# Patient Record
Sex: Male | Born: 1970
Health system: Southern US, Community
[De-identification: ages and names within clinical notes are randomized; demographics above are authoritative.]

## PROBLEM LIST (undated history)

## (undated) DIAGNOSIS — I509 Heart failure, unspecified: Secondary | ICD-10-CM

---

## 2014-07-13 ENCOUNTER — Encounter (HOSPITAL_COMMUNITY): Payer: Self-pay

## 2014-07-13 ENCOUNTER — Emergency Department (HOSPITAL_COMMUNITY): Payer: Self-pay

## 2014-07-13 ENCOUNTER — Encounter (HOSPITAL_COMMUNITY): Payer: Self-pay | Admitting: Emergency Medicine

## 2014-07-13 ENCOUNTER — Emergency Department (INDEPENDENT_AMBULATORY_CARE_PROVIDER_SITE_OTHER)
Admission: EM | Admit: 2014-07-13 | Discharge: 2014-07-13 | Disposition: A | Discharge status: Nursing Home (Includes ICF) | Payer: Self-pay | Source: Home / Self Care | Attending: Emergency Medicine | Admitting: Emergency Medicine

## 2014-07-13 ENCOUNTER — Emergency Department (HOSPITAL_COMMUNITY)
Admission: EM | Admit: 2014-07-13 | Discharge: 2014-07-14 | Disposition: A | Discharge status: Home / Self Care | Payer: Self-pay | Attending: Emergency Medicine | Admitting: Emergency Medicine

## 2014-07-13 DIAGNOSIS — R079 Chest pain, unspecified: Secondary | ICD-10-CM | POA: Insufficient documentation

## 2014-07-13 DIAGNOSIS — R42 Dizziness and giddiness: Secondary | ICD-10-CM | POA: Insufficient documentation

## 2014-07-13 DIAGNOSIS — R0602 Shortness of breath: Secondary | ICD-10-CM

## 2014-07-13 DIAGNOSIS — R0789 Other chest pain: Secondary | ICD-10-CM

## 2014-07-13 DIAGNOSIS — R002 Palpitations: Secondary | ICD-10-CM | POA: Insufficient documentation

## 2014-07-13 LAB — I-STAT TROPONIN, ED: Troponin i, poc: 0.02 ng/mL (ref 0.00–0.08)

## 2014-07-13 LAB — I-STAT CHEM 8, ED
BUN: 21 mg/dL (ref 6–23)
CALCIUM ION: 1.13 mmol/L (ref 1.12–1.23)
Chloride: 103 mmol/L (ref 96–112)
Creatinine, Ser: 1.1 mg/dL (ref 0.50–1.35)
Glucose, Bld: 106 mg/dL — ABNORMAL HIGH (ref 70–99)
HCT: 40 % (ref 39.0–52.0)
HEMOGLOBIN: 13.6 g/dL (ref 13.0–17.0)
Potassium: 3.8 mmol/L (ref 3.5–5.1)
Sodium: 141 mmol/L (ref 135–145)
TCO2: 23 mmol/L (ref 0–100)

## 2014-07-13 LAB — CBC WITH DIFFERENTIAL/PLATELET
BASOS PCT: 0 % (ref 0–1)
Basophils Absolute: 0 10*3/uL (ref 0.0–0.1)
EOS ABS: 0.2 10*3/uL (ref 0.0–0.7)
Eosinophils Relative: 3 % (ref 0–5)
HEMATOCRIT: 40.5 % (ref 39.0–52.0)
Hemoglobin: 13.6 g/dL (ref 13.0–17.0)
Lymphocytes Relative: 26 % (ref 12–46)
Lymphs Abs: 1.8 10*3/uL (ref 0.7–4.0)
MCH: 28.5 pg (ref 26.0–34.0)
MCHC: 33.6 g/dL (ref 30.0–36.0)
MCV: 84.9 fL (ref 78.0–100.0)
MONO ABS: 0.5 10*3/uL (ref 0.1–1.0)
Monocytes Relative: 8 % (ref 3–12)
Neutro Abs: 4.4 10*3/uL (ref 1.7–7.7)
Neutrophils Relative %: 63 % (ref 43–77)
Platelets: 189 10*3/uL (ref 150–400)
RBC: 4.77 MIL/uL (ref 4.22–5.81)
RDW: 12.8 % (ref 11.5–15.5)
WBC: 6.8 10*3/uL (ref 4.0–10.5)

## 2014-07-13 LAB — HEPATIC FUNCTION PANEL
ALBUMIN: 3.9 g/dL (ref 3.5–5.2)
ALT: 18 U/L (ref 0–53)
AST: 15 U/L (ref 0–37)
Alkaline Phosphatase: 37 U/L — ABNORMAL LOW (ref 39–117)
Bilirubin, Direct: <0.1 mg/dL (ref 0.0–0.5)
Total Bilirubin: 0.7 mg/dL (ref 0.3–1.2)
Total Protein: 7.1 g/dL (ref 6.0–8.3)

## 2014-07-13 NOTE — ED Notes (Signed)
Per EMS: Pt from urgent care, began experiencing chest pain this morning ~10am. States that it starts in his LLQ abdominally and extends through chest and into neck. Complaining of 3/10 chest tightness at this time. No history. BP 148/96, 81bpm, sats 100% on RA.

## 2014-07-13 NOTE — ED Provider Notes (Signed)
CSN: [id number]     Arrival date & time 07/13/14  1946 History   First MD Initiated Contact with Patient 07/13/14 2002     Chief Complaint  Patient presents with  . Chest Pain     (Consider location/radiation/quality/duration/timing/severity/associated sxs/prior Treatment) HPI Comments: This is a 44 year old male with no past medical history who presents with 2 episodes of left chest pain that starts in the left upper quadrant, radiates into his left chest and into his neck.  He states it is a tightness during the episode which lasted approximately one hour.  He felt dizzy and short of breath.  He was not nauseated or diaphoretic.  The first episode occurred at 10 AM it lasted approximately one hour.  It resolved after he ate a meal.  Second episode occurred between 3 and 4 lasted approximately one hour and resolved spontaneously after one hour.  At this time is not having any pain He did go to urgent care who did an EKG and sent patient to the ED for further evaluation. Patient's family medical Street consists of father with hypertension.  Mother with low blood pressure. Patient states he has not traveled recently.  He is a former smoker, quit over a year ago.  Has not traveled, has not had swelling of his lower legs.  Patient is a 44 y.o. male presenting with chest pain. The history is provided by the patient.  Chest Pain Pain location:  L chest Pain quality: tightness   Radiates to: neck. Pain radiates to the back: no   Pain severity:  Moderate Onset quality:  Gradual Duration:  1 hour Timing:  Intermittent Progression:  Resolved Chronicity:  Recurrent Relieved by:  Rest Worsened by:  Nothing tried Ineffective treatments:  None tried Associated symptoms: dizziness, palpitations and shortness of breath   Associated symptoms: no back pain, no cough, no diaphoresis, no headache, no heartburn, no nausea, no near-syncope, no numbness and no weakness   Shortness of breath:    Severity:   Mild   Onset quality:  Unable to specify   Duration:  1 hour   Timing:  Intermittent   Progression:  Resolved Risk factors: male sex   Risk factors: no aortic disease, no coronary artery disease, no diabetes mellitus, no high cholesterol, no hypertension, no immobilization, no Marfan's syndrome, not obese, no prior DVT/PE, no smoking and no surgery     History reviewed. No pertinent past medical history. History reviewed. No pertinent past surgical history. History reviewed. No pertinent family history. History  Substance Use Topics  . Smoking status: Former Research scientist (life sciences)  . Smokeless tobacco: Not on file  . Alcohol Use: No    Review of Systems  Constitutional: Negative for diaphoresis.  Respiratory: Positive for shortness of breath. Negative for cough.   Cardiovascular: Positive for chest pain and palpitations. Negative for near-syncope.  Gastrointestinal: Negative for heartburn and nausea.  Musculoskeletal: Negative for back pain.  Skin: Negative for rash.  Neurological: Positive for dizziness. Negative for weakness, numbness and headaches.  All other systems reviewed and are negative.     Allergies  Review of patient's allergies indicates no known allergies.  Home Medications   Prior to Admission medications   Not on File   BP 97/59 mmHg  Pulse 89  Temp(Src) 98.3 F (36.8 C) (Oral)  Resp 21  SpO2 95% Physical Exam  Constitutional: He appears well-developed and well-nourished.  HENT:  Head: Normocephalic.  Eyes: Pupils are equal, round, and reactive to light.  Neck:  Normal range of motion.  Cardiovascular: Normal rate and regular rhythm.   Pulmonary/Chest: Effort normal.  Musculoskeletal: Normal range of motion.  Neurological: He is alert.  Skin: Skin is warm.  Nursing note and vitals reviewed.   ED Course  Procedures (including critical care time) Labs Review Labs Reviewed  HEPATIC FUNCTION PANEL - Abnormal; Notable for the following:    Alkaline  Phosphatase 37 (*)    All other components within normal limits  I-STAT CHEM 8, ED - Abnormal; Notable for the following:    Glucose, Bld 106 (*)    All other components within normal limits  CBC WITH DIFFERENTIAL/PLATELET  Randolm Idol, ED  Randolm Idol, ED    Imaging Review Dg Chest 2 View  07/13/2014   CLINICAL DATA:  Shortness of breath, left-sided chest pressure. Former smoker.  EXAM: CHEST  2 VIEW  COMPARISON:  None.  FINDINGS: Enlarged cardiac silhouette. Normal mediastinal contours the lungs are hyperexpanded with diffuse slightly nodular thickening of the pulmonary interstitium. No discrete focal airspace opacities. No pleural effusion or pneumothorax. No evidence of edema. No acute osseus abnormalities. Mild scoliotic curvature of the thoracolumbar spine, presumably positional.  IMPRESSION: Cardiomegaly and lung hyperexpansion without acute cardiopulmonary disease.   Electronically Signed   By: Sandi Mariscal M.D.   On: 07/13/2014 20:59     EKG Interpretation None     ED ECG REPORT   Date: 07/13/2014  Rate: 90  Rhythm: normal sinus rhythm  QRS Axis: right  Intervals: normal  ST/T Wave abnormalities: normal  Conduction Disutrbances:none  Narrative Interpretation: LVH   Old EKG Reviewed: none available  I have personally reviewed the EKG tracing and agree with the computerized printout as noted. Heart score   3 [facility] states they heard a heart  murmer  Patient has had 2 sets of negative cardiac markers, EKG has been unchanged.  Chest x-ray is within normal parameters.  He has been referred to the [facility] to help establish a primary care physician MDM   Final diagnoses:  Short of breath on exertion  Chest pain of uncertain etiology         Junius Creamer, NP 07/14/14 [place], MD 07/14/14 [phone]

## 2014-07-13 NOTE — ED Provider Notes (Signed)
CSN: [id number]     Arrival date & time 07/13/14  1756 History   None    Chief Complaint  Patient presents with  . Chest Pain  . Weakness  . Shortness of Breath  . Dizziness   (Consider location/radiation/quality/duration/timing/severity/associated sxs/prior Treatment) HPI  He is a 44 year old man here for evaluation of chest pain. He reports left-sided chest pressure associated with shortness of breath and dizziness. He also describes a tight feeling in his neck with this episode. This occurred at 10 AM when he was walking around the car a lot at work. It improved when he sat down and rested. It recurred this afternoon around 3 or 4:00 when he was up working on a car. He had an episode of left-sided chest pressure several months ago, but it was not associated with shortness of breath or dizziness. There is been no recent injury or trauma. He does report a history of some heartburn, but states this feels different. He has taken 2 baby aspirin today. He has no medical history. He is a former smoker, he quit 2 years ago. No alcohol use. No family history of cardiac disease.  History reviewed. No pertinent past medical history. No past surgical history on file. History reviewed. No pertinent family history. History  Substance Use Topics  . Smoking status: Not on file  . Smokeless tobacco: Not on file  . Alcohol Use: Not on file    Review of Systems As in history of present illness Allergies  Review of patient's allergies indicates no known allergies.  Home Medications   Prior to Admission medications   Not on File   BP 155/105 mmHg  Pulse 92  Resp 22  SpO2 100% Physical Exam  Constitutional: He is oriented to person, place, and time. He appears well-developed and well-nourished. No distress.  Neck: Neck supple.  Cardiovascular: Normal rate and regular rhythm.   Murmur (holosystolic murmur heard at left lower sternal border) heard. Pulmonary/Chest: Effort normal and breath  sounds normal. No respiratory distress. He has no wheezes. He has no rales.  Abdominal: Soft. There is no tenderness.  Neurological: He is alert and oriented to person, place, and time.    ED Course  Procedures (including critical care time) ED ECG REPORT   Date: 07/13/2014  Rate: 90  Rhythm: normal sinus rhythm  QRS Axis: normal  Intervals: normal  ST/T Wave abnormalities: nonspecific T wave changes - inversion in V5 and V6  Conduction Disutrbances:none  Narrative Interpretation: abnormal ekg with inverted T-waves in lateral leads  Old EKG Reviewed: none available  I have personally reviewed the EKG tracing and agree with the computerized printout as noted.  Labs Review Labs Reviewed - No data to display  Imaging Review No results found.   MDM   1. Chest pain, unspecified chest pain type   2. Shortness of breath    History is concerning for cardiac etiology of chest pain. His EKG has T-wave inversions in V5 and V6. I have no prior EKG to compare this to. He also has a murmur today, without any history of heart murmur. We'll start an IV and placed on cardiac monitor. Transfer to [facility] via EMS for cardiac rule out.    Melony Overly, MD 07/13/14 [phone]

## 2014-07-13 NOTE — ED Provider Notes (Signed)
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 He is presently asymptomatic. No distress lungs clear auscultation heart regular rate and rhythm no murmurs. Current risk factors include hypertension, ex-smoker otherwise negative. Heart score equals 2-3 based on history, risk factors and EKG. Patient suitable for outpatient evaluation.  Orlie Dakin, MD 07/13/14 2138

## 2014-07-13 NOTE — ED Notes (Addendum)
Pt has had a tightness in his left lower chest since 1000 this morning.  He states the tightness goes up into his neck and is associated with shortness of breath and lightheadedness.  He reports a similar episode about 6 months ago, but it wasn't as bad.  Pt is a little diaphoretic.

## 2014-07-13 NOTE — ED Notes (Signed)
Pt will be transferred to the ED via EMS for further evaluation. Report called to charge nurse Jacqlyn Larsen

## 2014-07-14 LAB — I-STAT TROPONIN, ED: TROPONIN I, POC: 0.02 ng/mL (ref 0.00–0.08)

## 2014-07-14 NOTE — Discharge Instructions (Signed)
Chest Pain (Nonspecific) It is often hard to give a diagnosis for the cause of chest pain. There is always a chance that your pain could be related to something serious, such as a heart attack or a blood clot in the lungs. You need to follow up with your doctor. HOME CARE  If antibiotic medicine was given, take it as directed by your doctor. Finish the medicine even if you start to feel better.  For the next few days, avoid activities that bring on chest pain. Continue physical activities as told by your doctor.  Do not use any tobacco products. This includes cigarettes, chewing tobacco, and e-cigarettes.  Avoid drinking alcohol.  Only take medicine as told by your doctor.  Follow your doctor's suggestions for more testing if your chest pain does not go away.  Keep all doctor visits you made. GET HELP IF:  Your chest pain does not go away, even after treatment.  You have a rash with blisters on your chest.  You have a fever. GET HELP RIGHT AWAY IF:   You have more pain or pain that spreads to your arm, neck, jaw, back, or belly (abdomen).  You have shortness of breath.  You cough more than usual or cough up blood.  You have very bad back or belly pain.  You feel sick to your stomach (nauseous) or throw up (vomit).  You have very bad weakness.  You pass out (faint).  You have chills. This is an emergency. Do not wait to see if the problems will go away. Call your local emergency services (911 in [place] Do not drive yourself to the hospital. MAKE SURE YOU:   Understand these instructions.  Will watch your condition.  Will get help right away if you are not doing well or get worse. Document Released: 08/23/2007 Document Revised: 03/11/2013 Document Reviewed: 08/23/2007 [facility] Patient Information 2015 [place], [place]. This information is not intended to replace advice given to you by your health care provider. Make sure you discuss any questions you have with your  health care provider. Today he had 2 sets of negative cardiac markers, which is reassuring.  EKG has been unchanged during your visit here.  Chest x-ray is normal.  The remainder of the labs are all within normal parameters.  You have been referred to the [facility] to help you establish a primary care physician.  Please document any further episodes or future episodes of this chest discomfort that you have noting what she would doing at the time food consumption along it lasts, what to do to alleviate the discomfort

## 2014-07-20 ENCOUNTER — Ambulatory Visit: Payer: Self-pay | Attending: Family Medicine | Admitting: Family Medicine

## 2014-07-20 ENCOUNTER — Encounter: Payer: Self-pay | Admitting: Family Medicine

## 2014-07-20 VITALS — BP 149/95 | HR 85 | Temp 98.6°F | Resp 18 | Ht 68.0 in | Wt 181.0 lb

## 2014-07-20 DIAGNOSIS — I10 Essential (primary) hypertension: Secondary | ICD-10-CM | POA: Insufficient documentation

## 2014-07-20 DIAGNOSIS — H6123 Impacted cerumen, bilateral: Secondary | ICD-10-CM | POA: Insufficient documentation

## 2014-07-20 DIAGNOSIS — I517 Cardiomegaly: Secondary | ICD-10-CM | POA: Insufficient documentation

## 2014-07-20 DIAGNOSIS — F17200 Nicotine dependence, unspecified, uncomplicated: Secondary | ICD-10-CM | POA: Insufficient documentation

## 2014-07-20 DIAGNOSIS — R0602 Shortness of breath: Secondary | ICD-10-CM | POA: Insufficient documentation

## 2014-07-20 DIAGNOSIS — K219 Gastro-esophageal reflux disease without esophagitis: Secondary | ICD-10-CM | POA: Insufficient documentation

## 2014-07-20 DIAGNOSIS — Z09 Encounter for follow-up examination after completed treatment for conditions other than malignant neoplasm: Secondary | ICD-10-CM | POA: Insufficient documentation

## 2014-07-20 MED ORDER — CARBAMIDE PEROXIDE 6.5 % OT SOLN: 5.0000 [drp] | Freq: Two times a day (BID) | OTIC | Status: DC

## 2014-07-20 MED ORDER — ALBUTEROL SULFATE HFA 108 (90 BASE) MCG/ACT IN AERS: 2.0000 | INHALATION_SPRAY | Freq: Four times a day (QID) | RESPIRATORY_TRACT | Status: DC | PRN

## 2014-07-20 MED ORDER — OMEPRAZOLE 20 MG PO CPDR: 20.0000 mg | DELAYED_RELEASE_CAPSULE | Freq: Every day | ORAL | Status: DC

## 2014-07-20 MED ORDER — AMLODIPINE BESYLATE 5 MG PO TABS: 5.0000 mg | ORAL_TABLET | Freq: Every day | ORAL | Status: DC

## 2014-07-20 NOTE — Patient Instructions (Signed)
Jerome Barnett,  Thank you for coming in today. It was a pleasure meeting you. I look forward to being your primary doctor.  1. Ear wax: debrox drops in both ears for 5 days   2. Elevated blood pressure: norvasc 5 mg daily   3. SOB with chest tightness: albuterol as needed  4. GERD: Eat smaller meals Avoid common triggers Prilosec 20 mg 30 minutes before supper   F/u with nurse for BP check in 4 weeks  F/u with me for HTN and SOB in 2 months   Dr. Adrian Blackwater

## 2014-07-20 NOTE — Assessment & Plan Note (Signed)
SOB with chest tightness:  In patient with long smoking history albuterol as needed  4. GERD: Eat smaller meals Avoid common triggers Prilosec 20 mg 30 minutes before supper

## 2014-07-20 NOTE — Assessment & Plan Note (Signed)
Ear wax: debrox drops in both ears for 5 days

## 2014-07-20 NOTE — Assessment & Plan Note (Signed)
A: HTN with cardiomegaly on CXR P: norvasc 5 mg daily

## 2014-07-20 NOTE — Progress Notes (Signed)
   Subjective:    Patient ID: Jerome Barnett, male    DOB: 09-01-1970, 44 y.o.   MRN: [id number] CC: ED f/u for CP, pain free, elevated BP  HPI  1. ED f/u CP: patient with chest tightness and SOB last week. Went to ED. See note for details. Had negative troponins. No longer with chest tightness or SOB. Has hx of GERD. GERD CP is different and relieved with belching. Admits to manageable life stressors with wife and work. He has no children.   2. HTN: BP elevated off and on. No hx of BP meds. Had cardiomegaly on CXR done in ED.   Soc Hx: former heavy smoker Med Hx: elevated BP, GERD Surg Hx: negative  Review of Systems  Constitutional: Negative.   HENT: Negative.   Eyes: Negative.   Respiratory: Positive for cough, chest tightness and shortness of breath. Negative for apnea, choking, wheezing and stridor.   Cardiovascular: Negative.   Gastrointestinal: Negative.   Allergic/Immunologic: Negative.   Neurological: Negative.   Hematological: Negative.   Psychiatric/Behavioral: Negative.        Objective:   Physical Exam BP 149/95 mmHg  Pulse 85  Temp(Src) 98.6 F (37 C) (Oral)  Resp 18  Ht 5\' 8"  (1.727 m)  Wt 181 lb (82.101 kg)  BMI 27.53 kg/m2  SpO2 98%  BP Readings from Last 3 Encounters:  07/20/14 149/95  07/14/14 97/59  07/13/14 155/105   General appearance: alert, cooperative and no distress Head: Normocephalic, without obvious abnormality, atraumatic Eyes: conjunctivae/corneas clear. PERRL, EOM's intact.  Ears: abnormal external canal right ear - ceruminosis impacting canal and abnormal external canal left ear - ceruminosis impacting canal Nose: no discharge, turbinates pink, swollen Throat: lips, mucosa, and tongue normal; teeth and gums normal Neck: no adenopathy, supple, symmetrical, trachea midline and thyroid not enlarged, symmetric, no tenderness/mass/nodules Lungs: clear to auscultation bilaterally Heart: regular rate and rhythm, S1, S2 normal, no  murmur, click, rub or gallop  Abdomen: normal exam, flat, soft, NT  Extremities: extremities normal, atraumatic, no cyanosis or edema       Assessment & Plan:

## 2014-07-20 NOTE — Assessment & Plan Note (Signed)
A: GERD P: prilosec daily Avoid triggers Avoid large meals

## 2014-08-26 ENCOUNTER — Ambulatory Visit: Payer: Self-pay | Attending: Family Medicine | Admitting: *Deleted

## 2014-08-26 VITALS — BP 146/98 | HR 99 | Temp 98.0°F | Resp 16 | Ht 68.0 in | Wt 177.0 lb

## 2014-08-26 DIAGNOSIS — I10 Essential (primary) hypertension: Secondary | ICD-10-CM

## 2014-08-26 NOTE — Progress Notes (Signed)
Patient presents for BP check after starting amlodipine 5 mg; took last dose 2 days ago Patient states he is taking med approx 4 days per week. States he feels dizzy after taking and doesn't want to take before driving. Travels 75 minutes for work. Patient takes when he arrives to work unless he is late due to traffic. Discussed taking med at bedtime to increase adherence and decrease side effect. Patient states he will try this. Discussed need for low sodium diet and using Mrs. Dash as alternative to salt Encouraged to choose foods with 5% or less of daily value for sodium. Walking 15-20 minutes on treadmill twice daily for exercise. Mountain biking 20 miles on weekends Patient denies blurred vision, SHOB, chest pain  Positive for headaches. States this happens when he misses meals; patient is fasting for Ramadan  Filed Vitals:   08/26/14 1443  BP: 146/98  Pulse: 99  Temp: 98 F (36.7 C)  Resp: 16    Patient to return in 1-2 weeks for nurse visit for BP check. Encouraged pt to make AM appt and ensure he takes amlodipine night before.  Patient given literature on DASH Eating Plan

## 2014-08-26 NOTE — Patient Instructions (Signed)
DASH Eating Plan °DASH stands for "Dietary Approaches to Stop Hypertension." The DASH eating plan is a healthy eating plan that has been shown to reduce high blood pressure (hypertension). Additional health benefits may include reducing the risk of type 2 diabetes mellitus, heart disease, and stroke. The DASH eating plan may also help with weight loss. °WHAT DO I NEED TO KNOW ABOUT THE DASH EATING PLAN? °For the DASH eating plan, you will follow these general guidelines: °· Choose foods with a percent daily value for sodium of less than 5% (as listed on the food label). °· Use salt-free seasonings or herbs instead of table salt or sea salt. °· Check with your health care provider or pharmacist before using salt substitutes. °· Eat lower-sodium products, often labeled as "lower sodium" or "no salt added." °· Eat fresh foods. °· Eat more vegetables, fruits, and low-fat dairy products. °· Choose whole grains. Look for the word "whole" as the first word in the ingredient list. °· Choose fish and skinless chicken or turkey more often than red meat. Limit fish, poultry, and meat to 6 oz (170 g) each day. °· Limit sweets, desserts, sugars, and sugary drinks. °· Choose heart-healthy fats. °· Limit cheese to 1 oz (28 g) per day. °· Eat more home-cooked food and less restaurant, buffet, and fast food. °· Limit fried foods. °· Cook foods using methods other than frying. °· Limit canned vegetables. If you do use them, rinse them well to decrease the sodium. °· When eating at a restaurant, ask that your food be prepared with less salt, or no salt if possible. °WHAT FOODS CAN I EAT? °Seek help from a dietitian for individual calorie needs. °Grains °Whole grain or whole wheat bread. Brown rice. Whole grain or whole wheat pasta. Quinoa, bulgur, and whole grain cereals. Low-sodium cereals. Corn or whole wheat flour tortillas. Whole grain cornbread. Whole grain crackers. Low-sodium crackers. °Vegetables °Fresh or frozen vegetables  (raw, steamed, roasted, or grilled). Low-sodium or reduced-sodium tomato and vegetable juices. Low-sodium or reduced-sodium tomato sauce and paste. Low-sodium or reduced-sodium canned vegetables.  °Fruits °All fresh, canned (in natural juice), or frozen fruits. °Meat and Other Protein Products °Ground beef (85% or leaner), grass-fed beef, or beef trimmed of fat. Skinless chicken or turkey. Ground chicken or turkey. Pork trimmed of fat. All fish and seafood. Eggs. Dried beans, peas, or lentils. Unsalted nuts and seeds. Unsalted canned beans. °Dairy °Low-fat dairy products, such as skim or 1% milk, 2% or reduced-fat cheeses, low-fat ricotta or cottage cheese, or plain low-fat yogurt. Low-sodium or reduced-sodium cheeses. °Fats and Oils °Tub margarines without trans fats. Light or reduced-fat mayonnaise and salad dressings (reduced sodium). Avocado. Safflower, olive, or canola oils. Natural peanut or almond butter. °Other °Unsalted popcorn and pretzels. °The items listed above may not be a complete list of recommended foods or beverages. Contact your dietitian for more options. °WHAT FOODS ARE NOT RECOMMENDED? °Grains °White bread. White pasta. White rice. Refined cornbread. Bagels and croissants. Crackers that contain trans fat. °Vegetables °Creamed or fried vegetables. Vegetables in a cheese sauce. Regular canned vegetables. Regular canned tomato sauce and paste. Regular tomato and vegetable juices. °Fruits °Dried fruits. Canned fruit in light or heavy syrup. Fruit juice. °Meat and Other Protein Products °Fatty cuts of meat. Ribs, chicken wings, bacon, sausage, bologna, salami, chitterlings, fatback, hot dogs, bratwurst, and packaged luncheon meats. Salted nuts and seeds. Canned beans with salt. °Dairy °Whole or 2% milk, cream, half-and-half, and cream cheese. Whole-fat or sweetened yogurt. Full-fat   cheeses or blue cheese. Nondairy creamers and whipped toppings. Processed cheese, cheese spreads, or cheese  curds. °Condiments °Onion and garlic salt, seasoned salt, table salt, and sea salt. Canned and packaged gravies. Worcestershire sauce. Tartar sauce. Barbecue sauce. Teriyaki sauce. Soy sauce, including reduced sodium. Steak sauce. Fish sauce. Oyster sauce. Cocktail sauce. Horseradish. Ketchup and mustard. Meat flavorings and tenderizers. Bouillon cubes. Hot sauce. Tabasco sauce. Marinades. Taco seasonings. Relishes. °Fats and Oils °Butter, stick margarine, lard, shortening, ghee, and bacon fat. Coconut, palm kernel, or palm oils. Regular salad dressings. °Other °Pickles and olives. Salted popcorn and pretzels. °The items listed above may not be a complete list of foods and beverages to avoid. Contact your dietitian for more information. °WHERE CAN I FIND MORE INFORMATION? °National Heart, Lung, and [employer] Institute: www.nhlbi.nih.gov/health/health-topics/topics/dash/ °Document Released: 02/23/2011 Document Revised: 07/21/2013 Document Reviewed: 01/08/2013 °Exit[employer]® Patient Information ©2015 [employer]. This information is not intended to replace advice given to you by your health care provider. Make sure you discuss any questions you have with your health care provider. ° °

## 2014-09-25 ENCOUNTER — Ambulatory Visit: Payer: Self-pay | Attending: Family Medicine | Admitting: *Deleted

## 2014-09-25 VITALS — BP 138/92 | HR 93 | Temp 97.9°F | Resp 20 | Ht 68.0 in | Wt 175.8 lb

## 2014-09-25 DIAGNOSIS — I10 Essential (primary) hypertension: Secondary | ICD-10-CM | POA: Insufficient documentation

## 2014-09-25 MED ORDER — AMLODIPINE BESYLATE 10 MG PO TABS: 10.0000 mg | ORAL_TABLET | Freq: Every day | ORAL | Status: DC

## 2014-09-25 NOTE — Progress Notes (Signed)
Patient presents for BP check after changing amplodipine 5 mg from AM dosing to PM Still adding salt to foods. Patient states that he knows he needs to stop Walking 30 minutes daily during Ramadan. Will resume normal physical activity now that fasting has ended Patient denies headaches, blurred vision, SHOB, chest pain   Filed Vitals:   09/25/14 0929  BP: 138/92  Pulse: 93  Temp: 97.9 F (36.6 C)  Resp: 20    Per PCP: Increase amlodipine to 10 mg daily f/u with PCP in 4 weeks  Patient advised to call for med refills at least 7 days before running out so as not to go without.

## 2015-07-02 ENCOUNTER — Encounter (HOSPITAL_COMMUNITY): Payer: Self-pay | Admitting: *Deleted

## 2015-07-02 ENCOUNTER — Inpatient Hospital Stay (HOSPITAL_COMMUNITY)
Admission: EM | Admit: 2015-07-02 | Discharge: 2015-07-08 | DRG: 286 | Disposition: A | Discharge status: Home / Self Care | Payer: Self-pay | Attending: Internal Medicine | Admitting: Internal Medicine

## 2015-07-02 ENCOUNTER — Observation Stay (HOSPITAL_COMMUNITY): Payer: Self-pay

## 2015-07-02 ENCOUNTER — Emergency Department (HOSPITAL_COMMUNITY): Payer: Self-pay

## 2015-07-02 DIAGNOSIS — K219 Gastro-esophageal reflux disease without esophagitis: Secondary | ICD-10-CM | POA: Diagnosis present

## 2015-07-02 DIAGNOSIS — I739 Peripheral vascular disease, unspecified: Secondary | ICD-10-CM | POA: Diagnosis present

## 2015-07-02 DIAGNOSIS — Z8249 Family history of ischemic heart disease and other diseases of the circulatory system: Secondary | ICD-10-CM

## 2015-07-02 DIAGNOSIS — G459 Transient cerebral ischemic attack, unspecified: Secondary | ICD-10-CM | POA: Diagnosis present

## 2015-07-02 DIAGNOSIS — R29898 Other symptoms and signs involving the musculoskeletal system: Secondary | ICD-10-CM

## 2015-07-02 DIAGNOSIS — B3324 Viral cardiomyopathy: Secondary | ICD-10-CM | POA: Diagnosis present

## 2015-07-02 DIAGNOSIS — T501X5A Adverse effect of loop [high-ceiling] diuretics, initial encounter: Secondary | ICD-10-CM | POA: Diagnosis not present

## 2015-07-02 DIAGNOSIS — E785 Hyperlipidemia, unspecified: Secondary | ICD-10-CM | POA: Diagnosis present

## 2015-07-02 DIAGNOSIS — J9600 Acute respiratory failure, unspecified whether with hypoxia or hypercapnia: Secondary | ICD-10-CM | POA: Diagnosis present

## 2015-07-02 DIAGNOSIS — G4733 Obstructive sleep apnea (adult) (pediatric): Secondary | ICD-10-CM | POA: Diagnosis present

## 2015-07-02 DIAGNOSIS — H578 Other specified disorders of eye and adnexa: Secondary | ICD-10-CM | POA: Diagnosis not present

## 2015-07-02 DIAGNOSIS — F40240 Claustrophobia: Secondary | ICD-10-CM | POA: Diagnosis present

## 2015-07-02 DIAGNOSIS — I5023 Acute on chronic systolic (congestive) heart failure: Secondary | ICD-10-CM | POA: Insufficient documentation

## 2015-07-02 DIAGNOSIS — Z79899 Other long term (current) drug therapy: Secondary | ICD-10-CM

## 2015-07-02 DIAGNOSIS — R0602 Shortness of breath: Secondary | ICD-10-CM

## 2015-07-02 DIAGNOSIS — Z7982 Long term (current) use of aspirin: Secondary | ICD-10-CM

## 2015-07-02 DIAGNOSIS — Z87891 Personal history of nicotine dependence: Secondary | ICD-10-CM

## 2015-07-02 DIAGNOSIS — R079 Chest pain, unspecified: Secondary | ICD-10-CM

## 2015-07-02 DIAGNOSIS — I110 Hypertensive heart disease with heart failure: Principal | ICD-10-CM | POA: Diagnosis present

## 2015-07-02 DIAGNOSIS — R570 Cardiogenic shock: Secondary | ICD-10-CM | POA: Diagnosis not present

## 2015-07-02 DIAGNOSIS — G44209 Tension-type headache, unspecified, not intractable: Secondary | ICD-10-CM | POA: Diagnosis not present

## 2015-07-02 DIAGNOSIS — I10 Essential (primary) hypertension: Secondary | ICD-10-CM | POA: Diagnosis present

## 2015-07-02 DIAGNOSIS — I5043 Acute on chronic combined systolic (congestive) and diastolic (congestive) heart failure: Secondary | ICD-10-CM | POA: Diagnosis present

## 2015-07-02 DIAGNOSIS — I5021 Acute systolic (congestive) heart failure: Secondary | ICD-10-CM | POA: Insufficient documentation

## 2015-07-02 DIAGNOSIS — R7303 Prediabetes: Secondary | ICD-10-CM | POA: Diagnosis present

## 2015-07-02 DIAGNOSIS — T463X5A Adverse effect of coronary vasodilators, initial encounter: Secondary | ICD-10-CM | POA: Diagnosis not present

## 2015-07-02 LAB — HEPATIC FUNCTION PANEL
ALBUMIN: 4 g/dL (ref 3.5–5.0)
ALK PHOS: 50 U/L (ref 38–126)
ALT: 38 U/L (ref 17–63)
AST: 25 U/L (ref 15–41)
Bilirubin, Direct: 0.2 mg/dL (ref 0.1–0.5)
Indirect Bilirubin: 1.2 mg/dL — ABNORMAL HIGH (ref 0.3–0.9)
TOTAL PROTEIN: 6.7 g/dL (ref 6.5–8.1)
Total Bilirubin: 1.4 mg/dL — ABNORMAL HIGH (ref 0.3–1.2)

## 2015-07-02 LAB — LIPID PANEL
Cholesterol: 239 mg/dL — ABNORMAL HIGH (ref 0–200)
HDL: 43 mg/dL (ref >40)
LDL Cholesterol: 172 mg/dL — ABNORMAL HIGH (ref 0–99)
Total CHOL/HDL Ratio: 5.6 RATIO
Triglycerides: 122 mg/dL (ref <150)
VLDL: 24 mg/dL (ref 0–40)

## 2015-07-02 LAB — TSH: TSH: 0.91 u[IU]/mL (ref 0.350–4.500)

## 2015-07-02 LAB — I-STAT TROPONIN, ED
TROPONIN I, POC: 0.04 ng/mL (ref 0.00–0.08)
TROPONIN I, POC: 0.02 ng/mL (ref 0.00–0.08)

## 2015-07-02 LAB — CBC
HCT: 40 % (ref 39.0–52.0)
Hemoglobin: 13.2 g/dL (ref 13.0–17.0)
MCH: 27.6 pg (ref 26.0–34.0)
MCHC: 33 g/dL (ref 30.0–36.0)
MCV: 83.7 fL (ref 78.0–100.0)
Platelets: 226 10*3/uL (ref 150–400)
RBC: 4.78 MIL/uL (ref 4.22–5.81)
RDW: 12.9 % (ref 11.5–15.5)
WBC: 5 10*3/uL (ref 4.0–10.5)

## 2015-07-02 LAB — BRAIN NATRIURETIC PEPTIDE: B NATRIURETIC PEPTIDE 5: 545.5 pg/mL — AB (ref 0.0–100.0)

## 2015-07-02 LAB — BASIC METABOLIC PANEL
ANION GAP: 11 (ref 5–15)
BUN: 14 mg/dL (ref 6–20)
CO2: 23 mmol/L (ref 22–32)
Calcium: 9.4 mg/dL (ref 8.9–10.3)
Chloride: 107 mmol/L (ref 101–111)
Creatinine, Ser: 1.18 mg/dL (ref 0.61–1.24)
GFR calc Af Amer: >60 mL/min (ref >60)
GLUCOSE: 98 mg/dL (ref 65–99)
Potassium: 3.9 mmol/L (ref 3.5–5.1)
Sodium: 141 mmol/L (ref 135–145)

## 2015-07-02 LAB — TROPONIN I
TROPONIN I: 0.04 ng/mL — AB (ref <0.031)
Troponin I: 0.05 ng/mL — ABNORMAL HIGH (ref <0.031)

## 2015-07-02 LAB — D-DIMER, QUANTITATIVE (NOT AT ARMC): D DIMER QUANT: 0.34 ug{FEU}/mL (ref 0.00–0.50)

## 2015-07-02 MED ORDER — ALBUTEROL SULFATE (2.5 MG/3ML) 0.083% IN NEBU: 3.0000 mL | INHALATION_SOLUTION | Freq: Four times a day (QID) | RESPIRATORY_TRACT | Status: DC | PRN

## 2015-07-02 MED ORDER — PANTOPRAZOLE SODIUM 40 MG PO TBEC
40.0000 mg | DELAYED_RELEASE_TABLET | Freq: Every day | ORAL | Status: DC
  Administered 2015-07-02 – 2015-07-08 (×7): 40 mg via ORAL
  Filled 2015-07-02 (×7): qty 1

## 2015-07-02 MED ORDER — SODIUM CHLORIDE 0.9% FLUSH
3.0000 mL | Freq: Two times a day (BID) | INTRAVENOUS | Status: DC
  Administered 2015-07-03 – 2015-07-08 (×10): 3 mL via INTRAVENOUS

## 2015-07-02 MED ORDER — NITROGLYCERIN 0.4 MG SL SUBL: 0.4000 mg | SUBLINGUAL_TABLET | SUBLINGUAL | Status: DC | PRN

## 2015-07-02 MED ORDER — ASPIRIN EC 81 MG PO TBEC
81.0000 mg | DELAYED_RELEASE_TABLET | Freq: Every day | ORAL | Status: DC
  Administered 2015-07-03 – 2015-07-07 (×4): 81 mg via ORAL
  Filled 2015-07-02 (×5): qty 1

## 2015-07-02 MED ORDER — FUROSEMIDE 10 MG/ML IJ SOLN
20.0000 mg | Freq: Once | INTRAMUSCULAR | Status: AC
  Administered 2015-07-02: 20 mg via INTRAVENOUS
  Filled 2015-07-02: qty 2

## 2015-07-02 MED ORDER — DIPHENHYDRAMINE HCL 50 MG/ML IJ SOLN
25.0000 mg | Freq: Once | INTRAMUSCULAR | Status: AC
  Administered 2015-07-02: 25 mg via INTRAVENOUS
  Filled 2015-07-02: qty 1

## 2015-07-02 MED ORDER — ASPIRIN 81 MG PO CHEW
324.0000 mg | CHEWABLE_TABLET | Freq: Once | ORAL | Status: AC
  Administered 2015-07-02: 324 mg via ORAL
  Filled 2015-07-02: qty 4

## 2015-07-02 MED ORDER — ENOXAPARIN SODIUM 40 MG/0.4ML ~~LOC~~ SOLN
40.0000 mg | SUBCUTANEOUS | Status: DC
  Administered 2015-07-02 – 2015-07-03 (×2): 40 mg via SUBCUTANEOUS
  Filled 2015-07-02 (×3): qty 0.4

## 2015-07-02 NOTE — H&P (Signed)
Date: 07/02/2015               Patient Name:  Jerome Barnett MRN: [id number]  DOB: 11-03-1970 Age / Sex: 45 y.o., male   PCP: Boykin Nearing, MD         Medical Service: Internal Medicine Teaching Service         Attending Physician: Dr. Bartholomew Crews, MD    First Contact: Dr. Lovena Le Pager: [phone]  Second Contact: Dr. Arcelia Jew Pager: [phone]       After Hours (After 5p/  First Contact Pager: [phone]  weekends / holidays): Second Contact Pager: [phone]   Chief Complaint: SOB, chest pressure  History of Present Illness: Jerome Barnett is a 45 yo male with HTN and GERD, presenting with one week h/o intermittent SOB, chest pressure, and dizziness.  He reports intermittent SOB and dizziness over the last week, sometimes a/w chest pressure.  He reports he is SOB at rest, worse with lying flat and decreased with lying prone or reclined.  He denies leg swelling.  He can only climb one flight of stairs before becoming SOB.  He sleeps on 2 pillows.   His chest pressure is also worst at night, and is a/w wheezing.  He denies palpitations.  He has a h/o HTN, previously controlled, though he has not taken his Amlodipine in ~6 months.  He has a 40 pack-year smoking history (2ppd x 20 years), but quit 2 years ago.  He denies a h/o CHF, HLD, DMII, MI, or stroke.     Today he also complains of small stabbing pains to his sternum a/w worsened SOB and diaphoresis.  He denies associated nausea.  The pain last a few minutes, but recurs.  It is a similar pain to an ED presentation one year ago.    This morning, during an episode of dizziness during which he thought he may lose consciousness, he reports his right arm going limp at his side.  The episode lasted about 5 minutes before resolving.  Additionally, on his way to the ED this afternoon, he noted numbness/tingling of right fingers.  He is still complaining of numbness/tingling, but believes it may now be due to his recent blood draw.  He denies  dysarthria, Jerome focal weakness, LOC, or bowel/bladder incontinence.    He has a questionable history of COPD, which may been mentioned by his PCP.  His wife also reports intermittent episodes recently of the patient not breathing at night.  Patient endorses waking up SOB at night and tiredness during the day.  FHx: No family h/o MI, CHF, or stroke. 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  He has a 40 pack-year smoking history.  He may drink twice a year.  He denies Jerome drugs.  In the ED, patient's troponin and D-dimer were negative. His BNP was elevated to >500.  CXR was notable for chronic bronchitic changes and cardiomegaly.  Meds: Current Facility-Administered Medications  Medication Dose Route Frequency Provider Last Rate Last Dose  . nitroGLYCERIN (NITROSTAT) SL tablet 0.4 mg  0.4 mg Sublingual Q5 min PRN Julianne Rice, MD       Current Outpatient Prescriptions  Medication Sig Dispense Refill  . acetaminophen (TYLENOL) 325 MG tablet Take 650 mg by mouth every 6 (six) hours as needed for mild pain.    Marland Kitchen albuterol (PROVENTIL HFA;VENTOLIN HFA) 108 (90 BASE) MCG/ACT inhaler Inhale 2 puffs into the  lungs every 6 (six) hours as needed for wheezing or shortness of breath. 1 Inhaler 0  . DM-Doxylamine-Acetaminophen (NYQUIL COLD & FLU PO) Take 30 mLs by mouth every 8 (eight) hours as needed (fever).    Marland Kitchen ibuprofen (ADVIL,MOTRIN) 200 MG tablet Take 200 mg by mouth every 6 (six) hours as needed for moderate pain.    Marland Kitchen amLODipine (NORVASC) 10 MG tablet Take 1 tablet (10 mg total) by mouth daily. (Patient not taking: Reported on 07/02/2015) 30 tablet 2  . omeprazole (PRILOSEC) 20 MG capsule Take 1 capsule (20 mg total) by mouth daily. (Patient not taking: Reported on 07/02/2015) 30 capsule 3    Allergies: Allergies as of 07/02/2015  . (No Known Allergies)   History reviewed. No pertinent past medical history. History reviewed. No  pertinent past surgical history. Family History  Problem Relation Age of Onset  . Hypertension Father   . Hyperlipidemia Father    Social History   Social History  . Marital Status: Married    Spouse Name: N/A  . Number of Children: N/A  . Years of Education: N/A   Occupational History  . Not on file.   Social History Main Topics  . Smoking status: Former Smoker -- 2.00 packs/day for 20 years    Quit date: 03/20/2012  . Smokeless tobacco: Never Used  . Alcohol Use: No  . Drug Use: No  . Sexual Activity: Not on file   Jerome Topics Concern  . Not on file   Social History Narrative   Patient is married, no children.   Has pets   Works full time at [employer]     Review of Systems: Pertinent items noted in HPI and remainder of comprehensive ROS otherwise negative.  Physical Exam: Blood pressure 152/99, pulse 96, temperature 98 F (36.7 C), temperature source Oral, resp. rate 18, SpO2 95 %. Physical Exam  Constitutional: He is oriented to person, place, and time and well-developed, well-nourished, and in no distress. No distress.  HENT:  Head: Normocephalic and atraumatic.  Eyes: EOM are normal. Pupils are equal, round, and reactive to light.  Pigmented/Icteric sclerae.  Neck: No tracheal deviation present.  JVD to 2 cm above clavicle at 45 degrees.  Cardiovascular: Regular rhythm and intact distal pulses.   Borderline tachycardia. Heart sounds distant.  No murmurs or gallops appreciated.  Pulmonary/Chest: Effort normal. No stridor. No respiratory distress. He has no wheezes.  Inspiratory crackles at bilateral bases.  Abdominal: Soft. He exhibits no distension. There is no tenderness. There is no rebound and no guarding.  Musculoskeletal: He exhibits no edema.  Neurological: He is alert and oriented to person, place, and time.  CNII-XII intact to bedside testing.  Strength 5/5 in b/l UE and LE.  No dysmetria or pronator drift of upper extremities.  Sensation  intact to light touch and pinprick on bilateral hands.    Skin: Skin is warm. He is not diaphoretic.     Lab results: Basic Metabolic Panel:  Recent Labs  07/02/15 1203  NA 141  K 3.9  CL 107  CO2 23  GLUCOSE 98  BUN 14  CREATININE 1.18  CALCIUM 9.4   Liver Function Tests: No results for input(s): AST, ALT, ALKPHOS, BILITOT, PROT, ALBUMIN in the last 72 hours. No results for input(s): LIPASE, AMYLASE in the last 72 hours. No results for input(s): AMMONIA in the last 72 hours. CBC:  Recent Labs  07/02/15 1203  WBC 5.0  HGB 13.2  HCT 40.0  MCV 83.7  PLT 226   Cardiac Enzymes: No results for input(s): CKTOTAL, CKMB, CKMBINDEX, TROPONINI in the last 72 hours. BNP: No results for input(s): PROBNP in the last 72 hours. D-Dimer:  Recent Labs  07/02/15 1343  DDIMER 0.34   CBG: No results for input(s): GLUCAP in the last 72 hours. Hemoglobin A1C: No results for input(s): HGBA1C in the last 72 hours. Fasting Lipid Panel: No results for input(s): CHOL, HDL, LDLCALC, TRIG, CHOLHDL, LDLDIRECT in the last 72 hours. Thyroid Function Tests: No results for input(s): TSH, T4TOTAL, FREET4, T3FREE, THYROIDAB in the last 72 hours. Anemia Panel: No results for input(s): VITAMINB12, FOLATE, FERRITIN, TIBC, IRON, RETICCTPCT in the last 72 hours. Coagulation: No results for input(s): LABPROT, INR in the last 72 hours. Urine Drug Screen: Drugs of Abuse  No results found for: LABOPIA, COCAINSCRNUR, LABBENZ, AMPHETMU, THCU, LABBARB  Alcohol Level: No results for input(s): ETH in the last 72 hours. Urinalysis: No results for input(s): COLORURINE, LABSPEC, PHURINE, GLUCOSEU, HGBUR, BILIRUBINUR, KETONESUR, PROTEINUR, UROBILINOGEN, NITRITE, LEUKOCYTESUR in the last 72 hours.  Invalid input(s): APPERANCEUR Misc. Labs:   Imaging results:  Dg Chest 2 View  07/02/2015  CLINICAL DATA:  Extreme shortness of breath, central chest pain and dizziness for 4 days, history hypertension,  former smoker, recent dental infection EXAM: CHEST  2 VIEW COMPARISON:  07/13/2014 FINDINGS: Enlargement of cardiac silhouette. Mediastinal contours and pulmonary vascularity normal. Chronic bronchitic changes. No pulmonary infiltrate, pleural effusion or pneumothorax. No acute osseous findings. IMPRESSION: Enlargement of cardiac silhouette. Chronic bronchitic changes without acute abnormality. Electronically Signed   By: Lavonia Dana M.D.   On: 07/02/2015 12:20    Jerome results: EKG: normal sinus rhythm, LVH, RAE.  Assessment & Plan by Problem: Active Problems:   Chest pain  Mr. Roughton is a 45 yo male with HTN and GERD, presenting with one week h/o intermittent SOB, chest pressure, and dizziness.  SOB: Patient presents with orthopnea, inspiratory crackles, elevated BNP, LVH, cardiomegaly, and h/o uncontrolled HTN.  His SOB is most likely 2/2 CHF, likely HFpEF given his LVH and HTN. This is likely worsened by some degree of underlying OSA.  Initial troponin negative and patient not complaining of overt chest pain, so unlikely to be MI.  Negative D-dimer effectively rules out PE.  He has bronchitic changes on CXR and 40 pack year smoking history, but no wheezes on exam, so do not think this is a COPD exacerbation.  Will hold Amlodipine until CT/MRI results. [ ]  Echo [ ]  Troponins [ ]  ECG in AM - Teley - ASA - Nitro - Lasix 20 mg once - Albuterol q6h PRN - HOLD Amlodipine. Restart if CT/MRI negative. - Daily weights. - Strict I/Os  TIA: Patients description of transient arm weakness and numbness/tingling, as well as uncontrolled HTN certainly concerning for TIAs.  No focal findings of neuro exam in the ED, so do not think he is having acute stroke.  No LOC or bowel/bladder incontinence.  No h/o afib, though he has had palpitations in the past.   - Teley [ ]  CT head. MRI brain if CT negative. - ASA - HOLD Amlodipine until CT/MRI results  GERD: Protonix  FEN/GI: - HH  DVT Ppx:  Lovenox   Dispo: Disposition is deferred at this time, awaiting improvement of current medical problems. Anticipated discharge in approximately 1-3 day(s).   The patient does have a current PCP (Boykin Nearing, MD) and does need an [facility] hospital follow-up appointment after discharge.  The  patient does not have transportation limitations that hinder transportation to clinic appointments.  Signed: Iline Oven, MD, PhD 07/02/2015, 4:47 PM

## 2015-07-02 NOTE — ED Notes (Signed)
Patient transported to CT 

## 2015-07-02 NOTE — ED Notes (Signed)
Pt reports recent chest pains and sob. Cp increases with non productive cough. Today is also having dizziness. ekg done, airway intact.

## 2015-07-02 NOTE — ED Notes (Signed)
Attempted report 

## 2015-07-02 NOTE — ED Provider Notes (Signed)
CSN: [id number]     Arrival date & time 07/02/15  1157 History   First MD Initiated Contact with Patient 07/02/15 1210     Chief Complaint  Patient presents with  . Chest Pain  . Shortness of Breath     (Consider location/radiation/quality/duration/timing/severity/associated sxs/prior Treatment) HPI Patient presents with chest tightness, shortness of breath and lightheadedness this been episodic over the last week. Worse with exertion. Denies any new lower extremity swelling or weakness. He states he occasionally has some wheezing in the chest and has a nonproductive cough. No fever or chills. Patient also complains of tingling to the right hand. Planes of mild chest tightness at this time. Patient has been on blood pressure medication past but states he stopped taking it several months ago. Has a 40 pack year smoking history.  History reviewed. No pertinent past medical history. History reviewed. No pertinent past surgical history. Family History  Problem Relation Age of Onset  . Hypertension Father    Social History  Substance Use Topics  . Smoking status: Former Smoker -- 2.00 packs/day for 20 years    Quit date: 03/20/2012  . Smokeless tobacco: Never Used  . Alcohol Use: No    Review of Systems  Constitutional: Negative for fever and chills.  Respiratory: Positive for cough, shortness of breath and wheezing.   Cardiovascular: Positive for chest pain and palpitations. Negative for leg swelling.  Gastrointestinal: Negative for nausea, vomiting, abdominal pain, diarrhea and constipation.  Genitourinary: Negative for dysuria, frequency, hematuria and flank pain.  Musculoskeletal: Negative for back pain, neck pain and neck stiffness.  Skin: Negative for rash and wound.  Neurological: Positive for dizziness, light-headedness and numbness. Negative for weakness and headaches.  Psychiatric/Behavioral: The patient is nervous/anxious.   All other systems reviewed and are  negative.     Allergies  Review of patient's allergies indicates no known allergies.  Home Medications   Prior to Admission medications   Medication Sig Start Date End Date Taking? Authorizing Provider  acetaminophen (TYLENOL) 325 MG tablet Take 650 mg by mouth every 6 (six) hours as needed for mild pain.   Yes Historical Provider, MD  albuterol (PROVENTIL HFA;VENTOLIN HFA) 108 (90 BASE) MCG/ACT inhaler Inhale 2 puffs into the lungs every 6 (six) hours as needed for wheezing or shortness of breath. 07/20/14  Yes Boykin Nearing, MD  DM-Doxylamine-Acetaminophen (NYQUIL COLD & FLU PO) Take 30 mLs by mouth every 8 (eight) hours as needed (fever).   Yes Historical Provider, MD  ibuprofen (ADVIL,MOTRIN) 200 MG tablet Take 200 mg by mouth every 6 (six) hours as needed for moderate pain.   Yes Historical Provider, MD  amLODipine (NORVASC) 10 MG tablet Take 1 tablet (10 mg total) by mouth daily. Patient not taking: Reported on 07/02/2015 09/25/14   Boykin Nearing, MD  omeprazole (PRILOSEC) 20 MG capsule Take 1 capsule (20 mg total) by mouth daily. Patient not taking: Reported on 07/02/2015 07/20/14   Josalyn Funches, MD   BP 152/99 mmHg  Pulse 96  Temp(Src) 98 F (36.7 C) (Oral)  Resp 18  SpO2 95% Physical Exam  Constitutional: He is oriented to person, place, and time. He appears well-developed and well-nourished. No distress.  HENT:  Head: Normocephalic and atraumatic.  Mouth/Throat: Oropharynx is clear and moist. No oropharyngeal exudate.  Eyes: EOM are normal. Pupils are equal, round, and reactive to light.  Neck: Normal range of motion. Neck supple. No JVD present.  Cardiovascular: Normal rate and regular rhythm.  Exam reveals no  gallop and no friction rub.   No murmur heard. Pulmonary/Chest: Effort normal and breath sounds normal. No respiratory distress. He has no wheezes. He has no rales. He exhibits no tenderness.  Mild hyperventilation  Abdominal: Soft. Bowel sounds are normal. He  exhibits no distension and no mass. There is no tenderness. There is no rebound and no guarding.  Musculoskeletal: Normal range of motion. He exhibits no edema or tenderness.  Distal pulses are equal. No lower extremity swelling or asymmetry. No calf tenderness.  Neurological: He is alert and oriented to person, place, and time.  5/5 motor in all extremities. Subjective decreased sensation to the right hand. Sensation was normal.  Skin: Skin is warm and dry. No rash noted. No erythema.  Psychiatric:  Anxious appearing  Nursing note and vitals reviewed.   ED Course  Procedures (including critical care time) Labs Review Labs Reviewed  BRAIN NATRIURETIC PEPTIDE - Abnormal; Notable for the following:    B Natriuretic Peptide 545.5 (*)    All other components within normal limits  BASIC METABOLIC PANEL  CBC  D-DIMER, QUANTITATIVE (NOT AT [facility])  I-STAT TROPOININ, ED  Randolm Idol, ED    Imaging Review Dg Chest 2 View  07/02/2015  CLINICAL DATA:  Extreme shortness of breath, central chest pain and dizziness for 4 days, history hypertension, former smoker, recent dental infection EXAM: CHEST  2 VIEW COMPARISON:  07/13/2014 FINDINGS: Enlargement of cardiac silhouette. Mediastinal contours and pulmonary vascularity normal. Chronic bronchitic changes. No pulmonary infiltrate, pleural effusion or pneumothorax. No acute osseous findings. IMPRESSION: Enlargement of cardiac silhouette. Chronic bronchitic changes without acute abnormality. Electronically Signed   By: Lavonia Dana M.D.   On: 07/02/2015 12:20   I have personally reviewed and evaluated these images and lab results as part of my medical decision-making.   EKG Interpretation   Date/Time:  Friday July 02 2015 12:02:53 EDT Ventricular Rate:  107 PR Interval:  140 QRS Duration: 90 QT Interval:  344 QTC Calculation: 459 R Axis:   63 Text Interpretation:  Sinus tachycardia Biatrial enlargement Left  ventricular hypertrophy  Nonspecific T wave abnormality Abnormal ECG  Confirmed by Lita Mains  MD, Ysabel Cowgill ([id number]) on 07/02/2015 3:52:14 PM      MDM   Final diagnoses:  Chest pain, unspecified chest pain type  SOB (shortness of breath)   Patient states he is feeling much better. Initial troponin is normal. Questionable T wave inversions in lateral leads. D-dimer is normal. Will admit to internal medicine for chest pain and shortness of breath workup.    Julianne Rice, MD 07/02/15 [phone]

## 2015-07-02 NOTE — ED Notes (Signed)
Pt at xray

## 2015-07-02 NOTE — Progress Notes (Signed)
During assessment pt's right eye got found swollen, according to the patient it started immediately he got Lasix IV at ED,  informed to MD, pt is well set up at bed, vitals taken, tele continue, no any complaints at this point, will continue to monitor patient.

## 2015-07-03 ENCOUNTER — Inpatient Hospital Stay (HOSPITAL_COMMUNITY): Payer: Self-pay

## 2015-07-03 ENCOUNTER — Other Ambulatory Visit: Payer: Self-pay

## 2015-07-03 DIAGNOSIS — G459 Transient cerebral ischemic attack, unspecified: Secondary | ICD-10-CM

## 2015-07-03 DIAGNOSIS — R0789 Other chest pain: Secondary | ICD-10-CM

## 2015-07-03 DIAGNOSIS — Y92230 Patient room in hospital as the place of occurrence of the external cause: Secondary | ICD-10-CM

## 2015-07-03 DIAGNOSIS — J9600 Acute respiratory failure, unspecified whether with hypoxia or hypercapnia: Secondary | ICD-10-CM | POA: Diagnosis present

## 2015-07-03 DIAGNOSIS — T501X5A Adverse effect of loop [high-ceiling] diuretics, initial encounter: Secondary | ICD-10-CM

## 2015-07-03 LAB — HIV ANTIBODY (ROUTINE TESTING W REFLEX): HIV SCREEN 4TH GENERATION: NONREACTIVE

## 2015-07-03 LAB — TROPONIN I: TROPONIN I: 0.04 ng/mL — AB (ref <0.031)

## 2015-07-03 LAB — BASIC METABOLIC PANEL
ANION GAP: 12 (ref 5–15)
BUN: 17 mg/dL (ref 6–20)
CALCIUM: 9.1 mg/dL (ref 8.9–10.3)
CO2: 24 mmol/L (ref 22–32)
Chloride: 105 mmol/L (ref 101–111)
Creatinine, Ser: 1.25 mg/dL — ABNORMAL HIGH (ref 0.61–1.24)
Glucose, Bld: 113 mg/dL — ABNORMAL HIGH (ref 65–99)
Potassium: 3.8 mmol/L (ref 3.5–5.1)
SODIUM: 141 mmol/L (ref 135–145)

## 2015-07-03 LAB — HEMOGLOBIN A1C
Hgb A1c MFr Bld: 6 % — ABNORMAL HIGH (ref 4.8–5.6)
Mean Plasma Glucose: 126 mg/dL

## 2015-07-03 MED ORDER — DIPHENHYDRAMINE HCL 50 MG/ML IJ SOLN: 25.0000 mg | Freq: Once | INTRAMUSCULAR | Status: DC | PRN

## 2015-07-03 MED ORDER — LISINOPRIL 10 MG PO TABS
10.0000 mg | ORAL_TABLET | Freq: Every day | ORAL | Status: DC
  Administered 2015-07-03 – 2015-07-04 (×2): 10 mg via ORAL
  Filled 2015-07-03 (×2): qty 1

## 2015-07-03 MED ORDER — ACETAMINOPHEN 325 MG PO TABS
650.0000 mg | ORAL_TABLET | Freq: Four times a day (QID) | ORAL | Status: DC | PRN
  Administered 2015-07-03 – 2015-07-07 (×3): 650 mg via ORAL
  Filled 2015-07-03 (×4): qty 2

## 2015-07-03 MED ORDER — AMLODIPINE BESYLATE 10 MG PO TABS
10.0000 mg | ORAL_TABLET | Freq: Every day | ORAL | Status: DC
  Administered 2015-07-03 – 2015-07-04 (×3): 10 mg via ORAL
  Filled 2015-07-03 (×3): qty 1

## 2015-07-03 MED ORDER — TORSEMIDE 20 MG PO TABS
10.0000 mg | ORAL_TABLET | Freq: Every day | ORAL | Status: DC
  Administered 2015-07-03 – 2015-07-05 (×3): 10 mg via ORAL
  Filled 2015-07-03 (×3): qty 1

## 2015-07-03 MED ORDER — ATORVASTATIN CALCIUM 40 MG PO TABS
40.0000 mg | ORAL_TABLET | Freq: Every day | ORAL | Status: DC
  Administered 2015-07-03 (×2): 40 mg via ORAL
  Filled 2015-07-03 (×2): qty 1

## 2015-07-03 NOTE — Progress Notes (Signed)
S: Called because patient was complaining of bitemporal headache and prominent temporal veins. Patient indicated that the headache had been there since this morning and said it was relatively mild. He had no other complaints. He denied vision changes, changes in sensation, or weakness. He reports that this had never happened before.  O: Filed Vitals:   07/03/15 0200 07/03/15 0404 07/03/15 0503 07/03/15 1315  BP: 141/108 147/113  116/85  Pulse: 94 91  96  Temp: 98.6 F (37 C)   98.2 F (36.8 C)  TempSrc: Oral   Oral  Resp: 22   18  Height:      Weight:   163 lb 14.4 oz (74.345 kg)   SpO2: 97% 99%  98%   General: Lying in bed, NAD HEENT: Palpable, compressible temporal veins. PERRL. EOMI. No scleral icterus Cardiovascular: RRR, no m/r/g Pulmonary: CTAB, unlabored breathing Neurological: Speech normal. AAOx3. Tongue midline, Face symmetric. Visual [employer] in Education officer, community. 5/5 strength in all extremities. FTN normal. Light touch sensation in tact  A/P This likely represents a tension headache. Low suspicion for temporal arteritis at this time. Will initiate prn tylenol.

## 2015-07-03 NOTE — Progress Notes (Signed)
Patient c/o of SOB, 2L of oxygen was applied to the patient; O2 sats are 97% and patient's BP is currently 141/108; MD notified and orders given to administer BP medication and continue to monitor.

## 2015-07-03 NOTE — Progress Notes (Signed)
VASCULAR LAB PRELIMINARY  PRELIMINARY  PRELIMINARY  PRELIMINARY  Carotid duplex completed.     Bilateral:  No evidence of  ICA stenosis.  Vertebral artery flow is antegrade.     Janifer Adie, RVT, RDMS 07/03/2015, 4:15 PM

## 2015-07-03 NOTE — Progress Notes (Signed)
  Date: 07/03/2015  Patient name: Jerome Barnett  Medical record number: [id number]  Date of birth: [birth year]   I have seen and evaluated Ky Barban and discussed their care with the Residency Team. Mr Woitas is a very pleasant 45 yo man with PMHx sig for HTN and GERD. Record review shows that we was seen in ED 4/16 and in [facility] 5/16 for CP, dyspnea, and dizziness and W/U negative. This episode started with one week of intermittent dyspnea, dizziness, and chest pressure. When getting into car to come to ED, his R arm was numb and tingling but the motor fxn was intact.   Overnight, he had onset of dyspnea and O2 by Boykin was applied. He denies further CP, numbness, tingling. He did not rest well over night.  PMHx, Fam Hx, and/or Soc Hx : No resp or cardiac issues other than HTN in family.   Filed Vitals:   07/03/15 0200 07/03/15 0404  BP: 141/108 147/113  Pulse: 94 91  Temp: 98.6 F (37 C)   Resp: 22    Gen : NAD, sitting up in bed, breathing comfortably HRRR no MRG L few crackles in bases Ext no edema HEENT : R eye remains swollen  BNP > 500 Cr 1.25 Trop I neg x 4 LDL 172  I indep reviewed the CXR images and confirmed my reading with the official CXR reading. No sig volume overload  I indep reviewed the EKG and confirmed my reading with the official EKG reading. Sinus, BN atrial enlargement. LVH. NSTW changes  Assessment and Plan: I have seen and evaluated the patient as outlined above. I agree with the formulated Assessment and Plan as detailed in the residents' note, with the following changes:   1. Acute resp failure, mild - pt never hypoxic but RR max 23. The working dx is acute on chronic heart failure although we are obtaining ECHO to ascertain his cardiac fxn. His long standing HTN, possible OSA, EKG changes, BNP, and pul crackles support this. Would seem to be more L sided than R sided pointing towards HTN ass etiology. Diuresis is problematic as he has  observable R eye swelling to lasix. Try torsemide.   2. Chest pressure - R/O for AMI. Needs RF mgmt - statin, BP control, ASA.  3. TIA - MRI negative for CVA. Agree with ASA and statin.  Bartholomew Crews, MD 4/15/201710:40 AM

## 2015-07-03 NOTE — Progress Notes (Signed)
Subjective: NAEON.  He had one episode of SOB when lying flat, improved with 2L [place] and sitting up.  He denies SOB this morning.  His right eye remains slightly swollen.  He says the reaction has only occurred with Amoxicillin before.  The first time it happened, it took 3 days to resolve.  The second time, it resolved in half a day.  Objective: Vital signs in last 24 hours: Filed Vitals:   07/02/15 2001 07/03/15 0200 07/03/15 0404 07/03/15 0503  BP: 143/106 141/108 147/113   Pulse: 104 94 91   Temp: 97.5 F (36.4 C) 98.6 F (37 C)    TempSrc: Oral Oral    Resp: 18 22    Height:      Weight:    163 lb 14.4 oz (74.345 kg)  SpO2: 94% 97% 99%    Weight change:   Intake/Output Summary (Last 24 hours) at 07/03/15 0737 Last data filed at 07/03/15 0252  Gross per 24 hour  Intake      3 ml  Output      0 ml  Net      3 ml   Physical Exam  Constitutional: He is oriented to person, place, and time and well-developed, well-nourished, and in no distress. No distress.  HENT:  Head: Normocephalic and atraumatic.  Eyes: EOM are normal. Pupils are equal, round, and reactive to light.  Right eyelid slightly swollen.  Sclera not injected.  Neck: No tracheal deviation present.  Cardiovascular: Normal rate, regular rhythm and normal heart sounds.   Pulmonary/Chest: Effort normal and breath sounds normal. No stridor. No respiratory distress. He has no wheezes.  Minimal crackles in bilateral bases.  Abdominal: Soft. He exhibits no distension. There is no tenderness. There is no rebound and no guarding.  Musculoskeletal: He exhibits no edema.  Neurological: He is alert and oriented to person, place, and time.  Skin: Skin is warm and dry. He is not diaphoretic.    Lab Results: Basic Metabolic Panel:  Recent Labs Lab 07/02/15 1203 07/03/15 0420  NA 141 141  K 3.9 3.8  CL 107 105  CO2 23 24  GLUCOSE 98 113*  BUN 14 17  CREATININE 1.18 1.25*  CALCIUM 9.4 9.1   Liver Function  Tests:  Recent Labs Lab 07/02/15 1707  AST 25  ALT 38  ALKPHOS 50  BILITOT 1.4*  PROT 6.7  ALBUMIN 4.0   No results for input(s): LIPASE, AMYLASE in the last 168 hours. No results for input(s): AMMONIA in the last 168 hours. CBC:  Recent Labs Lab 07/02/15 1203  WBC 5.0  HGB 13.2  HCT 40.0  MCV 83.7  PLT 226   Cardiac Enzymes:  Recent Labs Lab 07/02/15 1707 07/02/15 2253 07/03/15 0420  TROPONINI 0.04* 0.05* 0.04*   BNP: No results for input(s): PROBNP in the last 168 hours. D-Dimer:  Recent Labs Lab 07/02/15 1343  DDIMER 0.34   CBG: No results for input(s): GLUCAP in the last 168 hours. Hemoglobin A1C:  Recent Labs Lab 07/02/15 1700  HGBA1C 6.0*   Fasting Lipid Panel:  Recent Labs Lab 07/02/15 1700  CHOL 239*  HDL 43  LDLCALC 172*  TRIG 122  CHOLHDL 5.6   Thyroid Function Tests:  Recent Labs Lab 07/02/15 1700  TSH 0.910   Coagulation: No results for input(s): LABPROT, INR in the last 168 hours. Anemia Panel: No results for input(s): VITAMINB12, FOLATE, FERRITIN, TIBC, IRON, RETICCTPCT in the last 168 hours. Urine Drug Screen: Drugs  of Abuse  No results found for: LABOPIA, COCAINSCRNUR, LABBENZ, AMPHETMU, THCU, LABBARB  Alcohol Level: No results for input(s): ETH in the last 168 hours. Urinalysis: No results for input(s): COLORURINE, LABSPEC, PHURINE, GLUCOSEU, HGBUR, BILIRUBINUR, KETONESUR, PROTEINUR, UROBILINOGEN, NITRITE, LEUKOCYTESUR in the last 168 hours.  Invalid input(s): APPERANCEUR Misc. Labs:   Micro Results: No results found for this or any previous visit (from the past 240 hour(s)). Studies/Results: Dg Chest 2 View  07/02/2015  CLINICAL DATA:  Extreme shortness of breath, central chest pain and dizziness for 4 days, history hypertension, former smoker, recent dental infection EXAM: CHEST  2 VIEW COMPARISON:  07/13/2014 FINDINGS: Enlargement of cardiac silhouette. Mediastinal contours and pulmonary vascularity normal.  Chronic bronchitic changes. No pulmonary infiltrate, pleural effusion or pneumothorax. No acute osseous findings. IMPRESSION: Enlargement of cardiac silhouette. Chronic bronchitic changes without acute abnormality. Electronically Signed   By: Lavonia Dana M.D.   On: 07/02/2015 12:20   Ct Head Wo Contrast  07/02/2015  CLINICAL DATA:  Right upper extremity weakness for 1 day EXAM: CT HEAD WITHOUT CONTRAST TECHNIQUE: Contiguous axial images were obtained from the base of the skull through the vertex without intravenous contrast. COMPARISON:  None. FINDINGS: The ventricles are normal in size and configuration. There is no intracranial mass, hemorrhage, extra-axial fluid collection, or midline shift. The gray-white compartments are normal. There is no acute infarct evident. Bony calvarium appears intact. Mastoid air cells are clear. No intraorbital lesions are evident. There is mucosal thickening in the left maxillary antrum. There is also mild mucosal thickening of several ethmoid air cells. There is leftward deviation of the nasal septum. There is a prominent concha bullosa on the right, an anatomic variant. IMPRESSION: Paranasal sinus disease, most notable in the left maxillary antrum. No intracranial mass, hemorrhage, or focal gray-white compartment lesions/acute appearing infarct. Electronically Signed   By: Lowella Grip III M.D.   On: 07/02/2015 17:50   Mr Brain Wo Contrast  07/02/2015  CLINICAL DATA:  One week history of dizziness. Presyncope. Episodic RIGHT arm weakness. EXAM: MRI HEAD WITHOUT CONTRAST TECHNIQUE: Multiplanar, multiecho pulse sequences of the brain and surrounding structures were obtained without intravenous contrast. COMPARISON:  CT head earlier today. FINDINGS: No evidence for acute infarction, hemorrhage, mass lesion, hydrocephalus, or extra-axial fluid. Normal cerebral volume. Minor white matter disease, no significant white matter disease. Flow voids are maintained throughout the  carotid, basilar, and vertebral arteries. There are no areas of chronic hemorrhage. Pituitary, pineal, and cerebellar tonsils unremarkable. No upper cervical lesions. Acute and chronic LEFT maxillary sinus disease. Nasal septal deviation RIGHT to LEFT with large RIGHT concha bullosa. Negative orbits. No mastoid or middle ear fluid. Visualized extracranial soft tissues unremarkable. IMPRESSION: No acute or focal intracranial abnormalities. No evidence for large vessel occlusion. Acute and chronic LEFT maxillary sinusitis. Electronically Signed   By: Staci Righter M.D.   On: 07/02/2015 21:19   Medications: I have reviewed the patient's current medications. Scheduled Meds: . amLODipine  10 mg Oral Daily  . aspirin EC  81 mg Oral Daily  . atorvastatin  40 mg Oral q1800  . enoxaparin (LOVENOX) injection  40 mg Subcutaneous Q24H  . lisinopril  10 mg Oral Daily  . pantoprazole  40 mg Oral Daily  . sodium chloride flush  3 mL Intravenous Q12H   Continuous Infusions:  PRN Meds:.albuterol, nitroGLYCERIN Assessment/Plan: Principal Problem:   TIA (transient ischemic attack) Active Problems:   HTN (hypertension)   GERD (gastroesophageal reflux disease)   Chest pain  Jerome Barnett is a 45 yo male with HTN and GERD, presenting with one week h/o intermittent SOB, chest pressure, and dizziness.  SOB: Patient presents with orthopnea, inspiratory crackles, elevated BNP, LVH, cardiomegaly, and h/o uncontrolled HTN. His SOB is most likely 2/2 CHF, likely HFpEF given his LVH and HTN. This is likely worsened by some degree of underlying OSA. Troponins stable overnight.  He remained hypertensive on Amlodipine, so will add Lisinopril as well.  Unfortunately, patient had mild allergic reaction to Furosemide yesterday with right eye swelling and injection without respiratory symptoms.  He reports a similar reaction to Amoxicillin taken a week ago.  There should not be cross-reactivity between these drugs.  Furosemide  does have a sulfa moiety, but Amoxicillin only has sulfur and there should not be a cross reaction.  Will start Lisinopril this morning and see if patient reacts.  Will then introduce Torsemide this afternoon.  If he is unable to tolerate any loop diuretics, we can try Ethacrynic acid for diuresis, but lack of insurance may make acquiring drug difficult.  Will watch patient's response to Torsemide and consult SW for medication assistance if necessary. [ ]  Echo - [place] - ASA - Nitro - Torsemide 10 mg PO  - Albuterol q6h PRN - Amlodipine 10 mg daily - Lisinopril 10 mg daily - Daily weights. - Strict I/Os  TIA: Patients description of transient arm weakness and numbness/tingling, as well as uncontrolled HTN certainly concerning for TIAs. No focal findings of neuro exam in the ED, so do not think he is having acute stroke. No LOC or bowel/bladder incontinence. No h/o afib, though he has had palpitations in the past. Lipids elevated.  A1c in prediabetes range. [name] - ASA - Amlodipine 10 - Lisinopril 10 - Atorva 40 mg [ ]  Carotid doppler  Allergy: Patient had mild allergic reaction to Furosemide yesterday with right eye swelling and injection without respiratory symptoms.  He reports a similar reaction to Amoxicillin taken a week ago.  There should not be cross-reactivity between these drugs.  Furosemide does have a sulfa moiety, but Amoxicillin only has sulfur and there should not be a cross reaction.  Will monitor patient for development of respiratory complaint while we attempt to find an effective diuretic.  OSA: Wife reports symptoms of OSA. Patient will need outpatient sleep study.  GERD: Protonix  FEN/GI: - HH  DVT Ppx: Lovenox   Dispo: Disposition is deferred at this time, awaiting improvement of current medical problems.  Anticipated discharge in approximately 1 day(s).   The patient does have a current PCP (Boykin Nearing, MD) and does need an [facility] hospital follow-up  appointment after discharge.  The patient does not have transportation limitations that hinder transportation to clinic appointments.  .Services Needed at time of discharge: Y = Yes, Blank = No PT:   OT:   RN:   Equipment:   Other:       Iline Oven, MD 07/03/2015, 7:37 AM

## 2015-07-04 ENCOUNTER — Inpatient Hospital Stay (HOSPITAL_COMMUNITY): Payer: Self-pay

## 2015-07-04 DIAGNOSIS — R079 Chest pain, unspecified: Secondary | ICD-10-CM

## 2015-07-04 DIAGNOSIS — E785 Hyperlipidemia, unspecified: Secondary | ICD-10-CM | POA: Insufficient documentation

## 2015-07-04 DIAGNOSIS — I5023 Acute on chronic systolic (congestive) heart failure: Secondary | ICD-10-CM

## 2015-07-04 LAB — ECHOCARDIOGRAM COMPLETE
Height: 68 in
Weight: 2568 oz

## 2015-07-04 MED ORDER — SODIUM CHLORIDE 0.9% FLUSH
3.0000 mL | Freq: Two times a day (BID) | INTRAVENOUS | Status: DC
  Administered 2015-07-05: 3 mL via INTRAVENOUS

## 2015-07-04 MED ORDER — PERFLUTREN LIPID MICROSPHERE
1.0000 mL | INTRAVENOUS | Status: AC | PRN
Start: 2015-07-04 — End: 2015-07-04
  Filled 2015-07-04: qty 10

## 2015-07-04 MED ORDER — DIGOXIN 250 MCG PO TABS
0.2500 mg | ORAL_TABLET | Freq: Every day | ORAL | Status: DC
  Administered 2015-07-04 – 2015-07-08 (×5): 0.25 mg via ORAL
  Filled 2015-07-04 (×3): qty 1
  Filled 2015-07-04: qty 2
  Filled 2015-07-04: qty 1

## 2015-07-04 MED ORDER — SODIUM CHLORIDE 0.9% FLUSH: 3.0000 mL | INTRAVENOUS | Status: DC | PRN

## 2015-07-04 MED ORDER — ASPIRIN 81 MG PO CHEW
81.0000 mg | CHEWABLE_TABLET | ORAL | Status: AC
  Administered 2015-07-05: 81 mg via ORAL
  Filled 2015-07-04: qty 1

## 2015-07-04 MED ORDER — SODIUM CHLORIDE 0.9 % IV SOLN
INTRAVENOUS | Status: DC
  Administered 2015-07-05: 06:00:00 via INTRAVENOUS
  Administered 2015-07-05: 500 mL via INTRAVENOUS

## 2015-07-04 MED ORDER — PERFLUTREN LIPID MICROSPHERE
INTRAVENOUS | Status: AC
  Administered 2015-07-04: 2 mL
  Filled 2015-07-04: qty 10

## 2015-07-04 MED ORDER — SODIUM CHLORIDE 0.9 % IV SOLN: 250.0000 mL | INTRAVENOUS | Status: DC | PRN

## 2015-07-04 MED ORDER — LOSARTAN POTASSIUM 25 MG PO TABS
25.0000 mg | ORAL_TABLET | Freq: Every day | ORAL | Status: DC
  Administered 2015-07-04 – 2015-07-07 (×4): 25 mg via ORAL
  Filled 2015-07-04 (×3): qty 1

## 2015-07-04 MED ORDER — SPIRONOLACTONE 25 MG PO TABS
12.5000 mg | ORAL_TABLET | Freq: Every day | ORAL | Status: DC
  Administered 2015-07-04 – 2015-07-08 (×5): 12.5 mg via ORAL
  Filled 2015-07-04 (×5): qty 1

## 2015-07-04 MED ORDER — ATORVASTATIN CALCIUM 80 MG PO TABS
80.0000 mg | ORAL_TABLET | Freq: Every day | ORAL | Status: DC
  Administered 2015-07-05 – 2015-07-07 (×3): 80 mg via ORAL
  Filled 2015-07-04 (×4): qty 1

## 2015-07-04 NOTE — Progress Notes (Signed)
Subjective: Yesterday afternoon he complained of a bitemporal headache. He reports his headache went away with the Tylenol and has not reoccurred. He states he slept well last night and was able to lie down flat. He denies any SOB or chest pain.  Objective: Vital signs in last 24 hours: Filed Vitals:   07/03/15 1315 07/03/15 2045 07/03/15 2335 07/04/15 0510  BP: 116/85 113/70 114/88 124/96  Pulse: 96 95 89 96  Temp: 98.2 F (36.8 C) 98.7 F (37.1 C) 98.4 F (36.9 C) 98.2 F (36.8 C)  TempSrc: Oral Oral Oral Oral  Resp: 18 18 16 18   Height:      Weight:    160 lb 8 oz (72.802 kg)  SpO2: 98% 95% 96% 98%   Weight change: -4 lb 4.8 oz (-1.95 kg)  Intake/Output Summary (Last 24 hours) at 07/04/15 0720 Last data filed at 07/04/15 0600  Gross per 24 hour  Intake    340 ml  Output   2500 ml  Net  -2160 ml   Physical Exam  Constitutional: He is oriented to person, place, and time and well-developed, well-nourished, and in no distress. No distress.  HENT:  Head: Normocephalic and atraumatic.  Eyes: EOM are normal. Pupils are equal, round, and reactive to light.  Right eyelid no longer swollen.  Neck: No tracheal deviation present.  Cardiovascular: Normal rate, regular rhythm and normal heart sounds.   Pulmonary/Chest: Effort normal and breath sounds normal. No stridor. No respiratory distress. He has no wheezes.  Minimal crackles in bilateral bases.  Abdominal: Soft. He exhibits no distension. There is no tenderness. There is no rebound and no guarding.  Musculoskeletal: He exhibits no edema.  Neurological: He is alert and oriented to person, place, and time.  Skin: Skin is warm and dry. He is not diaphoretic.    Lab Results: Basic Metabolic Panel:  Recent Labs Lab 07/02/15 1203 07/03/15 0420  NA 141 141  K 3.9 3.8  CL 107 105  CO2 23 24  GLUCOSE 98 113*  BUN 14 17  CREATININE 1.18 1.25*  CALCIUM 9.4 9.1   Liver Function Tests:  Recent Labs Lab 07/02/15 1707   AST 25  ALT 38  ALKPHOS 50  BILITOT 1.4*  PROT 6.7  ALBUMIN 4.0   CBC:  Recent Labs Lab 07/02/15 1203  WBC 5.0  HGB 13.2  HCT 40.0  MCV 83.7  PLT 226   Cardiac Enzymes:  Recent Labs Lab 07/02/15 1707 07/02/15 2253 07/03/15 0420  TROPONINI 0.04* 0.05* 0.04*   D-Dimer:  Recent Labs Lab 07/02/15 1343  DDIMER 0.34   Hemoglobin A1C:  Recent Labs Lab 07/02/15 1700  HGBA1C 6.0*   Fasting Lipid Panel:  Recent Labs Lab 07/02/15 1700  CHOL 239*  HDL 43  LDLCALC 172*  TRIG 122  CHOLHDL 5.6   Thyroid Function Tests:  Recent Labs Lab 07/02/15 1700  TSH 0.910   Studies/Results: Dg Chest 2 View  07/02/2015  CLINICAL DATA:  Extreme shortness of breath, central chest pain and dizziness for 4 days, history hypertension, former smoker, recent dental infection EXAM: CHEST  2 VIEW COMPARISON:  07/13/2014 FINDINGS: Enlargement of cardiac silhouette. Mediastinal contours and pulmonary vascularity normal. Chronic bronchitic changes. No pulmonary infiltrate, pleural effusion or pneumothorax. No acute osseous findings. IMPRESSION: Enlargement of cardiac silhouette. Chronic bronchitic changes without acute abnormality. Electronically Signed   By: Lavonia Dana M.D.   On: 07/02/2015 12:20   Ct Head Wo Contrast  07/02/2015  CLINICAL DATA:  Right upper extremity weakness for 1 day EXAM: CT HEAD WITHOUT CONTRAST TECHNIQUE: Contiguous axial images were obtained from the base of the skull through the vertex without intravenous contrast. COMPARISON:  None. FINDINGS: The ventricles are normal in size and configuration. There is no intracranial mass, hemorrhage, extra-axial fluid collection, or midline shift. The gray-white compartments are normal. There is no acute infarct evident. Bony calvarium appears intact. Mastoid air cells are clear. No intraorbital lesions are evident. There is mucosal thickening in the left maxillary antrum. There is also mild mucosal thickening of several  ethmoid air cells. There is leftward deviation of the nasal septum. There is a prominent concha bullosa on the right, an anatomic variant. IMPRESSION: Paranasal sinus disease, most notable in the left maxillary antrum. No intracranial mass, hemorrhage, or focal gray-white compartment lesions/acute appearing infarct. Electronically Signed   By: Lowella Grip III M.D.   On: 07/02/2015 17:50   Mr Brain Wo Contrast  07/02/2015  CLINICAL DATA:  One week history of dizziness. Presyncope. Episodic RIGHT arm weakness. EXAM: MRI HEAD WITHOUT CONTRAST TECHNIQUE: Multiplanar, multiecho pulse sequences of the brain and surrounding structures were obtained without intravenous contrast. COMPARISON:  CT head earlier today. FINDINGS: No evidence for acute infarction, hemorrhage, mass lesion, hydrocephalus, or extra-axial fluid. Normal cerebral volume. Minor white matter disease, no significant white matter disease. Flow voids are maintained throughout the carotid, basilar, and vertebral arteries. There are no areas of chronic hemorrhage. Pituitary, pineal, and cerebellar tonsils unremarkable. No upper cervical lesions. Acute and chronic LEFT maxillary sinus disease. Nasal septal deviation RIGHT to LEFT with large RIGHT concha bullosa. Negative orbits. No mastoid or middle ear fluid. Visualized extracranial soft tissues unremarkable. IMPRESSION: No acute or focal intracranial abnormalities. No evidence for large vessel occlusion. Acute and chronic LEFT maxillary sinusitis. Electronically Signed   By: Staci Righter M.D.   On: 07/02/2015 21:19   Medications: I have reviewed the patient's current medications. Scheduled Meds: . amLODipine  10 mg Oral Daily  . aspirin EC  81 mg Oral Daily  . atorvastatin  40 mg Oral q1800  . enoxaparin (LOVENOX) injection  40 mg Subcutaneous Q24H  . lisinopril  10 mg Oral Daily  . pantoprazole  40 mg Oral Daily  . sodium chloride flush  3 mL Intravenous Q12H  . torsemide  10 mg Oral  Daily   Continuous Infusions:  PRN Meds:.acetaminophen, albuterol, diphenhydrAMINE, nitroGLYCERIN Assessment/Plan:  Jerome Barnett is a 45 yo man with HTN and GERD, presenting with one week h/o intermittent SOB, chest pressure, and dizziness.  SOB: Patient presented with orthopnea, inspiratory crackles, elevated BNP, LVH, cardiomegaly, and h/o uncontrolled HTN. His SOB is most likely 2/2 CHF, likely HFpEF given his LVH and HTN. This is likely worsened by some degree of underlying OSA.He had a mild allergic reaction to lasix (see below), but tolerated torsemide well. His UOP was excellent with 2.5 L out. Weight is down 4 lbs since admission. Awaiting an echocardiogram to evaluate his LV function. We will discharge him on torsemide 10 mg daily as he is close to being euvolemic today. He will follow up in our Internal medicine clinic.  - Discharge today once echo done - f/u Echo - [place] - ASA - Nitro - Torsemide 10 mg PO  - Albuterol q6h PRN - Amlodipine 10 mg daily - Lisinopril 10 mg daily - Daily weights. - Strict I/Os  TIA: Patients description of transient arm weakness and numbness/tingling, as well as uncontrolled HTN certainly concerning for  TIAs. No focal findings of neuro exam in the ED. His CT and MRI brain were negative for acute stroke. No LOC or bowel/bladder incontinence. No h/o afib, though he has had palpitations in the past.Lipids elevated.  A1c in prediabetes range. His carotid dopplers were negative for any IC stenosis.  [name] - ASA - Amlodipine 10 mg daily - Lisinopril 10 mg daily - Atorvastatin 40 mg daily   Allergy: Patient had mild allergic reaction to Furosemide with right eye swelling and injection without respiratory symptoms.  He reports a similar reaction to Amoxicillin taken a week ago.  There should not be cross-reactivity between these drugs.  Furosemide does have a sulfa moiety, but Amoxicillin only has sulfur and there should not be a cross reaction.  His swelling has subsided and he tolerated Torsemide well.   OSA: Wife reports symptoms of OSA. Patient will need outpatient sleep study.  GERD: Protonix  FEN/GI: HH DVT Ppx: Lovenox  Dispo: Discharge likely today after echo completed   The patient does have a current PCP (Boykin Nearing, MD) and does need an [facility] hospital follow-up appointment after discharge.  The patient does not have transportation limitations that hinder transportation to clinic appointments.  .Services Needed at time of discharge: Y = Yes, Blank = No PT:   OT:   RN:   Equipment:   Other:     LOS: 1 day   Juliet Rude, MD 07/04/2015, 7:20 AM

## 2015-07-04 NOTE — Progress Notes (Signed)
  Echocardiogram 2D Echocardiogram has been performed.  Jerome Barnett 07/04/2015, 12:22 PM

## 2015-07-04 NOTE — Consult Note (Signed)
CARDIOLOGY CONSULT NOTE   Patient ID: Jerome Barnett MRN: [id number] DOB/AGE: Aug 23, 1970 45 y.o.  Admit date: 07/02/2015  Primary Physician   Minerva Ends, MD Primary Cardiologist: New Reason for Consultation: CHF  HPI: Jerome Barnett is a 45 year old male with past medical history of hypertension, GERD, previous smoker (quit 2 years ago). He presented to the ED on 07/02/2015 with complaints of chest tightness, shortness of breath, and lightheadedness that is worse with exertion, especially over the last week. Of note, he quit taking his blood pressure medicine several months ago.   He works as a Paediatric nurse frequently, so he has a great deal of walking to do during his daily work activities.  He noticed that he has become SOB with walking about one week ago.  He also likes to mountain bike frequently and last week he could not bike more than a half mile without becoming SOB. He reports that he eats fast food for almost every meal or [place] food.   His troponin was mildly elevated at 0.04. EKG shows sinus tachycardia with evidence of LVH.  Echo done on 07/04/15 shows mild LVH, systolic function was severely reduced with EF of 10-15%, diffuse hypokinesis with no idefinable regional variations. (Report below). He is prediabetic with an A1c of 6. His LDL is 172.  He has no family history of MI or heart failure. Denies recent illness. Endorses orthopnea. Denies lower extremity edema.    Allergies  Allergen Reactions  . Amoxicillin Swelling    Eye swelling  . Lasix [Furosemide] Swelling    Eye swelling    I have reviewed the patient's current medications . amLODipine  10 mg Oral Daily  . aspirin EC  81 mg Oral Daily  . atorvastatin  40 mg Oral q1800  . enoxaparin (LOVENOX) injection  40 mg Subcutaneous Q24H  . lisinopril  10 mg Oral Daily  . pantoprazole  40 mg Oral Daily  . sodium chloride flush  3 mL Intravenous Q12H  . torsemide  10 mg Oral  Daily     acetaminophen, albuterol, diphenhydrAMINE, nitroGLYCERIN  Prior to Admission medications   Medication Sig Start Date End Date Taking? Authorizing Provider  acetaminophen (TYLENOL) 325 MG tablet Take 650 mg by mouth every 6 (six) hours as needed for mild pain.   Yes Historical Provider, MD  albuterol (PROVENTIL HFA;VENTOLIN HFA) 108 (90 BASE) MCG/ACT inhaler Inhale 2 puffs into the lungs every 6 (six) hours as needed for wheezing or shortness of breath. 07/20/14  Yes Jerome Nearing, MD  DM-Doxylamine-Acetaminophen (NYQUIL COLD & FLU PO) Take 30 mLs by mouth every 8 (eight) hours as needed (fever).   Yes Historical Provider, MD  ibuprofen (ADVIL,MOTRIN) 200 MG tablet Take 200 mg by mouth every 6 (six) hours as needed for moderate pain.   Yes Historical Provider, MD  amLODipine (NORVASC) 10 MG tablet Take 1 tablet (10 mg total) by mouth daily. Patient not taking: Reported on 07/02/2015 09/25/14   Jerome Nearing, MD  omeprazole (PRILOSEC) 20 MG capsule Take 1 capsule (20 mg total) by mouth daily. Patient not taking: Reported on 07/02/2015 07/20/14   Jerome Nearing, MD     Social History   Social History  . Marital Status: Married    Spouse Name: N/A  . Number of Children: N/A  . Years of Education: N/A   Occupational History  . Not on file.   Social History Main Topics  . Smoking status: Former Smoker --  2.00 packs/day for 20 years    Quit date: 03/20/2012  . Smokeless tobacco: Never Used  . Alcohol Use: No  . Drug Use: No  . Sexual Activity: Not on file   Other Topics Concern  . Not on file   Social History Narrative   Patient is married, no children.   Has pets   Works full time at [employer]     Family Status  Relation Status Death Age  . Mother Alive   . Father Alive    Family History  Problem Relation Age of Onset  . Hypertension Father   . Hyperlipidemia Father      ROS:  Full 14 point review of systems complete and found to be negative unless listed  above.  Physical Exam: Blood pressure 107/82, pulse 95, temperature 98.2 F (36.8 C), temperature source Oral, resp. rate 18, height 5\' 8"  (1.727 m), weight 160 lb 8 oz (72.802 kg), SpO2 98 %.  General: Well developed, well nourished, male in no acute distress Head: Eyes PERRLA, No xanthomas.   Normocephalic and atraumatic, oropharynx without edema or exudate.  Lungs:Fine crackles in bilateral lower lobes.  Heart: HRRR S1 S2, no rub/gallop, no murmur. pulses are 2+ extrem.   Neck: No carotid bruits. No lymphadenopathy. No JVD. Abdomen: Bowel sounds present, abdomen soft and non-tender without masses or hernias noted. Msk:  No spine or cva tenderness. No weakness, no joint deformities or effusions. Extremities: No clubbing or cyanosis. no edema.  Neuro: Alert and oriented X 3. No focal deficits noted. Psych:  Good affect, responds appropriately Skin: No rashes or lesions noted.  Labs:   Lab Results  Component Value Date   WBC 5.0 07/02/2015   HGB 13.2 07/02/2015   HCT 40.0 07/02/2015   MCV 83.7 07/02/2015   PLT 226 07/02/2015    Recent Labs Lab 07/02/15 1707 07/03/15 0420  NA  --  141  K  --  3.8  CL  --  105  CO2  --  24  BUN  --  17  CREATININE  --  1.25*  CALCIUM  --  9.1  PROT 6.7  --   BILITOT 1.4*  --   ALKPHOS 50  --   ALT 38  --   AST 25  --   GLUCOSE  --  113*  ALBUMIN 4.0  --    Recent Labs  07/02/15 1707 07/02/15 2253 07/03/15 0420  TROPONINI 0.04* 0.05* 0.04*    Recent Labs  07/02/15 1210 07/02/15 1557  TROPIPOC 0.04 0.02   B NATRIURETIC PEPTIDE  Date/Time Value Ref Range Status  07/02/2015 12:03 PM 545.5* 0.0 - 100.0 pg/mL Final   Lab Results  Component Value Date   CHOL 239* 07/02/2015   HDL 43 07/02/2015   LDLCALC 172* 07/02/2015   TRIG 122 07/02/2015   Lab Results  Component Value Date   DDIMER 0.34 07/02/2015   No results found for: LIPASE, AMYLASE TSH  Date/Time Value Ref Range Status  07/02/2015 05:00 PM 0.910 0.350 -  4.500 uIU/mL Final   Echo: 07/04/15 - Left ventricle: The cavity size was moderately dilated. Wall  thickness was increased in a pattern of mild LVH. Left  ventricular geometry showed evidence of eccentric hypertrophy.  Systolic function was severely reduced. The estimated ejection  fraction was in the range of 10% to 15%. Severe diffuse  hypokinesis with no identifiable regional variations. No evidence  of thrombus. - Aortic valve: There was mild regurgitation. -  Left atrium: The atrium was moderately dilated.  ECG:  STach   Radiology:  Ct Head Wo Contrast  07/02/2015  CLINICAL DATA:  Right upper extremity weakness for 1 day EXAM: CT HEAD WITHOUT CONTRAST TECHNIQUE: Contiguous axial images were obtained from the base of the skull through the vertex without intravenous contrast. COMPARISON:  None. FINDINGS: The ventricles are normal in size and configuration. There is no intracranial mass, hemorrhage, extra-axial fluid collection, or midline shift. The gray-white compartments are normal. There is no acute infarct evident. Bony calvarium appears intact. Mastoid air cells are clear. No intraorbital lesions are evident. There is mucosal thickening in the left maxillary antrum. There is also mild mucosal thickening of several ethmoid air cells. There is leftward deviation of the nasal septum. There is a prominent concha bullosa on the right, an anatomic variant. IMPRESSION: Paranasal sinus disease, most notable in the left maxillary antrum. No intracranial mass, hemorrhage, or focal gray-white compartment lesions/acute appearing infarct. Electronically Signed   By: Jerome Barnett M.D.   On: 07/02/2015 17:50   Mr Brain Wo Contrast  07/02/2015  CLINICAL DATA:  One week history of dizziness. Presyncope. Episodic RIGHT arm weakness. EXAM: MRI HEAD WITHOUT CONTRAST TECHNIQUE: Multiplanar, multiecho pulse sequences of the brain and surrounding structures were obtained without intravenous  contrast. COMPARISON:  CT head earlier today. FINDINGS: No evidence for acute infarction, hemorrhage, mass lesion, hydrocephalus, or extra-axial fluid. Normal cerebral volume. Minor white matter disease, no significant white matter disease. Flow voids are maintained throughout the carotid, basilar, and vertebral arteries. There are no areas of chronic hemorrhage. Pituitary, pineal, and cerebellar tonsils unremarkable. No upper cervical lesions. Acute and chronic LEFT maxillary sinus disease. Nasal septal deviation RIGHT to LEFT with large RIGHT concha bullosa. Negative orbits. No mastoid or middle ear fluid. Visualized extracranial soft tissues unremarkable. IMPRESSION: No acute or focal intracranial abnormalities. No evidence for large vessel occlusion. Acute and chronic LEFT maxillary sinusitis. Electronically Signed   By: Jerome Barnett M.D.   On: 07/02/2015 21:19    ASSESSMENT AND PLAN:    Principal Problem:   TIA (transient ischemic attack) Active Problems:   HTN (hypertension)   GERD (gastroesophageal reflux disease)   Chest pain   Acute respiratory failure (HCC)   Acute on chronic systolic congestive heart failure (HCC)   Hyperlipidemia  1. Acute systolic CHF: Echo report above. Patient has severely reduced EF. He will need cardiac catheterization to rule out ischemic cause. Will initiate daily weights and placed on fluid restriction.  He will need lifestyle modifications. Amlodipine discontinued. Placed on ARB, digoxin and spiro. Patient had suspected reaction to furosemide. Currently he is on oral torsemide. He is down 3 pounds from yesterday and 4 pounds from admission. We will follow output, given his intolerance to IV furosemide may have to switch to ethacrynic acid if he truly has sulfa allergy.  2. HLD: LDL is 172 is. He needs high intensity statin. Will increase atorvastatin to 80 mg.   3. TIA: MRI of bran done, no evidence of hemorrhage or intercranial abnormalities. He is at risk  for cardiac embolic event.   4. HTN: Controlled on current regimen.     Signed: Arbutus Leas, NP 07/04/2015 2:17 PM Pager [phone]  Co-Sign MD  Patient seen and examined with Jerome Booze NP-C. We discussed all aspects of the encounter. I agree with the assessment and plan as stated above.   Echo reviewed personally he has new onset HF in setting of severe  LV dysfunction Ef 15-20%. RV ok. He has risk factors for both CAD (HTN, HL) and NICM (uncontrolled HTN and recent viral illness). Will plan R/L heart cath tomorrow. Volume status improved. Will stop amlodipine. Add digoxin and spiro. Change lisinopril to losartan in anticipation of Entresto.   Doubt allergy to lasix. But if prove to be allergic to sulfa moiety can consider ethacrynic acid as needed. He is at risk for cardioembolism with low EF but no LV clot on echo with Definity.  TSH and HIV normal. Will check ferritin and hepatitis panels for completeness sake.   Jerome Warn,MD 3:11 PM

## 2015-07-05 ENCOUNTER — Encounter (HOSPITAL_COMMUNITY)
Admission: EM | Disposition: A | Discharge status: Home / Self Care | Payer: Self-pay | Source: Home / Self Care | Attending: Internal Medicine

## 2015-07-05 DIAGNOSIS — E785 Hyperlipidemia, unspecified: Secondary | ICD-10-CM

## 2015-07-05 DIAGNOSIS — Z888 Allergy status to other drugs, medicaments and biological substances status: Secondary | ICD-10-CM

## 2015-07-05 DIAGNOSIS — I5021 Acute systolic (congestive) heart failure: Secondary | ICD-10-CM | POA: Insufficient documentation

## 2015-07-05 HISTORY — PX: CARDIAC CATHETERIZATION: SHX172

## 2015-07-05 LAB — POCT I-STAT 3, ART BLOOD GAS (G3+)
ACID-BASE DEFICIT: 1 mmol/L (ref 0.0–2.0)
BICARBONATE: 25.7 meq/L — AB (ref 20.0–24.0)
Bicarbonate: 24.9 mEq/L — ABNORMAL HIGH (ref 20.0–24.0)
O2 SAT: 77 %
O2 SAT: 94 %
PO2 ART: 71 mmHg — AB (ref 80.0–100.0)
TCO2: 26 mmol/L (ref 0–100)
TCO2: 27 mmol/L (ref 0–100)
pCO2 arterial: 39.4 mmHg (ref 35.0–45.0)
pCO2 arterial: 46.9 mmHg — ABNORMAL HIGH (ref 35.0–45.0)
pH, Arterial: 7.347 — ABNORMAL LOW (ref 7.350–7.450)
pH, Arterial: 7.409 (ref 7.350–7.450)
pO2, Arterial: 45 mmHg — ABNORMAL LOW (ref 80.0–100.0)

## 2015-07-05 LAB — CBC
HEMATOCRIT: 43.1 % (ref 39.0–52.0)
Hemoglobin: 14.7 g/dL (ref 13.0–17.0)
MCH: 28.4 pg (ref 26.0–34.0)
MCHC: 34.1 g/dL (ref 30.0–36.0)
MCV: 83.4 fL (ref 78.0–100.0)
PLATELETS: 293 10*3/uL (ref 150–400)
RBC: 5.17 MIL/uL (ref 4.22–5.81)
RDW: 12.7 % (ref 11.5–15.5)
WBC: 9.6 10*3/uL (ref 4.0–10.5)

## 2015-07-05 LAB — PROTIME-INR
INR: 1.08 (ref 0.00–1.49)
Prothrombin Time: 14.2 seconds (ref 11.6–15.2)

## 2015-07-05 LAB — POCT ACTIVATED CLOTTING TIME: ACTIVATED CLOTTING TIME: 183 s

## 2015-07-05 LAB — BASIC METABOLIC PANEL
Anion gap: 12 (ref 5–15)
BUN: 15 mg/dL (ref 6–20)
CO2: 24 mmol/L (ref 22–32)
CREATININE: 1.03 mg/dL (ref 0.61–1.24)
Calcium: 9.6 mg/dL (ref 8.9–10.3)
Chloride: 103 mmol/L (ref 101–111)
GFR calc Af Amer: >60 mL/min (ref >60)
GFR calc non Af Amer: >60 mL/min (ref >60)
Glucose, Bld: 112 mg/dL — ABNORMAL HIGH (ref 65–99)
POTASSIUM: 3.9 mmol/L (ref 3.5–5.1)
Sodium: 139 mmol/L (ref 135–145)

## 2015-07-05 LAB — POCT I-STAT 3, VENOUS BLOOD GAS (G3P V)
ACID-BASE EXCESS: 1 mmol/L (ref 0.0–2.0)
Acid-Base Excess: 2 mmol/L (ref 0.0–2.0)
Bicarbonate: 26 mEq/L — ABNORMAL HIGH (ref 20.0–24.0)
Bicarbonate: 26.5 mEq/L — ABNORMAL HIGH (ref 20.0–24.0)
O2 SAT: 60 %
O2 Saturation: 63 %
PH VEN: 7.41 — AB (ref 7.250–7.300)
TCO2: 27 mmol/L (ref 0–100)
TCO2: 28 mmol/L (ref 0–100)
pCO2, Ven: 41.6 mmHg — ABNORMAL LOW (ref 45.0–50.0)
pCO2, Ven: 41.8 mmHg — ABNORMAL LOW (ref 45.0–50.0)
pH, Ven: 7.404 — ABNORMAL HIGH (ref 7.250–7.300)
pO2, Ven: 31 mmHg (ref 31.0–45.0)
pO2, Ven: 33 mmHg (ref 31.0–45.0)

## 2015-07-05 LAB — FERRITIN: FERRITIN: 139 ng/mL (ref 24–336)

## 2015-07-05 LAB — MRSA PCR SCREENING: MRSA BY PCR: NEGATIVE

## 2015-07-05 LAB — CREATININE, SERUM
Creatinine, Ser: 1.49 mg/dL — ABNORMAL HIGH (ref 0.61–1.24)
GFR calc Af Amer: >60 mL/min (ref >60)
GFR, EST NON AFRICAN AMERICAN: 55 mL/min — AB (ref >60)

## 2015-07-05 SURGERY — RIGHT/LEFT HEART CATH AND CORONARY ANGIOGRAPHY
Anesthesia: LOCAL

## 2015-07-05 MED ORDER — HEPARIN (PORCINE) IN NACL 2-0.9 UNIT/ML-% IJ SOLN
INTRAMUSCULAR | Status: AC
  Filled 2015-07-05: qty 500

## 2015-07-05 MED ORDER — SODIUM CHLORIDE 0.9 % IV SOLN: 250.0000 mL | INTRAVENOUS | Status: DC | PRN

## 2015-07-05 MED ORDER — HEPARIN SODIUM (PORCINE) 1000 UNIT/ML IJ SOLN
INTRAMUSCULAR | Status: AC
  Filled 2015-07-05: qty 1

## 2015-07-05 MED ORDER — MIDAZOLAM HCL 2 MG/2ML IJ SOLN
INTRAMUSCULAR | Status: AC
  Filled 2015-07-05: qty 2

## 2015-07-05 MED ORDER — SODIUM CHLORIDE 0.9% FLUSH: 3.0000 mL | INTRAVENOUS | Status: DC | PRN

## 2015-07-05 MED ORDER — LIDOCAINE HCL (PF) 1 % IJ SOLN
INTRAMUSCULAR | Status: AC
  Filled 2015-07-05: qty 30

## 2015-07-05 MED ORDER — ONDANSETRON HCL 4 MG/2ML IJ SOLN
INTRAMUSCULAR | Status: DC | PRN
  Administered 2015-07-05: 4 mg via INTRAVENOUS

## 2015-07-05 MED ORDER — HEPARIN SODIUM (PORCINE) 1000 UNIT/ML IJ SOLN
INTRAMUSCULAR | Status: DC | PRN
  Administered 2015-07-05: 3500 [IU] via INTRAVENOUS

## 2015-07-05 MED ORDER — NITROGLYCERIN 1 MG/10 ML FOR IR/CATH LAB
INTRA_ARTERIAL | Status: DC | PRN
  Administered 2015-07-05: 200 ug via INTRA_ARTERIAL

## 2015-07-05 MED ORDER — FENTANYL CITRATE (PF) 100 MCG/2ML IJ SOLN
INTRAMUSCULAR | Status: DC | PRN
  Administered 2015-07-05: 50 ug via INTRAVENOUS
  Administered 2015-07-05: 25 ug via INTRAVENOUS

## 2015-07-05 MED ORDER — MIDAZOLAM HCL 2 MG/2ML IJ SOLN
INTRAMUSCULAR | Status: DC | PRN
  Administered 2015-07-05 (×2): 1 mg via INTRAVENOUS

## 2015-07-05 MED ORDER — VERAPAMIL HCL 2.5 MG/ML IV SOLN
INTRAVENOUS | Status: AC
  Filled 2015-07-05: qty 2

## 2015-07-05 MED ORDER — FENTANYL CITRATE (PF) 100 MCG/2ML IJ SOLN
INTRAMUSCULAR | Status: AC
  Filled 2015-07-05: qty 2

## 2015-07-05 MED ORDER — HEPARIN (PORCINE) IN NACL 2-0.9 UNIT/ML-% IJ SOLN
INTRAMUSCULAR | Status: DC | PRN
  Administered 2015-07-05: 1500 mL

## 2015-07-05 MED ORDER — SODIUM CHLORIDE 0.9% FLUSH
3.0000 mL | Freq: Two times a day (BID) | INTRAVENOUS | Status: DC
  Administered 2015-07-05 – 2015-07-08 (×3): 3 mL via INTRAVENOUS

## 2015-07-05 MED ORDER — IOPAMIDOL (ISOVUE-370) INJECTION 76%
INTRAVENOUS | Status: DC | PRN
  Administered 2015-07-05: 30 mL via INTRA_ARTERIAL

## 2015-07-05 MED ORDER — NITROGLYCERIN 1 MG/10 ML FOR IR/CATH LAB
INTRA_ARTERIAL | Status: AC
  Filled 2015-07-05: qty 10

## 2015-07-05 MED ORDER — ONDANSETRON HCL 4 MG/2ML IJ SOLN
INTRAMUSCULAR | Status: AC
  Filled 2015-07-05: qty 2

## 2015-07-05 MED ORDER — DEXTROSE 5 % IV SOLN
4.0000 mg | INTRAVENOUS | Status: DC | PRN
  Administered 2015-07-05: 20 ug/min via INTRAVENOUS

## 2015-07-05 MED ORDER — LIDOCAINE HCL (PF) 1 % IJ SOLN
INTRAMUSCULAR | Status: DC | PRN
  Administered 2015-07-05 (×2): 5 mL

## 2015-07-05 MED ORDER — IOPAMIDOL (ISOVUE-370) INJECTION 76%
INTRAVENOUS | Status: AC
  Filled 2015-07-05: qty 100

## 2015-07-05 MED ORDER — EPINEPHRINE HCL 0.1 MG/ML IJ SOSY
PREFILLED_SYRINGE | INTRAMUSCULAR | Status: AC
  Filled 2015-07-05: qty 10

## 2015-07-05 MED ORDER — VERAPAMIL HCL 2.5 MG/ML IV SOLN
INTRAVENOUS | Status: DC | PRN
  Administered 2015-07-05 (×2): 10 mL via INTRA_ARTERIAL

## 2015-07-05 MED ORDER — SODIUM CHLORIDE 0.9% FLUSH
3.0000 mL | Freq: Two times a day (BID) | INTRAVENOUS | Status: DC
  Administered 2015-07-06: 3 mL via INTRAVENOUS

## 2015-07-05 MED ORDER — ENOXAPARIN SODIUM 40 MG/0.4ML ~~LOC~~ SOLN
40.0000 mg | SUBCUTANEOUS | Status: DC
  Administered 2015-07-06 – 2015-07-08 (×3): 40 mg via SUBCUTANEOUS
  Filled 2015-07-05 (×3): qty 0.4

## 2015-07-05 MED ORDER — NOREPINEPHRINE BITARTRATE 1 MG/ML IV SOLN
0.0000 ug/min | INTRAVENOUS | Status: DC
  Administered 2015-07-05: 4 ug/min via INTRAVENOUS
  Filled 2015-07-05 (×2): qty 4

## 2015-07-05 MED ORDER — ACETAMINOPHEN 325 MG PO TABS: 650.0000 mg | ORAL_TABLET | ORAL | Status: DC | PRN

## 2015-07-05 MED ORDER — NOREPINEPHRINE BITARTRATE 1 MG/ML IV SOLN
INTRAVENOUS | Status: AC
  Filled 2015-07-05: qty 4

## 2015-07-05 MED ORDER — ONDANSETRON HCL 4 MG/2ML IJ SOLN: 4.0000 mg | Freq: Four times a day (QID) | INTRAMUSCULAR | Status: DC | PRN

## 2015-07-05 MED ORDER — EPINEPHRINE HCL 0.1 MG/ML IJ SOSY
PREFILLED_SYRINGE | INTRAMUSCULAR | Status: DC | PRN
Start: 2015-07-05 — End: 2015-07-05
  Administered 2015-07-05: .5 via INTRAVENOUS

## 2015-07-05 SURGICAL SUPPLY — 12 items
CATH BALLN WEDGE 5F 110CM (CATHETERS) ×2 IMPLANT
CATH INFINITI 4FR JL3.5 (CATHETERS) ×2 IMPLANT
CATH INFINITI JR4 5F (CATHETERS) ×2 IMPLANT
GLIDESHEATH SLEND SS 6F .021 (SHEATH) ×2 IMPLANT
KIT HEART LEFT (KITS) ×2 IMPLANT
PACK CARDIAC CATHETERIZATION (CUSTOM PROCEDURE TRAY) ×2 IMPLANT
SHEATH FAST CATH BRACH 5F 5CM (SHEATH) ×2 IMPLANT
SYR MEDRAD MARK V 150ML (SYRINGE) ×2 IMPLANT
TRANSDUCER W/STOPCOCK (MISCELLANEOUS) ×2 IMPLANT
TUBING CIL FLEX 10 FLL-RA (TUBING) ×2 IMPLANT
WIRE HI TORQ VERSACORE-J 145CM (WIRE) ×2 IMPLANT
WIRE SAFE-T 1.5MM-J .035X260CM (WIRE) ×2 IMPLANT

## 2015-07-05 NOTE — Progress Notes (Signed)
Pt has orders for transfer to [facility] post cardiac catheterization. Pt belongings delivered to [facility], report given to [facility] down on [place].

## 2015-07-05 NOTE — H&P (View-Only) (Signed)
Advanced Heart Failure Rounding Note  Referring Physician: Dr. Chinita Pester Primary Physician Minerva Ends, MD Primary Cardiologist: New Reason for Consultation: CHF  Subjective:    Echo 07/04/15 LVEF 10-15%, normal RV  Feeling OK this morning. Peeing well on low dose torsemide. States he still occasionally has some orthopnea, but he can get into a comfortable position.  No lightheadedness or dizziness. No CP.   Out 400 cc and weight up 1 lb on 10 mg torsemide daily.   Objective:   Weight Range: 161 lb 6.4 oz (73.211 kg) Body mass index is 24.55 kg/(m^2).   Vital Signs:   Temp:  [97.7 F (36.5 C)-98.3 F (36.8 C)] 97.7 F (36.5 C) (04/17 0450) Pulse Rate:  [82-95] 82 (04/17 0450) Resp:  [16-18] 18 (04/17 0450) BP: (101-116)/(70-88) 116/88 mmHg (04/17 0450) SpO2:  [94 %-98 %] 94 % (04/17 0450) Weight:  [161 lb 6.4 oz (73.211 kg)] 161 lb 6.4 oz (73.211 kg) (04/17 0450) Last BM Date: 07/02/15  Weight change: Filed Weights   07/03/15 0503 07/04/15 0510 07/05/15 0450  Weight: 163 lb 14.4 oz (74.345 kg) 160 lb 8 oz (72.802 kg) 161 lb 6.4 oz (73.211 kg)    Intake/Output:   Intake/Output Summary (Last 24 hours) at 07/05/15 0750 Last data filed at 07/05/15 0600  Gross per 24 hour  Intake    640 ml  Output   1075 ml  Net   -435 ml     Physical Exam: General:  Well appearing. No resp difficulty HEENT: normal Neck: supple. JVP 6-7 cm. Carotids 2+ bilat; no bruits. No lymphadenopathy or thyromegaly appreciated. Cor: PMI nondisplaced. Regular rate & rhythm. No rubs, gallops or murmurs. Lungs: clear Abdomen: soft, NT, ND, no HSM. No bruits or masses. +BS  Extremities: no cyanosis, clubbing, rash, edema Neuro: alert & orientedx3, cranial nerves grossly intact. moves all 4 extremities w/o difficulty. Affect pleasant  Telemetry: Reviewed, NSR 80s  Labs: CBC  Recent Labs  07/02/15 1203  WBC 5.0  HGB 13.2  HCT 40.0  MCV 83.7  PLT [id number]   Basic Metabolic  Panel  Recent Labs  07/03/15 0420 07/05/15 0230  NA 141 139  K 3.8 3.9  CL 105 103  CO2 24 24  GLUCOSE 113* 112*  BUN 17 15  CREATININE 1.25* 1.03  CALCIUM 9.1 9.6   Liver Function Tests  Recent Labs  07/02/15 1707  AST 25  ALT 38  ALKPHOS 50  BILITOT 1.4*  PROT 6.7  ALBUMIN 4.0   No results for input(s): LIPASE, AMYLASE in the last 72 hours. Cardiac Enzymes  Recent Labs  07/02/15 1707 07/02/15 2253 07/03/15 0420  TROPONINI 0.04* 0.05* 0.04*    BNP: BNP (last 3 results)  Recent Labs  07/02/15 1203  BNP 545.5*    ProBNP (last 3 results) No results for input(s): PROBNP in the last 8760 hours.   D-Dimer  Recent Labs  07/02/15 1343  DDIMER 0.34   Hemoglobin A1C  Recent Labs  07/02/15 1700  HGBA1C 6.0*   Fasting Lipid Panel  Recent Labs  07/02/15 1700  CHOL 239*  HDL 43  LDLCALC 172*  TRIG 122  CHOLHDL 5.6   Thyroid Function Tests  Recent Labs  07/02/15 1700  TSH 0.910    Other results:     Imaging/Studies:   No results found.  Latest Echo  Latest Cath   Medications:     Scheduled Medications: . aspirin EC  81 mg Oral Daily  .  atorvastatin  80 mg Oral q1800  . digoxin  0.25 mg Oral Daily  . enoxaparin (LOVENOX) injection  40 mg Subcutaneous Q24H  . losartan  25 mg Oral Daily  . pantoprazole  40 mg Oral Daily  . sodium chloride flush  3 mL Intravenous Q12H  . sodium chloride flush  3 mL Intravenous Q12H  . spironolactone  12.5 mg Oral Daily  . torsemide  10 mg Oral Daily     Infusions: . sodium chloride 10 mL/hr at 07/05/15 0612     PRN Medications:  sodium chloride, acetaminophen, albuterol, diphenhydrAMINE, nitroGLYCERIN, sodium chloride flush   Assessment   1. Acute systolic CHF: Echo 99991111  LVEF 10-15% 2. HLD 3. TIA 4. HTN  Plan    Plan for cath this afternoon for ischemic work up and assessment of cardiac output.   Ferritin stable. Will order Hepatitis panel.   Continue  atorvastatin 80 mg daily for LDL of 172. Will need to be recheck in 3-4 months.   Length of Stay: 2   Shirley Friar PA-C 07/05/2015, 7:50 AM  Advanced Heart Failure Team Pager [phone] (M-F; 7a - 4p)  Please contact [facility] for night-coverage after hours (4p -7a ) and weekends on [email or web address]  Patient seen and examined with Oda Kilts, PA-C. We discussed all aspects of the encounter. I agree with the assessment and plan as stated above.   Improving. Volume status still mildly elevated. Will plan R/L cath later today.   Agree with statin for HL.   Bensimhon, Daniel,MD 9:29 AM

## 2015-07-05 NOTE — Progress Notes (Signed)
Advanced Heart Failure Rounding Note  Referring Physician: Dr. Chinita Pester Primary Physician Minerva Ends, MD Primary Cardiologist: New Reason for Consultation: CHF  Subjective:    Echo 07/04/15 LVEF 10-15%, normal RV  Feeling OK this morning. Peeing well on low dose torsemide. States he still occasionally has some orthopnea, but he can get into a comfortable position.  No lightheadedness or dizziness. No CP.   Out 400 cc and weight up 1 lb on 10 mg torsemide daily.   Objective:   Weight Range: 161 lb 6.4 oz (73.211 kg) Body mass index is 24.55 kg/(m^2).   Vital Signs:   Temp:  [97.7 F (36.5 C)-98.3 F (36.8 C)] 97.7 F (36.5 C) (04/17 0450) Pulse Rate:  [82-95] 82 (04/17 0450) Resp:  [16-18] 18 (04/17 0450) BP: (101-116)/(70-88) 116/88 mmHg (04/17 0450) SpO2:  [94 %-98 %] 94 % (04/17 0450) Weight:  [161 lb 6.4 oz (73.211 kg)] 161 lb 6.4 oz (73.211 kg) (04/17 0450) Last BM Date: 07/02/15  Weight change: Filed Weights   07/03/15 0503 07/04/15 0510 07/05/15 0450  Weight: 163 lb 14.4 oz (74.345 kg) 160 lb 8 oz (72.802 kg) 161 lb 6.4 oz (73.211 kg)    Intake/Output:   Intake/Output Summary (Last 24 hours) at 07/05/15 0750 Last data filed at 07/05/15 0600  Gross per 24 hour  Intake    640 ml  Output   1075 ml  Net   -435 ml     Physical Exam: General:  Well appearing. No resp difficulty HEENT: normal Neck: supple. JVP 6-7 cm. Carotids 2+ bilat; no bruits. No lymphadenopathy or thyromegaly appreciated. Cor: PMI nondisplaced. Regular rate & rhythm. No rubs, gallops or murmurs. Lungs: clear Abdomen: soft, NT, ND, no HSM. No bruits or masses. +BS  Extremities: no cyanosis, clubbing, rash, edema Neuro: alert & orientedx3, cranial nerves grossly intact. moves all 4 extremities w/o difficulty. Affect pleasant  Telemetry: Reviewed, NSR 80s  Labs: CBC  Recent Labs  07/02/15 1203  WBC 5.0  HGB 13.2  HCT 40.0  MCV 83.7  PLT [id number]   Basic Metabolic  Panel  Recent Labs  07/03/15 0420 07/05/15 0230  NA 141 139  K 3.8 3.9  CL 105 103  CO2 24 24  GLUCOSE 113* 112*  BUN 17 15  CREATININE 1.25* 1.03  CALCIUM 9.1 9.6   Liver Function Tests  Recent Labs  07/02/15 1707  AST 25  ALT 38  ALKPHOS 50  BILITOT 1.4*  PROT 6.7  ALBUMIN 4.0   No results for input(s): LIPASE, AMYLASE in the last 72 hours. Cardiac Enzymes  Recent Labs  07/02/15 1707 07/02/15 2253 07/03/15 0420  TROPONINI 0.04* 0.05* 0.04*    BNP: BNP (last 3 results)  Recent Labs  07/02/15 1203  BNP 545.5*    ProBNP (last 3 results) No results for input(s): PROBNP in the last 8760 hours.   D-Dimer  Recent Labs  07/02/15 1343  DDIMER 0.34   Hemoglobin A1C  Recent Labs  07/02/15 1700  HGBA1C 6.0*   Fasting Lipid Panel  Recent Labs  07/02/15 1700  CHOL 239*  HDL 43  LDLCALC 172*  TRIG 122  CHOLHDL 5.6   Thyroid Function Tests  Recent Labs  07/02/15 1700  TSH 0.910    Other results:     Imaging/Studies:   No results found.  Latest Echo  Latest Cath   Medications:     Scheduled Medications: . aspirin EC  81 mg Oral Daily  .  atorvastatin  80 mg Oral q1800  . digoxin  0.25 mg Oral Daily  . enoxaparin (LOVENOX) injection  40 mg Subcutaneous Q24H  . losartan  25 mg Oral Daily  . pantoprazole  40 mg Oral Daily  . sodium chloride flush  3 mL Intravenous Q12H  . sodium chloride flush  3 mL Intravenous Q12H  . spironolactone  12.5 mg Oral Daily  . torsemide  10 mg Oral Daily     Infusions: . sodium chloride 10 mL/hr at 07/05/15 0612     PRN Medications:  sodium chloride, acetaminophen, albuterol, diphenhydrAMINE, nitroGLYCERIN, sodium chloride flush   Assessment   1. Acute systolic CHF: Echo 99991111  LVEF 10-15% 2. HLD 3. TIA 4. HTN  Plan    Plan for cath this afternoon for ischemic work up and assessment of cardiac output.   Ferritin stable. Will order Hepatitis panel.   Continue  atorvastatin 80 mg daily for LDL of 172. Will need to be recheck in 3-4 months.   Length of Stay: 2   Shirley Friar PA-C 07/05/2015, 7:50 AM  Advanced Heart Failure Team Pager [phone] (M-F; 7a - 4p)  Please contact [facility] for night-coverage after hours (4p -7a ) and weekends on [email or web address]  Patient seen and examined with Oda Kilts, PA-C. We discussed all aspects of the encounter. I agree with the assessment and plan as stated above.   Improving. Volume status still mildly elevated. Will plan R/L cath later today.   Agree with statin for HL.   Bensimhon, Daniel,MD 9:29 AM

## 2015-07-05 NOTE — Discharge Summary (Signed)
Name: Jerome Barnett MRN: [id number] DOB: 11/10/1970 45 y.o. PCP: No primary care provider on file.  Date of Admission: 07/02/2015 12:03 PM Date of Discharge: 07/08/2015 Attending Physician: Bartholomew Crews, MD  Discharge Diagnosis: 1. HFrEF 2. TIA   Principal Problem:   TIA (transient ischemic attack) Active Problems:   HTN (hypertension)   GERD (gastroesophageal reflux disease)   Chest pain   Acute respiratory failure (HCC)   Acute on chronic systolic congestive heart failure (HCC)   Hyperlipidemia   Acute systolic heart failure ([place])  Discharge Medications:   Medication List    STOP taking these medications        amLODipine 10 MG tablet  Commonly known as:  NORVASC      TAKE these medications        acetaminophen 325 MG tablet  Commonly known as:  TYLENOL  Take 650 mg by mouth every 6 (six) hours as needed for mild pain.     albuterol 108 (90 Base) MCG/ACT inhaler  Commonly known as:  PROVENTIL HFA;VENTOLIN HFA  Inhale 2 puffs into the lungs every 6 (six) hours as needed for wheezing or shortness of breath.     aspirin 81 MG chewable tablet  Chew 1 tablet (81 mg total) by mouth daily.     atorvastatin 80 MG tablet  Commonly known as:  LIPITOR  Take 1 tablet (80 mg total) by mouth daily at 6 PM.     carvedilol 3.125 MG tablet  Commonly known as:  COREG  Take 1 tablet (3.125 mg total) by mouth 2 (two) times daily with a meal.     digoxin 0.25 MG tablet  Commonly known as:  LANOXIN  Take 1 tablet (0.25 mg total) by mouth daily.     ibuprofen 200 MG tablet  Commonly known as:  ADVIL,MOTRIN  Take 200 mg by mouth every 6 (six) hours as needed for moderate pain.     NYQUIL COLD & FLU PO  Take 30 mLs by mouth every 8 (eight) hours as needed (fever).     omeprazole 20 MG capsule  Commonly known as:  PRILOSEC  Take 1 capsule (20 mg total) by mouth daily.     polyethylene glycol packet  Commonly known as:  MIRALAX / GLYCOLAX  Take 17 g by  mouth daily.     sacubitril-valsartan 24-26 MG  Commonly known as:  ENTRESTO  Take 1 tablet by mouth 2 (two) times daily.     spironolactone 25 MG tablet  Commonly known as:  ALDACTONE  Take 0.5 tablets (12.5 mg total) by mouth daily.        Disposition and follow-up:   Mr.Craigory Lurie was discharged from [facility] in Stable condition.  At the hospital follow up visit please address:  1.  Medication adherence, volume status, need for diuretics, digoxin level  2.  Labs / imaging needed at time of follow-up: BMP, Digoxin level  3.  Pending labs/ test needing follow-up: none  Follow-up Appointments: Follow-up Information    Follow up with Loleta Chance, MD On 07/20/2015.   Specialty:  Internal Medicine   Why:  10:15a   Contact information:   [place] [address] [phone]       Follow up with Glori Bickers, MD On 07/15/2015.   Specialty:  Cardiology   Why:  at 11:20 [place] information:   [address] [place] [address] [phone]  Discharge Instructions: Discharge Instructions    Call MD for:  difficulty breathing, headache or visual disturbances    Complete by:  As directed      Call MD for:  persistant dizziness or light-headedness    Complete by:  As directed      Diet - low sodium heart healthy    Complete by:  As directed      Increase activity slowly    Complete by:  As directed            Consultations:    Procedures Performed:  Dg Chest 2 View  07/02/2015  CLINICAL DATA:  Extreme shortness of breath, central chest pain and dizziness for 4 days, history hypertension, former smoker, recent dental infection EXAM: CHEST  2 VIEW COMPARISON:  07/13/2014 FINDINGS: Enlargement of cardiac silhouette. Mediastinal contours and pulmonary vascularity normal. Chronic bronchitic changes. No pulmonary infiltrate, pleural effusion or pneumothorax. No acute osseous  findings. IMPRESSION: Enlargement of cardiac silhouette. Chronic bronchitic changes without acute abnormality. Electronically Signed   By: Lavonia Dana M.D.   On: 07/02/2015 12:20   Ct Head Wo Contrast  07/02/2015  CLINICAL DATA:  Right upper extremity weakness for 1 day EXAM: CT HEAD WITHOUT CONTRAST TECHNIQUE: Contiguous axial images were obtained from the base of the skull through the vertex without intravenous contrast. COMPARISON:  None. FINDINGS: The ventricles are normal in size and configuration. There is no intracranial mass, hemorrhage, extra-axial fluid collection, or midline shift. The gray-white compartments are normal. There is no acute infarct evident. Bony calvarium appears intact. Mastoid air cells are clear. No intraorbital lesions are evident. There is mucosal thickening in the left maxillary antrum. There is also mild mucosal thickening of several ethmoid air cells. There is leftward deviation of the nasal septum. There is a prominent concha bullosa on the right, an anatomic variant. IMPRESSION: Paranasal sinus disease, most notable in the left maxillary antrum. No intracranial mass, hemorrhage, or focal gray-white compartment lesions/acute appearing infarct. Electronically Signed   By: Lowella Grip III M.D.   On: 07/02/2015 17:50   Mr Brain Wo Contrast  07/02/2015  CLINICAL DATA:  One week history of dizziness. Presyncope. Episodic RIGHT arm weakness. EXAM: MRI HEAD WITHOUT CONTRAST TECHNIQUE: Multiplanar, multiecho pulse sequences of the brain and surrounding structures were obtained without intravenous contrast. COMPARISON:  CT head earlier today. FINDINGS: No evidence for acute infarction, hemorrhage, mass lesion, hydrocephalus, or extra-axial fluid. Normal cerebral volume. Minor white matter disease, no significant white matter disease. Flow voids are maintained throughout the carotid, basilar, and vertebral arteries. There are no areas of chronic hemorrhage. Pituitary, pineal, and  cerebellar tonsils unremarkable. No upper cervical lesions. Acute and chronic LEFT maxillary sinus disease. Nasal septal deviation RIGHT to LEFT with large RIGHT concha bullosa. Negative orbits. No mastoid or middle ear fluid. Visualized extracranial soft tissues unremarkable. IMPRESSION: No acute or focal intracranial abnormalities. No evidence for large vessel occlusion. Acute and chronic LEFT maxillary sinusitis. Electronically Signed   By: Staci Righter M.D.   On: 07/02/2015 21:19   Dg Chest Port 1 View  07/06/2015  CLINICAL DATA:  Central catheter placement EXAM: PORTABLE CHEST 1 VIEW COMPARISON:  July 02, 2015 FINDINGS: Central catheter tip is in the superior vena cava. No pneumothorax. There is no edema or consolidation. Heart is upper normal in size with pulmonary vascularity within normal limits. No adenopathy. No bone lesions. IMPRESSION: Central catheter tip in superior vena cava. No pneumothorax. Lungs clear. Electronically Signed   By:  Lowella Grip III M.D.   On: 07/06/2015 12:50    2D Echo: Study Conclusions  - Left ventricle: The cavity size was moderately dilated. Wall  thickness was increased in a pattern of mild LVH. Left  ventricular geometry showed evidence of eccentric hypertrophy.  Systolic function was severely reduced. The estimated ejection  fraction was in the range of 10% to 15%. Severe diffuse  hypokinesis with no identifiable regional variations. No evidence  of thrombus. - Aortic valve: There was mild regurgitation. - Left atrium: The atrium was moderately dilated.  Cardiac Cath: Findings:  RA = 2 RV = 28/0/4 PA = 28/11 (18) PCW = 5 Fick cardiac output/index = 4.3/2.3 PVR = 3.0 WU FA sat = 94% PA sat = 60%, 63%  Assessment: 1. Normal coronary arteries 2. Low filling pressures with normal cardiac output 3. Severe NICM with EF 10% by echo 3. Development of severe hypotension and shock in cath lab due to vasodilators to treat radial artery  spasm  Admission HPI: Mr. Carbin is a 45 yo male with HTN and GERD, presenting with one week h/o intermittent SOB, chest pressure, and dizziness. He reports intermittent SOB and dizziness over the last week, sometimes a/w chest pressure. He reports he is SOB at rest, worse with lying flat and decreased with lying prone or reclined. He denies leg swelling. He can only climb one flight of stairs before becoming SOB. He sleeps on 2 pillows. His chest pressure is also worst at night, and is a/w wheezing. He denies palpitations. He has a h/o HTN, previously controlled, though he has not taken his Amlodipine in ~6 months. He has a 40 pack-year smoking history (2ppd x 20 years), but quit 2 years ago. He denies a h/o CHF, HLD, DMII, MI, or stroke.   Today he also complains of small stabbing pains to his sternum a/w worsened SOB and diaphoresis. He denies associated nausea. The pain last a few minutes, but recurs. It is a similar pain to an ED presentation one year ago.   This morning, during an episode of dizziness during which he thought he may lose consciousness, he reports his right arm going limp at his side. The episode lasted about 5 minutes before resolving. Additionally, on his way to the ED this afternoon, he noted numbness/tingling of right fingers. He is still complaining of numbness/tingling, but believes it may now be due to his recent blood draw. He denies dysarthria, other focal weakness, LOC, or bowel/bladder incontinence.   He has a questionable history of COPD, which may been mentioned by his PCP. His wife also reports intermittent episodes recently of the patient not breathing at night. Patient endorses waking up SOB at night and tiredness during the day.  FHx: No family h/o MI, CHF, or stroke. 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 He has a 40 pack-year smoking history. He may drink twice a year. He denies  other drugs.  In the ED, patient's troponin and D-dimer were negative. His BNP was elevated to >500. CXR was notable for chronic bronchitic changes and cardiomegaly  Hospital Course by problem list: Principal Problem:   TIA (transient ischemic attack) Active Problems:   HTN (hypertension)   GERD (gastroesophageal reflux disease)   Chest pain   Acute respiratory failure (HCC)   Acute on chronic systolic congestive heart failure (HCC)   Hyperlipidemia   Acute systolic heart failure ([place])  HFrEF: Patient presented with SOB and orthopnea, possibly following URI about 2 weeks ago.  Echo demonstrated EF 10-15%.  His risk factors for CHF include hyperlipidemia and hypertension.  He is prediabetic.  Given the recent URI, etiology is possibly viral myocarditis.  [place] cath was negative for ischemic disease.  He was discharged on Atorvastatin 80 mg, Entresto 24-26 mg BID, Carvedilol 3.125 mg BID, Spironolactone 12.5 mg, Digoxin 0.25 mg, and ASA.  Torsemide was held on discharge in order to not over-diurese patient.  He had a mild allergic reaction to Furosemide, with right eyelid swelling without respiratory complication.  He did not have a similar reaction to Torsemide.  Patient will need a repeat echo in 6 months to determine whether his cardiomyopathy has reversed.  TIA: Patient reported intermittent episodes of right arm weakness and numbness/tingling that would resolve spontaneously.  CT/MRI head were negative for infarct.  Patient was started on ASA and HTN/HLD drugs as above.   Prediabetes: Patient's A1c on presentation was 6.0%.  He was counseled on diet and lifestyle changes.  Discharge Vitals:   BP 116/90 mmHg  Pulse 84  Temp(Src) 97.8 F (36.6 C) (Oral)  Resp 16  Ht 5\' 8"  (1.727 m)  Wt 160 lb 14.4 oz (72.984 kg)  BMI 24.47 kg/m2  SpO2 100%  Discharge Labs:  Results for orders placed or performed during the hospital encounter of 07/02/15 (from the past 24 hour(s))  Basic  metabolic panel     Status: Abnormal   Collection Time: 07/08/15  5:20 AM  Result Value Ref Range   Sodium 137 135 - 145 mmol/L   Potassium 4.1 3.5 - 5.1 mmol/L   Chloride 102 101 - 111 mmol/L   CO2 24 22 - 32 mmol/L   Glucose, Bld 118 (H) 65 - 99 mg/dL   BUN 13 6 - 20 mg/dL   Creatinine, Ser 0.81 0.61 - 1.24 mg/dL   Calcium 9.4 8.9 - 10.3 mg/dL   GFR calc non Af Amer >60 >60 mL/min   GFR calc Af Amer >60 >60 mL/min   Anion gap 11 5 - 15  Digoxin level     Status: Abnormal   Collection Time: 07/08/15  5:20 AM  Result Value Ref Range   Digoxin Level 0.5 (L) 0.8 - 2.0 ng/mL    Signed: Iline Oven, MD 07/08/2015, 10:53 AM    Services Ordered on Discharge: none Equipment Ordered on Discharge: none

## 2015-07-05 NOTE — Care Management Note (Signed)
Case Management Note  Patient Details  Name: Jerome Barnett MRN: [id number] Date of Birth: [birth year]  Subjective/Objective:       Pt transf to ccu today for shortness of breath             Action/Plan: lived at home pta   Expected Discharge Date:                  Expected Discharge Plan:  Home/Self Care  In-House Referral:     Discharge planning Services     Post Acute Care Choice:    Choice offered to:     DME Arranged:    DME Agency:     HH Arranged:    [place] Agency:     Status of Service:     Medicare Important Message Given:    Date Medicare IM Given:    Medicare IM give by:    Date Additional Medicare IM Given:    Additional Medicare Important Message give by:     If discussed at [place] of Stay Meetings, dates discussed:    Additional Comments: will moniter for dc needs as pt progresses  Lacretia Leigh, RN 07/05/2015, 3:55 PM

## 2015-07-05 NOTE — Interval H&P Note (Signed)
History and Physical Interval Note:  07/05/2015 1:33 PM  Jerome Barnett  has presented today for surgery, with the diagnosis of HF  The various methods of treatment have been discussed with the patient and family. After consideration of risks, benefits and other options for treatment, the patient has consented to  Procedure(s): Right/Left Heart Cath and Coronary Angiography (N/A) and possible angioplasty as a surgical intervention .  The patient's history has been reviewed, patient examined, no change in status, stable for surgery.  I have reviewed the patient's chart and labs.  Questions were answered to the patient's satisfaction.     Briella Hobday, Quillian Quince

## 2015-07-05 NOTE — Progress Notes (Signed)
Subjective: Jerome Barnett.  He complains of intermittent sternal chest pressure, that occurs at rest when lying flat.  However, he is able to lie flat without dyspnea.  In retrospect, he does remember having a cold-like virus approximately 2 weeks ago.  Objective: Vital signs in last 24 hours: Filed Vitals:   07/04/15 0510 07/04/15 1240 07/04/15 2000 07/05/15 0450  BP: 124/96 107/82 101/70 116/88  Pulse: 96 95 93 82  Temp: 98.2 F (36.8 C) 98.2 F (36.8 C) 98.3 F (36.8 C) 97.7 F (36.5 C)  TempSrc: Oral Oral Oral Oral  Resp: 18 18 16 18   Height:      Weight: 160 lb 8 oz (72.802 kg)   161 lb 6.4 oz (73.211 kg)  SpO2: 98% 98% 97% 94%   Weight change: 14.4 oz (0.408 kg)  Intake/Output Summary (Last 24 hours) at 07/05/15 0703 Last data filed at 07/05/15 0600  Gross per 24 hour  Intake    640 ml  Output   1075 ml  Net   -435 ml   Physical Exam  Constitutional: He is oriented to person, place, and time and well-developed, well-nourished, and in no distress. No distress.  HENT:  Head: Normocephalic and atraumatic.  Eyes: EOM are normal. Pupils are equal, round, and reactive to light.  Right eyelid no longer swollen.  Neck: No tracheal deviation present.  Cardiovascular: Normal rate, regular rhythm and normal heart sounds.   Pulmonary/Chest: Effort normal and breath sounds normal. No stridor. No respiratory distress. He has no wheezes.  No crackles appreciated.  Abdominal: Soft. He exhibits no distension. There is no tenderness. There is no rebound and no guarding.  Musculoskeletal: He exhibits no edema.  Neurological: He is alert and oriented to person, place, and time.  Skin: Skin is warm and dry. He is not diaphoretic.    Lab Results: Basic Metabolic Panel:  Recent Labs Lab 07/03/15 0420 07/05/15 0230  NA 141 139  K 3.8 3.9  CL 105 103  CO2 24 24  GLUCOSE 113* 112*  BUN 17 15  CREATININE 1.25* 1.03  CALCIUM 9.1 9.6   Liver Function Tests:  Recent Labs Lab  07/02/15 1707  AST 25  ALT 38  ALKPHOS 50  BILITOT 1.4*  PROT 6.7  ALBUMIN 4.0   CBC:  Recent Labs Lab 07/02/15 1203  WBC 5.0  HGB 13.2  HCT 40.0  MCV 83.7  PLT 226   Cardiac Enzymes:  Recent Labs Lab 07/02/15 1707 07/02/15 2253 07/03/15 0420  TROPONINI 0.04* 0.05* 0.04*   D-Dimer:  Recent Labs Lab 07/02/15 1343  DDIMER 0.34   Hemoglobin A1C:  Recent Labs Lab 07/02/15 1700  HGBA1C 6.0*   Fasting Lipid Panel:  Recent Labs Lab 07/02/15 1700  CHOL 239*  HDL 43  LDLCALC 172*  TRIG 122  CHOLHDL 5.6   Thyroid Function Tests:  Recent Labs Lab 07/02/15 1700  TSH 0.910   Studies/Results: No results found. Medications: I have reviewed the patient's current medications. Scheduled Meds: . aspirin EC  81 mg Oral Daily  . atorvastatin  80 mg Oral q1800  . digoxin  0.25 mg Oral Daily  . enoxaparin (LOVENOX) injection  40 mg Subcutaneous Q24H  . losartan  25 mg Oral Daily  . pantoprazole  40 mg Oral Daily  . sodium chloride flush  3 mL Intravenous Q12H  . sodium chloride flush  3 mL Intravenous Q12H  . spironolactone  12.5 mg Oral Daily  . torsemide  10 mg Oral  Daily   Continuous Infusions: . sodium chloride 10 mL/hr at 07/05/15 0612   PRN Meds:.sodium chloride, acetaminophen, albuterol, diphenhydrAMINE, nitroGLYCERIN, sodium chloride flush Assessment/Plan:  Mr. Kusiak is a 45 yo man with HTN and GERD, presenting with one week h/o intermittent SOB, chest pressure, and dizziness.  Acute HFrEF, possibly acute viral cardiomyopathy: Patient presented with orthopnea, inspiratory crackles, elevated BNP, LVH, cardiomegaly, h/o uncontrolled HTN, and recent URI. Echo shows LVH and EF 10-15%.  This is likely worsened by some degree of underlying OSA.He had a mild allergic reaction to lasix (see below), but tolerated torsemide well. Due to extremely low EF, Heart Failure team recommending right and left heart cath to r/o underlying ICM.  They have also  started ARB, Digoxin, and Spironolactone.  Will ask HF for recommendation re: ICD placement. [name] - ASA - Nitro - Torsemide 10 mg PO  - Albuterol q6h PRN - Digoxin .25 mg - Spironolactone 12.5 mg - Losartan 25 mg daily - Atorvastatin 80 mg - Daily weights. - Strict I/Os [ ]  Right/Left Cath [ ]  Hepatitis panel  TIA: Patients description of transient arm weakness and numbness/tingling, as well as uncontrolled HTN certainly concerning for TIAs. No focal findings of neuro exam in the ED. His CT and MRI brain were negative for acute stroke. No LOC or bowel/bladder incontinence. No h/o afib, though he has had palpitations in the past. Lipids elevated.  A1c in prediabetes range. His carotid dopplers were negative for any IC stenosis. No evidence of LV thrombus on echo. [name] - ASA - Losartan 25 mg daily - Atorvastatin 80 mg daily   Allergy: Patient had mild allergic reaction to Furosemide with right eye swelling and injection without respiratory symptoms.  He reports a similar reaction to Amoxicillin taken a week ago.  There should not be cross-reactivity between these drugs.  However, the pharmacist Dr. Denice Paradise has provided a NEJM paper that demonstrates patients who demonstrate reactions to both sulfa and non-sulfa drugs is not due to a direct cross-reactivity, but a predisposition to multiple allergies. His swelling has subsided and he tolerated Torsemide well.  - Avoid Amoxicillin and Lasix if possible  OSA: Wife reports symptoms of OSA. Patient will need outpatient sleep study.  GERD: Protonix  FEN/GI: HH DVT Ppx: Lovenox  Dispo: Discharge likely today after echo completed   The patient does have a current PCP (Boykin Nearing, MD) and does need an [facility] hospital follow-up appointment after discharge.  The patient does not have transportation limitations that hinder transportation to clinic appointments.  .Services Needed at time of discharge: Y = Yes, Blank = No PT:   OT:     RN:   Equipment:   Other:     LOS: 2 days   Iline Oven, MD 07/05/2015, 7:03 AM

## 2015-07-06 ENCOUNTER — Telehealth: Payer: Self-pay | Admitting: Internal Medicine

## 2015-07-06 ENCOUNTER — Inpatient Hospital Stay (HOSPITAL_COMMUNITY): Payer: Self-pay

## 2015-07-06 ENCOUNTER — Encounter (HOSPITAL_COMMUNITY): Payer: Self-pay | Admitting: Internal Medicine

## 2015-07-06 DIAGNOSIS — I509 Heart failure, unspecified: Secondary | ICD-10-CM

## 2015-07-06 LAB — BASIC METABOLIC PANEL
Anion gap: 10 (ref 5–15)
BUN: 18 mg/dL (ref 6–20)
CALCIUM: 8.9 mg/dL (ref 8.9–10.3)
CHLORIDE: 101 mmol/L (ref 101–111)
CO2: 25 mmol/L (ref 22–32)
CREATININE: 1.27 mg/dL — AB (ref 0.61–1.24)
GFR calc non Af Amer: >60 mL/min (ref >60)
Glucose, Bld: 117 mg/dL — ABNORMAL HIGH (ref 65–99)
Potassium: 3.7 mmol/L (ref 3.5–5.1)
SODIUM: 136 mmol/L (ref 135–145)

## 2015-07-06 LAB — HEPATITIS PANEL, ACUTE
HEP A IGM: NEGATIVE
HEP B S AG: NEGATIVE
Hep A IgM: NEGATIVE
Hep B C IgM: NEGATIVE
Hep B C IgM: NEGATIVE
Hepatitis B Surface Ag: NEGATIVE

## 2015-07-06 LAB — CARBOXYHEMOGLOBIN
CARBOXYHEMOGLOBIN: 1.2 % (ref 0.5–1.5)
Methemoglobin: 0.8 % (ref 0.0–1.5)
O2 SAT: 64.7 %
Total hemoglobin: 14.8 g/dL (ref 13.5–18.0)

## 2015-07-06 MED ORDER — SODIUM CHLORIDE 0.9% FLUSH: 10.0000 mL | INTRAVENOUS | Status: DC | PRN

## 2015-07-06 MED ORDER — SODIUM CHLORIDE 0.9% FLUSH
10.0000 mL | Freq: Two times a day (BID) | INTRAVENOUS | Status: DC
  Administered 2015-07-06: 20 mL
  Administered 2015-07-06 – 2015-07-08 (×4): 10 mL

## 2015-07-06 NOTE — Progress Notes (Addendum)
Advanced Heart Failure Rounding Note  Referring Physician: Dr. Chinita Pester Primary Physician Minerva Ends, MD Primary Cardiologist: New Reason for Consultation: CHF  Subjective:    Transferred to ICU post cath due to severe hypotension in cath lab. Overall feeling much better. Denies SOB.     Hepatitis panel - negative.   Echo 07/04/15 LVEF 10-15%, normal RV  RHC/LHC  RA = 2 RV = 28/0/4 PA = 28/11 (18) PCW = 5 Fick cardiac output/index = 4.3/2.3 PVR = 3.0 WU FA sat = 94% PA sat = 60%, 63%  Assessment: 1. Normal coronary arteries 2. Low filling pressures with normal cardiac output 3. Severe NICM with EF 10% by echo 3. Development of severe hypotension and shock in cath lab due to vasodilators to treat radial artery spasm    Objective:   Weight Range: 162 lb 0.6 oz (73.5 kg) Body mass index is 24.64 kg/(m^2).   Vital Signs:   Temp:  [97.7 F (36.5 C)-98.3 F (36.8 C)] 98 F (36.7 C) (04/18 0758) Pulse Rate:  [0-109] 83 (04/18 0758) Resp:  [0-29] 16 (04/18 0000) BP: (61-204)/(33-118) 129/77 mmHg (04/18 0758) SpO2:  [0 %-100 %] 96 % (04/18 0758) Weight:  [162 lb 0.6 oz (73.5 kg)] 162 lb 0.6 oz (73.5 kg) (04/18 0600) Last BM Date: 07/05/15  Weight change: Filed Weights   07/04/15 0510 07/05/15 0450 07/06/15 0600  Weight: 160 lb 8 oz (72.802 kg) 161 lb 6.4 oz (73.211 kg) 162 lb 0.6 oz (73.5 kg)    Intake/Output:   Intake/Output Summary (Last 24 hours) at 07/06/15 0814 Last data filed at 07/06/15 0300  Gross per 24 hour  Intake  436.9 ml  Output    725 ml  Net -288.1 ml     Physical Exam: General:  Well appearing. No resp difficulty. In bed.  HEENT: normal Neck: supple. JVP 6-7 cm. Carotids 2+ bilat; no bruits. No lymphadenopathy or thyromegaly appreciated. Cor: PMI nondisplaced. Regular rate & rhythm. No rubs, gallops or murmurs. Lungs: clear Abdomen: soft, NT, ND, no HSM. No bruits or masses. +BS  Extremities: no cyanosis, clubbing,  rash, edema Neuro: alert & orientedx3, cranial nerves grossly intact. moves all 4 extremities w/o difficulty. Affect pleasant  Telemetry: Sinus Tach 100s.    Labs: CBC  Recent Labs  07/05/15 1804  WBC 9.6  HGB 14.7  HCT 43.1  MCV 83.4  PLT 0000000   Basic Metabolic Panel  Recent Labs  07/05/15 0230 07/05/15 1804 07/06/15 0210  NA 139  --  136  K 3.9  --  3.7  CL 103  --  101  CO2 24  --  25  GLUCOSE 112*  --  117*  BUN 15  --  18  CREATININE 1.03 1.49* 1.27*  CALCIUM 9.6  --  8.9   Liver Function Tests No results for input(s): AST, ALT, ALKPHOS, BILITOT, PROT, ALBUMIN in the last 72 hours. No results for input(s): LIPASE, AMYLASE in the last 72 hours. Cardiac Enzymes No results for input(s): CKTOTAL, CKMB, CKMBINDEX, TROPONINI in the last 72 hours.  BNP: BNP (last 3 results)  Recent Labs  07/02/15 1203  BNP 545.5*    ProBNP (last 3 results) No results for input(s): PROBNP in the last 8760 hours.   D-Dimer No results for input(s): DDIMER in the last 72 hours. Hemoglobin A1C No results for input(s): HGBA1C in the last 72 hours. Fasting Lipid Panel No results for input(s): CHOL, HDL, LDLCALC, TRIG, CHOLHDL, LDLDIRECT  in the last 72 hours. Thyroid Function Tests No results for input(s): TSH, T4TOTAL, T3FREE, THYROIDAB in the last 72 hours.  Invalid input(s): FREET3  Other results:     Imaging/Studies:  No results found.  Latest Echo  Latest Cath   Medications:     Scheduled Medications: . aspirin EC  81 mg Oral Daily  . atorvastatin  80 mg Oral q1800  . digoxin  0.25 mg Oral Daily  . enoxaparin (LOVENOX) injection  40 mg Subcutaneous Q24H  . losartan  25 mg Oral Daily  . pantoprazole  40 mg Oral Daily  . sodium chloride flush  3 mL Intravenous Q12H  . sodium chloride flush  3 mL Intravenous Q12H  . sodium chloride flush  3 mL Intravenous Q12H  . spironolactone  12.5 mg Oral Daily    Infusions: . norepinephrine (LEVOPHED) Adult  infusion Stopped (07/06/15 0256)    PRN Medications: sodium chloride, sodium chloride, acetaminophen, acetaminophen, albuterol, diphenhydrAMINE, nitroGLYCERIN, ondansetron (ZOFRAN) IV, sodium chloride flush, sodium chloride flush   Assessment   1. Acute systolic CHF: Echo [id number]  LVEF 10-15%    --due to NICM. Cath 4/17 no CAD.  2. HLD 3. TIA 4. HTN 5. Cardiogenic shock.  Plan  Had RHC with low filling pressures and normal cardiac output. NICM-->  Does have history of HTN and recent viral illness.  LHC cors ok. Clinicalll improved today. Off norepi. SBP ok. No BB for now. Continue spiro, dig, and losartan at current dose. Renal function coming down. Place PICC to check CVP and CO-OX.   Consult cardiac rehab.    Length of Stay: 3   Amy Clegg NP-C  07/06/2015, 8:14 AM  Advanced Heart Failure Team Pager [phone] (M-F; 7a - 4p)  Please contact [facility] for night-coverage after hours (4p -7a ) and weekends on [email or web address]   Patient seen and examined with Darrick Grinder, NP. We discussed all aspects of the encounter. I agree with the assessment and plan as stated above.   Results of cath yesterday reviewed with him. He is much more stable this am. Now off pressors. Creatinine improved. Though still quite tachycardic and likely with limited cardiac reserve. Will move to SDU. PICC placement to better manage CVPs and co-ox. No b-blocker yet. Hold diuretics one more day.    Bensimhon, Daniel,MD 12:24 PM

## 2015-07-06 NOTE — Progress Notes (Signed)
CARDIAC REHAB PHASE I   PRE:  Rate/Rhythm: 94 SR  BP:  Supine:   Sitting: 118/87  Standing:    SaO2: 97%RA  MODE:  Ambulation: 700 ft   POST:  Rate/Rhythm: 99-102 SR-ST  BP:  Supine:   Sitting: 116/87  Standing:    SaO2: 97%RA 1300-1335 Pt walked 700 ft with steady gait. Denied SOB. Tolerated well. Gave pt CHF booklet and reviewed zones. Discussed importance of daily weights, low sodium restriction of 2000 mg and 2L FR. Gave low sodium diets. Encouraged pt to watch CHF video when moved to room with phone.  Wife present for ed too.    Graylon Good, RN BSN  07/06/2015 1:34 PM

## 2015-07-06 NOTE — Progress Notes (Signed)
Peripherally Inserted Central Catheter/Midline Placement  The IV Nurse has discussed with the patient and/or persons authorized to consent for the patient, the purpose of this procedure and the potential benefits and risks involved with this procedure.  The benefits include less needle sticks, lab draws from the catheter and patient may be discharged home with the catheter.  Risks include, but not limited to, infection, bleeding, blood clot (thrombus formation), and puncture of an artery; nerve damage and irregular heat beat.  Alternatives to this procedure were also discussed.  PICC/Midline Placement Documentation        Jerome Barnett 07/06/2015, 11:30 AM Consent obtained by Jule Economy, RN

## 2015-07-06 NOTE — Telephone Encounter (Signed)
Needs TOC pt scheduled fopr 07/20/15 with dr Melburn Hake

## 2015-07-06 NOTE — Progress Notes (Signed)
Subjective: Patient underwent cardiac catheterization yesterday, complicated by an episode of hypotension requiring IVF, epinephrine, and norepinephrine drip.  He then became hypertensive and nauseated.  He was transferred to the ICU, where he remained stable overnight.  This morning, patient denies complaint and states he feels good.  He understands he will need to be compliant with his heart failure medications in order to prevent worsening heart function and death.  Objective: Vital signs in last 24 hours: Filed Vitals:   07/06/15 0300 07/06/15 0400 07/06/15 0500 07/06/15 0600  BP: 105/73 102/77 100/72 104/82  Pulse: 77 79 79 88  Temp:  98.3 F (36.8 C)    TempSrc:  Oral    Resp:      Height:    5\' 8"  (1.727 m)  Weight:    162 lb 0.6 oz (73.5 kg)  SpO2: 91% 94% 94% 94%   Weight change: 10.2 oz (0.289 kg)  Intake/Output Summary (Last 24 hours) at 07/06/15 0756 Last data filed at 07/06/15 0300  Gross per 24 hour  Intake  454.9 ml  Output    725 ml  Net -270.1 ml   Physical Exam  Constitutional: He is oriented to person, place, and time and well-developed, well-nourished, and in no distress. No distress.  HENT:  Head: Normocephalic and atraumatic.  Eyes: EOM are normal. Pupils are equal, round, and reactive to light.  Right eyelid no longer swollen.  Neck: No tracheal deviation present.  Cardiovascular: Normal rate, regular rhythm and normal heart sounds.   Pulmonary/Chest: Effort normal and breath sounds normal. No stridor. No respiratory distress. He has no wheezes.  Minimal crackles in bilateral bases.  Abdominal: Soft. He exhibits no distension. There is no tenderness. There is no rebound and no guarding.  Musculoskeletal: He exhibits no edema.  Neurological: He is alert and oriented to person, place, and time.  Skin: Skin is warm and dry. He is not diaphoretic.    Lab Results: Basic Metabolic Panel:  Recent Labs Lab 07/05/15 0230 07/05/15 1804 07/06/15 0210    NA 139  --  136  K 3.9  --  3.7  CL 103  --  101  CO2 24  --  25  GLUCOSE 112*  --  117*  BUN 15  --  18  CREATININE 1.03 1.49* 1.27*  CALCIUM 9.6  --  8.9   Liver Function Tests:  Recent Labs Lab 07/02/15 1707  AST 25  ALT 38  ALKPHOS 50  BILITOT 1.4*  PROT 6.7  ALBUMIN 4.0   CBC:  Recent Labs Lab 07/02/15 1203 07/05/15 1804  WBC 5.0 9.6  HGB 13.2 14.7  HCT 40.0 43.1  MCV 83.7 83.4  PLT 226 293   Cardiac Enzymes:  Recent Labs Lab 07/02/15 1707 07/02/15 2253 07/03/15 0420  TROPONINI 0.04* 0.05* 0.04*   D-Dimer:  Recent Labs Lab 07/02/15 1343  DDIMER 0.34   Hemoglobin A1C:  Recent Labs Lab 07/02/15 1700  HGBA1C 6.0*   Fasting Lipid Panel:  Recent Labs Lab 07/02/15 1700  CHOL 239*  HDL 43  LDLCALC 172*  TRIG 122  CHOLHDL 5.6   Thyroid Function Tests:  Recent Labs Lab 07/02/15 1700  TSH 0.910   Studies/Results: No results found. Medications: I have reviewed the patient's current medications. Scheduled Meds: . aspirin EC  81 mg Oral Daily  . atorvastatin  80 mg Oral q1800  . digoxin  0.25 mg Oral Daily  . enoxaparin (LOVENOX) injection  40 mg Subcutaneous Q24H  .  losartan  25 mg Oral Daily  . pantoprazole  40 mg Oral Daily  . sodium chloride flush  3 mL Intravenous Q12H  . sodium chloride flush  3 mL Intravenous Q12H  . sodium chloride flush  3 mL Intravenous Q12H  . spironolactone  12.5 mg Oral Daily   Continuous Infusions: . norepinephrine (LEVOPHED) Adult infusion Stopped (07/06/15 0256)   PRN Meds:.sodium chloride, sodium chloride, acetaminophen, acetaminophen, albuterol, diphenhydrAMINE, nitroGLYCERIN, ondansetron (ZOFRAN) IV, sodium chloride flush, sodium chloride flush Assessment/Plan:  Jerome Barnett is a 45 yo man with HTN and GERD, presenting with one week h/o intermittent SOB, chest pressure, and dizziness.  Acute HFrEF, possibly acute viral cardiomyopathy: Patient presented with orthopnea, inspiratory crackles,  elevated BNP, LVH, cardiomegaly, h/o uncontrolled HTN, and recent URI. Echo shows LVH and EF 10-15%.  This is likely worsened by some degree of underlying OSA.He had a mild allergic reaction to lasix (see below), but tolerated torsemide well. Cath reveals normal coronary arteries. Hepatitis and HIV negative.  Heart Failure following and managing, proceeding with PICC to measure co-ox and CVP.  RHC showing normal filling pressures.  Will defer to HF team for restarting of Torsemide. - Teley - ASA - Nitro - Torsemide 10 mg PO daily - Albuterol q6h PRN - Digoxin 0.25 mg - Spironolactone 12.5 mg - Losartan 25 mg daily - Atorvastatin 80 mg - Daily weights. - Strict I/Os  TIA: Patients description of transient arm weakness and numbness/tingling, as well as uncontrolled HTN certainly concerning for TIAs. No focal findings of neuro exam in the ED. His CT and MRI brain were negative for acute stroke. No LOC or bowel/bladder incontinence. No h/o afib, though he has had palpitations in the past. Lipids elevated.  A1c in prediabetes range. His carotid dopplers were negative for any IC stenosis. No evidence of LV thrombus on echo. Jerome Barnett - ASA - Losartan 25 mg daily - Atorvastatin 80 mg daily   Allergy: Patient had mild allergic reaction to Furosemide with right eye swelling and injection without respiratory symptoms.  He reports a similar reaction to Amoxicillin taken a week ago.  There should not be cross-reactivity between these drugs.  However, the pharmacist Dr. Denice Barnett has provided a NEJM paper that demonstrates patients who demonstrate reactions to both sulfa and non-sulfa drugs is not due to a direct cross-reactivity, but a predisposition to multiple allergies. His swelling has subsided and he tolerated Torsemide well.  - Avoid Amoxicillin and Lasix if possible  OSA: Wife reports symptoms of OSA. Patient will need outpatient sleep study.  GERD: Protonix  FEN/GI: HH DVT Ppx: Lovenox  Dispo:  Discharge likely today after echo completed   The patient does have a current PCP (Jerome Nearing, MD) and does need an [facility] hospital follow-up appointment after discharge.  The patient does not have transportation limitations that hinder transportation to clinic appointments.  .Services Needed at time of discharge: Y = Yes, Blank = No PT:   OT:   RN:   Equipment:   Other:     LOS: 3 days   Iline Oven, MD 07/06/2015, 7:56 AM

## 2015-07-07 LAB — BASIC METABOLIC PANEL
Anion gap: 10 (ref 5–15)
BUN: 17 mg/dL (ref 6–20)
CHLORIDE: 104 mmol/L (ref 101–111)
CO2: 24 mmol/L (ref 22–32)
CREATININE: 0.96 mg/dL (ref 0.61–1.24)
Calcium: 9.1 mg/dL (ref 8.9–10.3)
GFR calc Af Amer: >60 mL/min (ref >60)
GFR calc non Af Amer: >60 mL/min (ref >60)
GLUCOSE: 114 mg/dL — AB (ref 65–99)
POTASSIUM: 3.9 mmol/L (ref 3.5–5.1)
Sodium: 138 mmol/L (ref 135–145)

## 2015-07-07 MED ORDER — SACUBITRIL-VALSARTAN 24-26 MG PO TABS
1.0000 | ORAL_TABLET | Freq: Two times a day (BID) | ORAL | Status: DC
  Administered 2015-07-07 – 2015-07-08 (×3): 1 via ORAL
  Filled 2015-07-07 (×3): qty 1

## 2015-07-07 MED ORDER — ASPIRIN EC 81 MG PO TBEC
81.0000 mg | DELAYED_RELEASE_TABLET | Freq: Every day | ORAL | Status: DC
  Filled 2015-07-07: qty 1

## 2015-07-07 MED ORDER — CARVEDILOL 3.125 MG PO TABS
3.1250 mg | ORAL_TABLET | Freq: Two times a day (BID) | ORAL | Status: DC
  Administered 2015-07-07 – 2015-07-08 (×2): 3.125 mg via ORAL
  Filled 2015-07-07 (×2): qty 1

## 2015-07-07 NOTE — Plan of Care (Signed)
Problem: Education: Goal: Knowledge of disease or condition will improve Outcome: Progressing Pt educated on disease process, CHF booklet at bedside. Went over heart healthy foods, daily weights and sodium restriction. CVP 8am 4. Educated on new medication, Entresto and Coreg. Has no further questions at this time. No complaints of pain. Family at bedside.

## 2015-07-07 NOTE — Progress Notes (Signed)
Subjective: Jerome Barnett.  He tolerated PICC placement without complication.  CVPs have been 4-6 with stable Co-ox of ~65%.  He denies CP, SOB, or orthopnea.  Objective: Vital signs in last 24 hours: Filed Vitals:   07/06/15 1552 07/06/15 2014 07/07/15 0007 07/07/15 0445  BP: 113/78 115/72 108/74 126/84  Pulse: 87 93 92 88  Temp: 97.8 F (36.6 C) 98.2 F (36.8 C) 98.5 F (36.9 C) 97.6 F (36.4 C)  TempSrc: Oral Oral Oral Oral  Resp: 18 18 18 18   Height:      Weight:      SpO2: 98% 98% 100% 97%   Weight change:   Intake/Output Summary (Last 24 hours) at 07/07/15 [id number] Last data filed at 07/06/15 2000  Gross per 24 hour  Intake    720 ml  Output      0 ml  Net    720 ml   Physical Exam  Constitutional: He is oriented to person, place, and time and well-developed, well-nourished, and in no distress. No distress.  HENT:  Head: Normocephalic and atraumatic.  Eyes: EOM are normal. Pupils are equal, round, and reactive to light.  Right eyelid no longer swollen.  Neck: No tracheal deviation present.  Cardiovascular: Normal rate, regular rhythm and normal heart sounds.   Pulmonary/Chest: Effort normal and breath sounds normal. No stridor. No respiratory distress. He has no wheezes.  No crackles.  Abdominal: Soft. He exhibits no distension. There is no tenderness. There is no rebound and no guarding.  Musculoskeletal: He exhibits no edema.  Neurological: He is alert and oriented to person, place, and time.  Skin: Skin is warm and dry. He is not diaphoretic.    Lab Results: Basic Metabolic Panel:  Recent Labs Lab 07/06/15 0210 07/07/15 0502  NA 136 138  K 3.7 3.9  CL 101 104  CO2 25 24  GLUCOSE 117* 114*  BUN 18 17  CREATININE 1.27* 0.96  CALCIUM 8.9 9.1   Liver Function Tests:  Recent Labs Lab 07/02/15 1707  AST 25  ALT 38  ALKPHOS 50  BILITOT 1.4*  PROT 6.7  ALBUMIN 4.0   CBC:  Recent Labs Lab 07/02/15 1203 07/05/15 1804  WBC 5.0 9.6  HGB 13.2 14.7    HCT 40.0 43.1  MCV 83.7 83.4  PLT 226 293   Cardiac Enzymes:  Recent Labs Lab 07/02/15 1707 07/02/15 2253 07/03/15 0420  TROPONINI 0.04* 0.05* 0.04*   D-Dimer:  Recent Labs Lab 07/02/15 1343  DDIMER 0.34   Hemoglobin A1C:  Recent Labs Lab 07/02/15 1700  HGBA1C 6.0*   Fasting Lipid Panel:  Recent Labs Lab 07/02/15 1700  CHOL 239*  HDL 43  LDLCALC 172*  TRIG 122  CHOLHDL 5.6   Thyroid Function Tests:  Recent Labs Lab 07/02/15 1700  TSH 0.910   Studies/Results: Dg Chest Port 1 View  07/06/2015  CLINICAL DATA:  Central catheter placement EXAM: PORTABLE CHEST 1 VIEW COMPARISON:  July 02, 2015 FINDINGS: Central catheter tip is in the superior vena cava. No pneumothorax. There is no edema or consolidation. Heart is upper normal in size with pulmonary vascularity within normal limits. No adenopathy. No bone lesions. IMPRESSION: Central catheter tip in superior vena cava. No pneumothorax. Lungs clear. Electronically Signed   By: Lowella Grip III M.D.   On: 07/06/2015 12:50   Medications: I have reviewed the patient's current medications. Scheduled Meds: . aspirin EC  81 mg Oral Daily  . atorvastatin  80 mg Oral q1800  .  digoxin  0.25 mg Oral Daily  . enoxaparin (LOVENOX) injection  40 mg Subcutaneous Q24H  . losartan  25 mg Oral Daily  . pantoprazole  40 mg Oral Daily  . sodium chloride flush  10-40 mL Intracatheter Q12H  . sodium chloride flush  3 mL Intravenous Q12H  . sodium chloride flush  3 mL Intravenous Q12H  . sodium chloride flush  3 mL Intravenous Q12H  . spironolactone  12.5 mg Oral Daily   Continuous Infusions: . norepinephrine (LEVOPHED) Adult infusion Stopped (07/06/15 0256)   PRN Meds:.sodium chloride, sodium chloride, acetaminophen, acetaminophen, albuterol, diphenhydrAMINE, nitroGLYCERIN, ondansetron (ZOFRAN) IV, sodium chloride flush, sodium chloride flush, sodium chloride flush Assessment/Plan:  Mr. Murad is a 45 yo man with  HTN and GERD, presenting with one week h/o intermittent SOB, chest pressure, and dizziness.  Acute HFrEF, possibly acute viral cardiomyopathy: Patient presented with orthopnea, inspiratory crackles, elevated BNP, LVH, cardiomegaly, h/o uncontrolled HTN, and recent URI. Echo shows LVH and EF 10-15%.  This is likely worsened by some degree of underlying OSA.He had a mild allergic reaction to lasix (see below), but tolerated torsemide well. Cath reveals normal coronary arteries. Hepatitis and HIV negative.  CVPs are normal and mixed venous sat is good.  Will defer to HF for initiation of beta blocker and when he is ready for discharge. [name] - ASA - Nitro - Torsemide 10 mg PO daily - Albuterol q6h PRN - Digoxin 0.25 mg - Spironolactone 12.5 mg - Losartan 25 mg daily - Atorvastatin 80 mg - Daily weights - Strict I/Os  TIA: Patients description of transient arm weakness and numbness/tingling, as well as uncontrolled HTN certainly concerning for TIAs. No focal findings of neuro exam in the ED. His CT and MRI brain were negative for acute stroke. No LOC or bowel/bladder incontinence. No h/o afib, though he has had palpitations in the past. Lipids elevated.  A1c in prediabetes range. His carotid dopplers were negative for any IC stenosis. No evidence of LV thrombus on echo. [name] - ASA - Losartan 25 mg daily - Atorvastatin 80 mg daily   Allergy: Patient had mild allergic reaction to Furosemide with right eye swelling and injection without respiratory symptoms.  He reports a similar reaction to Amoxicillin taken a week ago.  There should not be cross-reactivity between these drugs.  However, the pharmacist Dr. Denice Paradise has provided a NEJM paper that demonstrates patients who demonstrate reactions to both sulfa and non-sulfa drugs is not due to a direct cross-reactivity, but a predisposition to multiple allergies. His swelling has subsided and he tolerated Torsemide well.  - Avoid Amoxicillin and  Lasix if possible  OSA: Wife reports symptoms of OSA. Patient will need outpatient sleep study.  GERD: Protonix  FEN/GI: HH  DVT Ppx: Lovenox   Dispo: Discharge likely today after echo completed   The patient does have a current PCP (No primary care provider on file.) and does need an [facility] hospital follow-up appointment after discharge.  The patient does not have transportation limitations that hinder transportation to clinic appointments.  .Services Needed at time of discharge: Y = Yes, Blank = No PT:   OT:   RN:   Equipment:   Other:     LOS: 4 days   Iline Oven, MD 07/07/2015, 7:22 AM

## 2015-07-07 NOTE — Progress Notes (Addendum)
Advanced Heart Failure Rounding Note  Referring Physician: Dr. Chinita Pester Primary Physician Minerva Ends, MD Primary Cardiologist: New Reason for Consultation: CHF  Subjective:    Overall feeling much better. Denies SOB. Ambulating halls. CVP 4. Weight stable. Co-ox 65%.   HR in 90s. 120 with walking halls.     Echo 07/04/15 LVEF 10-15%, normal RV  RHC/LHC  RA = 2 RV = 28/0/4 PA = 28/11 (18) PCW = 5 Fick cardiac output/index = 4.3/2.3 PVR = 3.0 WU FA sat = 94% PA sat = 60%, 63%  Assessment: 1. Normal coronary arteries 2. Low filling pressures with normal cardiac output 3. Severe NICM with EF 10% by echo 3. Development of severe hypotension and shock in cath lab due to vasodilators to treat radial artery spasm    Objective:   Weight Range: 73.5 kg (162 lb 0.6 oz) Body mass index is 24.64 kg/(m^2).   Vital Signs:   Temp:  [97.6 F (36.4 C)-98.5 F (36.9 C)] 98 F (36.7 C) (04/19 0835) Pulse Rate:  [85-93] 90 (04/19 0835) Resp:  [17-18] 17 (04/19 0835) BP: (101-126)/(68-87) 122/85 mmHg (04/19 0835) SpO2:  [95 %-100 %] 98 % (04/19 0835) Last BM Date: 07/05/15  Weight change: Filed Weights   07/04/15 0510 07/05/15 0450 07/06/15 0600  Weight: 72.802 kg (160 lb 8 oz) 73.211 kg (161 lb 6.4 oz) 73.5 kg (162 lb 0.6 oz)    Intake/Output:   Intake/Output Summary (Last 24 hours) at 07/07/15 1055 Last data filed at 07/07/15 1000  Gross per 24 hour  Intake    720 ml  Output      0 ml  Net    720 ml     Physical Exam: General:  Well appearing. No resp difficulty. In bed.  HEENT: normal Neck: supple. JVP flat  Carotids 2+ bilat; no bruits. No lymphadenopathy or thyromegaly appreciated. Cor: PMI nondisplaced. Regular rate & rhythm. No rubs, gallops or murmurs. Lungs: clear Abdomen: soft, NT, ND, no HSM. No bruits or masses. +BS  Extremities: no cyanosis, clubbing, rash, edema Neuro: alert & orientedx3, cranial nerves grossly intact. moves all 4  extremities w/o difficulty. Affect pleasant  Telemetry: Sinus Tach 90s (120s with exertion) - reviewed personally    Labs: CBC  Recent Labs  07/05/15 1804  WBC 9.6  HGB 14.7  HCT 43.1  MCV 83.4  PLT 0000000   Basic Metabolic Panel  Recent Labs  07/06/15 0210 07/07/15 0502  NA 136 138  K 3.7 3.9  CL 101 104  CO2 25 24  GLUCOSE 117* 114*  BUN 18 17  CREATININE 1.27* 0.96  CALCIUM 8.9 9.1   Liver Function Tests No results for input(s): AST, ALT, ALKPHOS, BILITOT, PROT, ALBUMIN in the last 72 hours. No results for input(s): LIPASE, AMYLASE in the last 72 hours. Cardiac Enzymes No results for input(s): CKTOTAL, CKMB, CKMBINDEX, TROPONINI in the last 72 hours.  BNP: BNP (last 3 results)  Recent Labs  07/02/15 1203  BNP 545.5*    ProBNP (last 3 results) No results for input(s): PROBNP in the last 8760 hours.   D-Dimer No results for input(s): DDIMER in the last 72 hours. Hemoglobin A1C No results for input(s): HGBA1C in the last 72 hours. Fasting Lipid Panel No results for input(s): CHOL, HDL, LDLCALC, TRIG, CHOLHDL, LDLDIRECT in the last 72 hours. Thyroid Function Tests No results for input(s): TSH, T4TOTAL, T3FREE, THYROIDAB in the last 72 hours.  Invalid input(s): FREET3  Other results:  Imaging/Studies:  Dg Chest Port 1 View  07/06/2015  CLINICAL DATA:  Central catheter placement EXAM: PORTABLE CHEST 1 VIEW COMPARISON:  July 02, 2015 FINDINGS: Central catheter tip is in the superior vena cava. No pneumothorax. There is no edema or consolidation. Heart is upper normal in size with pulmonary vascularity within normal limits. No adenopathy. No bone lesions. IMPRESSION: Central catheter tip in superior vena cava. No pneumothorax. Lungs clear. Electronically Signed   By: Lowella Grip III M.D.   On: 07/06/2015 12:50    Latest Echo  Latest Cath   Medications:     Scheduled Medications: . aspirin EC  81 mg Oral Daily  . atorvastatin  80 mg  Oral q1800  . digoxin  0.25 mg Oral Daily  . enoxaparin (LOVENOX) injection  40 mg Subcutaneous Q24H  . losartan  25 mg Oral Daily  . pantoprazole  40 mg Oral Daily  . sodium chloride flush  10-40 mL Intracatheter Q12H  . sodium chloride flush  3 mL Intravenous Q12H  . sodium chloride flush  3 mL Intravenous Q12H  . sodium chloride flush  3 mL Intravenous Q12H  . spironolactone  12.5 mg Oral Daily    Infusions: . norepinephrine (LEVOPHED) Adult infusion Stopped (07/06/15 0256)    PRN Medications: sodium chloride, sodium chloride, acetaminophen, acetaminophen, albuterol, diphenhydrAMINE, nitroGLYCERIN, ondansetron (ZOFRAN) IV, sodium chloride flush, sodium chloride flush, sodium chloride flush   Assessment   1. Acute systolic CHF: Echo [id number]  LVEF 10-15%    --due to NICM. Cath 4/17 no CAD.  2. HLD 3. TIA 4. HTN 5. Cardiogenic shock.  Plan   Continues to improve. Co-ox and CVP ok. SBP 120s. HR still fast with significant exertional tachycardia suggestive of limited contractile reserve.   Switch losartan to Entresto. Start low dose b-blocker tonight. No torsemide now. Hopefully Entresto and spiro will be sufficient diuretic. Can stop ASA.  Needs statin for HL. Discussed fluid restriction and activity guidelines with him and family. Possible home tomorrow.   CR to see.   Refuses cMRI due to claustrophobia at this point.    Jalan Fariss,MD 10:55 AM

## 2015-07-07 NOTE — Progress Notes (Signed)
Heart Failure Navigator Consult Note  Presentation: Jerome Barnett is a 45 yo male with HTN and GERD, presenting with one week h/o intermittent SOB, chest pressure, and dizziness. He reports intermittent SOB and dizziness over the last week, sometimes a/w chest pressure. He reports he is SOB at rest, worse with lying flat and decreased with lying prone or reclined. He denies leg swelling. He can only climb one flight of stairs before becoming SOB. He sleeps on 2 pillows. His chest pressure is also worst at night, and is a/w wheezing. He denies palpitations. He has a h/o HTN, previously controlled, though he has not taken his Amlodipine in ~6 months. He has a 40 pack-year smoking history (2ppd x 20 years), but quit 2 years ago. He denies a h/o CHF, HLD, DMII, MI, or stroke.   Today he also complains of small stabbing pains to his sternum a/w worsened SOB and diaphoresis. He denies associated nausea. The pain last a few minutes, but recurs. It is a similar pain to an ED presentation one year ago.  History reviewed. No pertinent past medical history.  Social History   Social History  . Marital Status: Married    Spouse Name: N/A  . Number of Children: N/A  . Years of Education: N/A   Social History Main Topics  . Smoking status: Former Smoker -- 2.00 packs/day for 20 years    Quit date: 03/20/2012  . Smokeless tobacco: Never Used  . Alcohol Use: No  . Drug Use: No  . Sexual Activity: Not Asked   Other Topics Concern  . None   Social History Narrative   Patient is married, no children.   Has pets   Works full time at [employer]     ECHO:Study Conclusions  - Left ventricle: The cavity size was moderately dilated. Wall  thickness was increased in a pattern of mild LVH. Left  ventricular geometry showed evidence of eccentric hypertrophy.  Systolic function was severely reduced. The estimated ejection  fraction was in the range of 10% to 15%. Severe  diffuse  hypokinesis with no identifiable regional variations. No evidence  of thrombus. - Aortic valve: There was mild regurgitation. - Left atrium: The atrium was moderately dilated.  Transthoracic echocardiography. M-mode, complete 2D, spectral Doppler, and color Doppler. Birthdate: Patient birthdate: 1970/04/04. Age: Patient is 45 yr old. Sex: Gender: male. BMI: 24.4 kg/m^2. Blood pressure: 118/94 Patient status: Inpatient. Study date: Study date: 07/04/2015. Study time: 11:38 AM. Location: Bedside.  BNP    Component Value Date/Time   BNP 545.5* 07/02/2015 1203    ProBNP No results found for: PROBNP   Education Assessment and Provision:  Detailed education and instructions provided on heart failure disease management including the following:  Signs and symptoms of Heart Failure When to call the physician Importance of daily weights Low sodium diet Fluid restriction Medication management Anticipated future follow-up appointments  Patient education given on each of the above topics.  Patient acknowledges understanding and acceptance of all instructions.  I spoke with Jerome Barnett regarding his newly diagnosed HF.  He admits that he has hypertension and quit taking medications after initial prescription " a year ago".  We discussed the importance of daily weights and how weight increases relate to signs and symptoms of HF as well as when to contact the physician.  I reviewed a low sodium diet and high sodium foods to avoid.    I reviewed the importance of taking prescribed medication to his treatment plan --  he admits that he may have issues affording medications with no insurance.  He currently works at a [employer], academic.  He is married and lives with his wife here in [place].  He will follow-up at the [facility].  Education Materials:  "Living Better With Heart Failure" Booklet, Daily Weight Tracker Tool    High Risk Criteria for  Readmission and/or Poor Patient Outcomes:   EF <30%- Yes 10-15%  2 or more admissions in 6 months- No  Difficult social situation- Yes -currently no insurance   Demonstrates medication noncompliance- Yes- has in the past    Barriers of Care:  New HF, Knowledge and compliance  Discharge Planning:   Plans to discharge to home with his wife to [place].  I have updated Care Manager of patient's insurance status and likely new medication prescriptions.  We will collaborate to formulate a plan to cover medications upon discharge.

## 2015-07-07 NOTE — Progress Notes (Signed)
CARDIAC REHAB PHASE I   PRE:  Rate/Rhythm: 89 SR  BP:  Supine:   Sitting: 122/86  Standing:    SaO2: 98%RA  MODE:  Ambulation: 750 ft   POST:  Rate/Rhythm: 102 ST  BP:  Supine:   Sitting: 126/90  Standing:    SaO2: 98%RA 0950-1010 Pt walked 750 ft with steady gait. No DOE noted. Tolerated well. Wrote down how to view CHF video and encouraged pt to view. No questions re ed yesterday.Graylon Good, RN BSN  07/07/2015 10:07 AM

## 2015-07-08 LAB — BASIC METABOLIC PANEL
Anion gap: 11 (ref 5–15)
BUN: 13 mg/dL (ref 6–20)
CALCIUM: 9.4 mg/dL (ref 8.9–10.3)
CHLORIDE: 102 mmol/L (ref 101–111)
CO2: 24 mmol/L (ref 22–32)
CREATININE: 0.81 mg/dL (ref 0.61–1.24)
GFR calc Af Amer: >60 mL/min (ref >60)
GFR calc non Af Amer: >60 mL/min (ref >60)
GLUCOSE: 118 mg/dL — AB (ref 65–99)
Potassium: 4.1 mmol/L (ref 3.5–5.1)
Sodium: 137 mmol/L (ref 135–145)

## 2015-07-08 LAB — DIGOXIN LEVEL: Digoxin Level: 0.5 ng/mL — ABNORMAL LOW (ref 0.8–2.0)

## 2015-07-08 MED ORDER — BISACODYL 5 MG PO TBEC
10.0000 mg | DELAYED_RELEASE_TABLET | Freq: Once | ORAL | Status: AC
  Administered 2015-07-08: 10 mg via ORAL
  Filled 2015-07-08: qty 2

## 2015-07-08 MED ORDER — POLYETHYLENE GLYCOL 3350 17 G PO PACK
17.0000 g | PACK | Freq: Every day | ORAL | Status: DC
  Administered 2015-07-08: 17 g via ORAL
  Filled 2015-07-08: qty 1

## 2015-07-08 MED ORDER — ASPIRIN 81 MG PO CHEW
81.0000 mg | CHEWABLE_TABLET | Freq: Every day | ORAL | Status: DC
  Administered 2015-07-08: 81 mg via ORAL
  Filled 2015-07-08: qty 1

## 2015-07-08 MED ORDER — ATORVASTATIN CALCIUM 80 MG PO TABS: 80.0000 mg | ORAL_TABLET | Freq: Every day | ORAL | Status: DC

## 2015-07-08 MED ORDER — SPIRONOLACTONE 25 MG PO TABS: 12.5000 mg | ORAL_TABLET | Freq: Every day | ORAL | Status: DC

## 2015-07-08 MED ORDER — ASPIRIN 81 MG PO CHEW: 81.0000 mg | CHEWABLE_TABLET | Freq: Every day | ORAL | Status: DC

## 2015-07-08 MED ORDER — SACUBITRIL-VALSARTAN 24-26 MG PO TABS: 1.0000 | ORAL_TABLET | Freq: Two times a day (BID) | ORAL | Status: DC

## 2015-07-08 MED ORDER — DIGOXIN 250 MCG PO TABS: 0.2500 mg | ORAL_TABLET | Freq: Every day | ORAL | Status: DC

## 2015-07-08 MED ORDER — SENNOSIDES-DOCUSATE SODIUM 8.6-50 MG PO TABS
2.0000 | ORAL_TABLET | Freq: Once | ORAL | Status: AC
  Administered 2015-07-08: 2 via ORAL
  Filled 2015-07-08: qty 2

## 2015-07-08 MED ORDER — POLYETHYLENE GLYCOL 3350 17 G PO PACK
17.0000 g | PACK | Freq: Every day | ORAL | Status: DC
Start: 2015-07-08 — End: 2015-07-12

## 2015-07-08 MED ORDER — CARVEDILOL 3.125 MG PO TABS: 3.1250 mg | ORAL_TABLET | Freq: Two times a day (BID) | ORAL | Status: DC

## 2015-07-08 MED FILL — ATORVASTATIN 80 MG TABLET: 80 | 34 days supply | Qty: 34 | Fill #0

## 2015-07-08 MED FILL — POLYETHYLENE GLYCOL 3350: 15 days supply | Qty: 255 | Fill #0

## 2015-07-08 MED FILL — CARVEDILOL 3.125 MG TABLET: 3.125 | 34 days supply | Qty: 68 | Fill #0

## 2015-07-08 MED FILL — ENTRESTO 24 MG-26 MG TABLET: 24-26 | 30 days supply | Qty: 60 | Fill #0

## 2015-07-08 MED FILL — SPIRONOLACTONE 25 MG TABLET: 25 | 34 days supply | Qty: 17 | Fill #0

## 2015-07-08 NOTE — Progress Notes (Signed)
CARDIAC REHAB PHASE I   PRE:  Rate/Rhythm: 96 SR    BP: sitting 92/58    SaO2: 100 RA  MODE:  Ambulation: 1100 ft   POST:  Rate/Rhythm: 115 ST    BP: sitting 93/63     SaO2:   Tolerated very well, no c/o. HR up to 115 ST, asx. BP low/stable. Discussed ex gl with pt and wife. He is not a candidate for CRPII right now but would be in future if EF stays low. He is asking about mountain biking. Encouraged walking for now and to discuss with MD in the future. [id number]   [place]ill, ACSM 07/08/2015 10:53 AM

## 2015-07-08 NOTE — Progress Notes (Signed)
Subjective: NAEON.  He denies CP, SOB, or orthopnea.  He has noticed any worsening symptoms since starting Coreg.  Objective: Vital signs in last 24 hours: Filed Vitals:   07/07/15 1950 07/08/15 0100 07/08/15 0520 07/08/15 0803  BP: 123/82 123/86 125/85 116/90  Pulse: 82 83 84   Temp: 98 F (36.7 C) 97.7 F (36.5 C) 97.7 F (36.5 C) 97.8 F (36.6 C)  TempSrc: Oral Oral Oral Oral  Resp: 18  16   Height:      Weight:   160 lb 14.4 oz (72.984 kg)   SpO2: 99% 98% 97% 100%   Weight change:   Intake/Output Summary (Last 24 hours) at 07/08/15 0954 Last data filed at 07/08/15 0900  Gross per 24 hour  Intake   1200 ml  Output   1950 ml  Net   -750 ml   Physical Exam  Constitutional: He is oriented to person, place, and time and well-developed, well-nourished, and in no distress. No distress.  HENT:  Head: Normocephalic and atraumatic.  Eyes: EOM are normal. Pupils are equal, round, and reactive to light.  Right eyelid no longer swollen.  Neck: No tracheal deviation present.  Cardiovascular: Normal rate, regular rhythm and normal heart sounds.   Pulmonary/Chest: Effort normal and breath sounds normal. No stridor. No respiratory distress. He has no wheezes.  No crackles.  Abdominal: Soft. He exhibits no distension. There is no tenderness. There is no rebound and no guarding.  Musculoskeletal: He exhibits no edema.  Neurological: He is alert and oriented to person, place, and time.  Skin: Skin is warm and dry. He is not diaphoretic.    Lab Results: Basic Metabolic Panel:  Recent Labs Lab 07/07/15 0502 07/08/15 0520  NA 138 137  K 3.9 4.1  CL 104 102  CO2 24 24  GLUCOSE 114* 118*  BUN 17 13  CREATININE 0.96 0.81  CALCIUM 9.1 9.4   Liver Function Tests:  Recent Labs Lab 07/02/15 1707  AST 25  ALT 38  ALKPHOS 50  BILITOT 1.4*  PROT 6.7  ALBUMIN 4.0   CBC:  Recent Labs Lab 07/02/15 1203 07/05/15 1804  WBC 5.0 9.6  HGB 13.2 14.7  HCT 40.0 43.1  MCV  83.7 83.4  PLT 226 293   Cardiac Enzymes:  Recent Labs Lab 07/02/15 1707 07/02/15 2253 07/03/15 0420  TROPONINI 0.04* 0.05* 0.04*   D-Dimer:  Recent Labs Lab 07/02/15 1343  DDIMER 0.34   Hemoglobin A1C:  Recent Labs Lab 07/02/15 1700  HGBA1C 6.0*   Fasting Lipid Panel:  Recent Labs Lab 07/02/15 1700  CHOL 239*  HDL 43  LDLCALC 172*  TRIG 122  CHOLHDL 5.6   Thyroid Function Tests:  Recent Labs Lab 07/02/15 1700  TSH 0.910   Studies/Results: Dg Chest Port 1 View  07/06/2015  CLINICAL DATA:  Central catheter placement EXAM: PORTABLE CHEST 1 VIEW COMPARISON:  July 02, 2015 FINDINGS: Central catheter tip is in the superior vena cava. No pneumothorax. There is no edema or consolidation. Heart is upper normal in size with pulmonary vascularity within normal limits. No adenopathy. No bone lesions. IMPRESSION: Central catheter tip in superior vena cava. No pneumothorax. Lungs clear. Electronically Signed   By: Lowella Grip III M.D.   On: 07/06/2015 12:50   Medications: I have reviewed the patient's current medications. Scheduled Meds: . aspirin  81 mg Oral Daily  . atorvastatin  80 mg Oral q1800  . carvedilol  3.125 mg Oral BID WC  .  digoxin  0.25 mg Oral Daily  . enoxaparin (LOVENOX) injection  40 mg Subcutaneous Q24H  . pantoprazole  40 mg Oral Daily  . polyethylene glycol  17 g Oral Daily  . sacubitril-valsartan  1 tablet Oral BID  . sodium chloride flush  10-40 mL Intracatheter Q12H  . sodium chloride flush  3 mL Intravenous Q12H  . sodium chloride flush  3 mL Intravenous Q12H  . sodium chloride flush  3 mL Intravenous Q12H  . spironolactone  12.5 mg Oral Daily   Continuous Infusions:   PRN Meds:.sodium chloride, sodium chloride, acetaminophen, acetaminophen, albuterol, diphenhydrAMINE, sodium chloride flush, sodium chloride flush, sodium chloride flush Assessment/Plan:  Mr. Nakashima is a 45 yo man with HTN and GERD, presenting with one week h/o  intermittent SOB, chest pressure, and dizziness.  Acute HFrEF, possibly acute viral cardiomyopathy: Patient presented with orthopnea, inspiratory crackles, elevated BNP, LVH, cardiomegaly, h/o uncontrolled HTN, and recent URI. Echo shows LVH and EF 10-15%.  This is likely worsened by some degree of underlying OSA.He had a mild allergic reaction to lasix (see below), but tolerated torsemide well. Cath reveals normal coronary arteries. Hepatitis and HIV negative.  CVPs are normal and mixed venous sat is good.  He has tolerated full medical therapy and medications supplied through Heart Failure. [name] - ASA - Nitro - Torsemide 10 mg PO daily, holding on discharge - Albuterol q6h PRN - Digoxin 0.25 mg - Spironolactone 12.5 mg - Entresto 24-26 mg BID - Atorvastatin 80 mg - Carvedilol 3.125 mg BID - Daily weights - Strict I/Os  TIA: Patients description of transient arm weakness and numbness/tingling, as well as uncontrolled HTN certainly concerning for TIAs. No focal findings of neuro exam in the ED. His CT and MRI brain were negative for acute stroke. No LOC or bowel/bladder incontinence. No h/o afib, though he has had palpitations in the past. Lipids elevated.  A1c in prediabetes range. His carotid dopplers were negative for any IC stenosis. No evidence of LV thrombus on echo. [name] - ASA - Losartan 25 mg daily - Atorvastatin 80 mg daily   Allergy: Patient had mild allergic reaction to Furosemide with right eye swelling and injection without respiratory symptoms.  He reports a similar reaction to Amoxicillin taken a week ago.  There should not be cross-reactivity between these drugs.  However, the pharmacist Dr. Denice Paradise has provided a NEJM paper that demonstrates patients who demonstrate reactions to both sulfa and non-sulfa drugs is not due to a direct cross-reactivity, but a predisposition to multiple allergies. His swelling has subsided and he tolerated Torsemide well.  - Avoid  Amoxicillin and Lasix if possible  OSA: Wife reports symptoms of OSA. Patient will need outpatient sleep study.  GERD: Protonix  FEN/GI: HH  DVT Ppx: Lovenox   Dispo: Discharge likely today after echo completed   The patient does have a current PCP (No primary care provider on file.) and does need an [facility] hospital follow-up appointment after discharge.  The patient does not have transportation limitations that hinder transportation to clinic appointments.  .Services Needed at time of discharge: Y = Yes, Blank = No PT:   OT:   RN:   Equipment:   Other:     LOS: 5 days   Iline Oven, MD 07/08/2015, 9:54 AM

## 2015-07-08 NOTE — Progress Notes (Signed)
Advanced Heart Failure Rounding Note  Referring Physician: Dr. Chinita Pester Primary Physician Minerva Ends, MD Primary Cardiologist: New Reason for Consultation: CHF  Subjective:    Continues to feel better Denies SOB or CP. No lightheadedness or dizziness. Ambulating halls, HR still going up with activity. HR in 80-90s at rest. Walked 750 ft with CR without difficulty. CVP 3-4. Weight down 2 lbs over past two days.    Echo 07/04/15 LVEF 10-15%, normal RV  RHC/LHC  RA = 2 RV = 28/0/4 PA = 28/11 (18) PCW = 5 Fick cardiac output/index = 4.3/2.3 PVR = 3.0 WU FA sat = 94% PA sat = 60%, 63%  Assessment: 1. Normal coronary arteries 2. Low filling pressures with normal cardiac output 3. Severe NICM with EF 10% by echo 3. Development of severe hypotension and shock in cath lab due to vasodilators to treat radial artery spasm    Objective:   Weight Range: 160 lb 14.4 oz (72.984 kg) Body mass index is 24.47 kg/(m^2).   Vital Signs:   Temp:  [97.7 F (36.5 C)-98.3 F (36.8 C)] 97.7 F (36.5 C) (04/20 0520) Pulse Rate:  [82-90] 84 (04/20 0520) Resp:  [16-18] 16 (04/20 0520) BP: (122-126)/(82-88) 125/85 mmHg (04/20 0520) SpO2:  [96 %-99 %] 97 % (04/20 0520) Weight:  [160 lb 14.4 oz (72.984 kg)] 160 lb 14.4 oz (72.984 kg) (04/20 0520) Last BM Date: 07/07/15  Weight change: Filed Weights   07/05/15 0450 07/06/15 0600 07/08/15 0520  Weight: 161 lb 6.4 oz (73.211 kg) 162 lb 0.6 oz (73.5 kg) 160 lb 14.4 oz (72.984 kg)    Intake/Output:   Intake/Output Summary (Last 24 hours) at 07/08/15 0735 Last data filed at 07/08/15 0500  Gross per 24 hour  Intake    960 ml  Output   1950 ml  Net   -990 ml     Physical Exam: General:  Well appearing. No resp difficulty. In bed.  HEENT: normal Neck: supple. JVP flat  Carotids 2+ bilat; no bruits. No thyromegaly or nodule noted Cor: PMI nondisplaced. RRR. No M/G/R. Lungs: CTAB, normal effort Abdomen: soft, NT, ND, no  HSM. No bruits or masses. +BS  Extremities: no cyanosis, clubbing, rash, edema Neuro: alert & orientedx3, cranial nerves grossly intact. moves all 4 extremities w/o difficulty. Affect pleasant  Telemetry: Reviewed personally, NSR 80-90s  Up into Sinus Tach in 120s with exertion     Labs: CBC  Recent Labs  07/05/15 1804  WBC 9.6  HGB 14.7  HCT 43.1  MCV 83.4  PLT 350   Basic Metabolic Panel  Recent Labs  07/07/15 0502 07/08/15 0520  NA 138 137  K 3.9 4.1  CL 104 102  CO2 24 24  GLUCOSE 114* 118*  BUN 17 13  CREATININE 0.96 0.81  CALCIUM 9.1 9.4   Liver Function Tests No results for input(s): AST, ALT, ALKPHOS, BILITOT, PROT, ALBUMIN in the last 72 hours. No results for input(s): LIPASE, AMYLASE in the last 72 hours. Cardiac Enzymes No results for input(s): CKTOTAL, CKMB, CKMBINDEX, TROPONINI in the last 72 hours.  BNP: BNP (last 3 results)  Recent Labs  07/02/15 1203  BNP 545.5*    ProBNP (last 3 results) No results for input(s): PROBNP in the last 8760 hours.   D-Dimer No results for input(s): DDIMER in the last 72 hours. Hemoglobin A1C No results for input(s): HGBA1C in the last 72 hours. Fasting Lipid Panel No results for input(s): CHOL, HDL, LDLCALC,  TRIG, CHOLHDL, LDLDIRECT in the last 72 hours. Thyroid Function Tests No results for input(s): TSH, T4TOTAL, T3FREE, THYROIDAB in the last 72 hours.  Invalid input(s): FREET3  Other results:     Imaging/Studies:  Dg Chest Port 1 View  07/06/2015  CLINICAL DATA:  Central catheter placement EXAM: PORTABLE CHEST 1 VIEW COMPARISON:  July 02, 2015 FINDINGS: Central catheter tip is in the superior vena cava. No pneumothorax. There is no edema or consolidation. Heart is upper normal in size with pulmonary vascularity within normal limits. No adenopathy. No bone lesions. IMPRESSION: Central catheter tip in superior vena cava. No pneumothorax. Lungs clear. Electronically Signed   By: Lowella Grip  III M.D.   On: 07/06/2015 12:50    Latest Echo  Latest Cath   Medications:     Scheduled Medications: . aspirin EC  81 mg Oral Daily  . atorvastatin  80 mg Oral q1800  . carvedilol  3.125 mg Oral BID WC  . digoxin  0.25 mg Oral Daily  . enoxaparin (LOVENOX) injection  40 mg Subcutaneous Q24H  . pantoprazole  40 mg Oral Daily  . sacubitril-valsartan  1 tablet Oral BID  . sodium chloride flush  10-40 mL Intracatheter Q12H  . sodium chloride flush  3 mL Intravenous Q12H  . sodium chloride flush  3 mL Intravenous Q12H  . sodium chloride flush  3 mL Intravenous Q12H  . spironolactone  12.5 mg Oral Daily    Infusions:    PRN Medications: sodium chloride, sodium chloride, acetaminophen, acetaminophen, albuterol, diphenhydrAMINE, sodium chloride flush, sodium chloride flush, sodium chloride flush   Assessment   1. Acute systolic CHF: Echo 2/99/37  LVEF 10-15%    --due to NICM. Cath 4/17 no CAD.  2. HLD 3. TIA 4. HTN 5. Cardiogenic shock.  Plan   CVP 3-4. Continues to improve.  SBP 120s. HR still fast with significant exertional tachycardia suggestive of limited contractile reserve.   Losartan switched to [facility] 07/07/15. Coreg 3.125 mg BID started evening of 07/07/15. No diuretics for now.   CVP low normal.  Instructed to let us know about any dizziness or lightheadedness so we could adjust his medications.   Continue digoxin. Check level at follow up next week.   Remove PICC line.   Will continue ASA 81 mg with hx of TIA.   Continue atorvastatin for HL.   Discussed fluid restriction and activity guidelines with him and family. Met with HF Nurse Navigator yesterday.   Did well working with CR.   Refused cMRI due to claustrophobia  OK for home today.  Has follow up with HF clinic in one week.   HF meds for home. ASA 81 mg daily Atorvastatin 80 mg daily Coreg 3.125 mg BID Digoxin 0.25 mg  Entresto 24-26 mg BID Spironolactone 12.5 mg daily  Shirley Friar, [place] 7:35 AM  Advanced Heart Failure Team Pager [phone] (M-F; 7a - 4p)  Please contact [facility] for night-coverage after hours (4p -7a ) and weekends on [email or web address]    Patient seen and examined with Oda Kilts, PA-C. We discussed all aspects of the encounter. I agree with the assessment and plan as stated above.   He is ready for d/c. Will follow closely in HF Clinic. Agree with meds as above. He knows to call the HF  Clinic immediately if he is feeling worse.   Bensimhon, Daniel,MD 12:58 PM

## 2015-07-08 NOTE — Care Management Note (Signed)
Case Management Note  Patient Details  Name: Jerome Barnett MRN: [id number] Date of Birth: 1971/02/06  Subjective/Objective:   Pt admitted for SOB and chest pain. Plan will be for d/chome today with family support. Pt is without PCP and Insurance. HF Clinic will follow th patient and assist with medications.  HF Navigator to provide pt with Map to Pharmacy and Clinic.                 Action/Plan: CM did provide pt with 30 day free card for [facility]. CM did call the [facility] and medication is available. Pt is aware. No further needs from CM at this time.    Expected Discharge Date:                  Expected Discharge Plan:  Home/Self Care  In-House Referral:  NA  Discharge planning Services  CM Consult, Medication Assistance  Post Acute Care Choice:  NA Choice offered to:  NA  DME Arranged:  N/A DME Agency:  NA  HH Arranged:  NA HH Agency:  NA  Status of Service:  Completed, signed off  Medicare Important Message Given:    Date Medicare IM Given:    Medicare IM give by:    Date Additional Medicare IM Given:    Additional Medicare Important Message give by:     If discussed at [place] of Stay Meetings, dates discussed:    Additional Comments:  Bethena Roys, RN 07/08/2015, 9:55 AM

## 2015-07-08 NOTE — Progress Notes (Signed)
I have sent Heart Failure prescriptions to the [facility] outpatient pharmacy to be provided through the [facility].  I will make sure that patient can pick up medications and if he is unable I will deliver them to his bedside.

## 2015-07-08 NOTE — Discharge Instructions (Signed)
1. Take Aspirin 81 mg daily. 2. Take Atorvastatin 80 mg daily. 3. Take Carvedilol 3.125 mg TWICE daily. 4. Take Entresto 24-26 mg TWICE daily. 5. Take Digoxin 0.25 mg daily. Follow up with Heart Failure to have Digoxin level checked. 6. Take Spironolactone 12.5 mg daily. 7. Weight yourself every morning after urinating. If weight increases > 3-5 lbs from baseline, call the Heart Failure clinic or the Internal Medicine clinic.

## 2015-07-08 NOTE — Progress Notes (Signed)
Discharge teaching and instructions reviewed. Pt has no further questions at this time. VSS. Instructed to not go back to work until followup visit. entresto card given. Belongings with pt. Discharging home via wife.

## 2015-07-12 ENCOUNTER — Encounter (HOSPITAL_COMMUNITY): Payer: Self-pay | Admitting: *Deleted

## 2015-07-12 ENCOUNTER — Inpatient Hospital Stay (HOSPITAL_COMMUNITY)
Admission: AD | Admit: 2015-07-12 | Discharge: 2015-07-14 | DRG: 293 | Disposition: A | Discharge status: Home Health | Payer: Self-pay | Source: Ambulatory Visit | Attending: Cardiology | Admitting: Cardiology

## 2015-07-12 ENCOUNTER — Ambulatory Visit (HOSPITAL_COMMUNITY)
Admission: RE | Admit: 2015-07-12 | Discharge: 2015-07-12 | Disposition: A | Discharge status: Home / Self Care | Payer: Self-pay | Source: Ambulatory Visit | Attending: Cardiology | Admitting: Cardiology

## 2015-07-12 ENCOUNTER — Telehealth (HOSPITAL_COMMUNITY): Payer: Self-pay | Admitting: Surgery

## 2015-07-12 VITALS — HR 75

## 2015-07-12 DIAGNOSIS — I110 Hypertensive heart disease with heart failure: Principal | ICD-10-CM | POA: Diagnosis present

## 2015-07-12 DIAGNOSIS — Z87891 Personal history of nicotine dependence: Secondary | ICD-10-CM

## 2015-07-12 DIAGNOSIS — I951 Orthostatic hypotension: Secondary | ICD-10-CM

## 2015-07-12 DIAGNOSIS — I5022 Chronic systolic (congestive) heart failure: Secondary | ICD-10-CM

## 2015-07-12 DIAGNOSIS — I5023 Acute on chronic systolic (congestive) heart failure: Secondary | ICD-10-CM

## 2015-07-12 DIAGNOSIS — Z7982 Long term (current) use of aspirin: Secondary | ICD-10-CM

## 2015-07-12 DIAGNOSIS — Z79899 Other long term (current) drug therapy: Secondary | ICD-10-CM

## 2015-07-12 DIAGNOSIS — K219 Gastro-esophageal reflux disease without esophagitis: Secondary | ICD-10-CM | POA: Diagnosis present

## 2015-07-12 DIAGNOSIS — Z8673 Personal history of transient ischemic attack (TIA), and cerebral infarction without residual deficits: Secondary | ICD-10-CM

## 2015-07-12 DIAGNOSIS — Z452 Encounter for adjustment and management of vascular access device: Secondary | ICD-10-CM

## 2015-07-12 HISTORY — DX: Heart failure, unspecified: I50.9

## 2015-07-12 LAB — COMPREHENSIVE METABOLIC PANEL
ALT: 74 U/L — AB (ref 17–63)
ANION GAP: 12 (ref 5–15)
AST: 44 U/L — ABNORMAL HIGH (ref 15–41)
Albumin: 4.5 g/dL (ref 3.5–5.0)
Alkaline Phosphatase: 58 U/L (ref 38–126)
BUN: 17 mg/dL (ref 6–20)
CALCIUM: 10.5 mg/dL — AB (ref 8.9–10.3)
CHLORIDE: 102 mmol/L (ref 101–111)
CO2: 23 mmol/L (ref 22–32)
CREATININE: 1.13 mg/dL (ref 0.61–1.24)
Glucose, Bld: 121 mg/dL — ABNORMAL HIGH (ref 65–99)
Potassium: 4.9 mmol/L (ref 3.5–5.1)
SODIUM: 137 mmol/L (ref 135–145)
Total Bilirubin: 1.2 mg/dL (ref 0.3–1.2)
Total Protein: 8.1 g/dL (ref 6.5–8.1)

## 2015-07-12 LAB — CBC
HEMATOCRIT: 47.9 % (ref 39.0–52.0)
Hemoglobin: 16.5 g/dL (ref 13.0–17.0)
MCH: 28.3 pg (ref 26.0–34.0)
MCHC: 34.4 g/dL (ref 30.0–36.0)
MCV: 82 fL (ref 78.0–100.0)
Platelets: 313 10*3/uL (ref 150–400)
RBC: 5.84 MIL/uL — AB (ref 4.22–5.81)
RDW: 12.8 % (ref 11.5–15.5)
WBC: 8.2 10*3/uL (ref 4.0–10.5)

## 2015-07-12 LAB — DIGOXIN LEVEL: DIGOXIN LVL: 1.4 ng/mL (ref 0.8–2.0)

## 2015-07-12 LAB — BRAIN NATRIURETIC PEPTIDE: B NATRIURETIC PEPTIDE 5: 69.8 pg/mL (ref 0.0–100.0)

## 2015-07-12 LAB — TROPONIN I

## 2015-07-12 MED ORDER — SODIUM CHLORIDE 0.9% FLUSH
3.0000 mL | Freq: Two times a day (BID) | INTRAVENOUS | Status: DC
  Administered 2015-07-12 – 2015-07-13 (×4): 3 mL via INTRAVENOUS

## 2015-07-12 MED ORDER — ACETAMINOPHEN 325 MG PO TABS: 650.0000 mg | ORAL_TABLET | ORAL | Status: DC | PRN

## 2015-07-12 MED ORDER — ONDANSETRON HCL 4 MG/2ML IJ SOLN: 4.0000 mg | Freq: Four times a day (QID) | INTRAMUSCULAR | Status: DC | PRN

## 2015-07-12 MED ORDER — SODIUM CHLORIDE 0.9 % IV SOLN: 250.0000 mL | INTRAVENOUS | Status: DC | PRN

## 2015-07-12 MED ORDER — ATORVASTATIN CALCIUM 80 MG PO TABS
80.0000 mg | ORAL_TABLET | Freq: Every day | ORAL | Status: DC
  Administered 2015-07-12 – 2015-07-13 (×2): 80 mg via ORAL
  Filled 2015-07-12 (×2): qty 1

## 2015-07-12 MED ORDER — ASPIRIN 81 MG PO CHEW
81.0000 mg | CHEWABLE_TABLET | Freq: Every day | ORAL | Status: DC
  Administered 2015-07-13 – 2015-07-14 (×2): 81 mg via ORAL
  Filled 2015-07-12 (×2): qty 1

## 2015-07-12 MED ORDER — ENOXAPARIN SODIUM 40 MG/0.4ML ~~LOC~~ SOLN
40.0000 mg | SUBCUTANEOUS | Status: DC
  Administered 2015-07-12 – 2015-07-13 (×2): 40 mg via SUBCUTANEOUS
  Filled 2015-07-12 (×2): qty 0.4

## 2015-07-12 MED ORDER — DIGOXIN 250 MCG PO TABS: 0.2500 mg | ORAL_TABLET | Freq: Every day | ORAL | Status: DC

## 2015-07-12 MED ORDER — SODIUM CHLORIDE 0.9% FLUSH: 3.0000 mL | INTRAVENOUS | Status: DC | PRN

## 2015-07-12 MED ORDER — ACETAMINOPHEN 325 MG PO TABS: 650.0000 mg | ORAL_TABLET | Freq: Four times a day (QID) | ORAL | Status: DC | PRN

## 2015-07-12 MED ORDER — SODIUM CHLORIDE 0.9 % IV BOLUS (SEPSIS)
500.0000 mL | Freq: Once | INTRAVENOUS | Status: AC
  Administered 2015-07-12: 500 mL via INTRAVENOUS

## 2015-07-12 NOTE — Telephone Encounter (Signed)
Mr. Baldry called to say that he was concerned because he had experienced dizziness, SOB and numbness in his legs on Saturday immediately after taking his morning medications.  He tells me that these symptoms are much better now--however wanted to let someone know.  He has been weighing himself daily and weight today is 160.lbs  versus 160.9 lbs on 4/20 (discharge day).  He denies any SOB at this time.  I explained that I will forward this message to a provider and return his call with any changes--he is aware and agreeable.

## 2015-07-12 NOTE — H&P (Signed)
Advanced Heart Failure H & P  [name] is a 45 year old male with past medical history of hypertension, GERD, previous smoker (quit 2 years ago). Recently admitted with new onset HF. He was discharged on coreg, spiro, dig, and entresto.  He presents for an acute work in. Today he called HF clinic complaining of dizziness and dyspnea. Has felt bad since discharge. Had difficulty walking due to fatigue. Had dizziness on Saturday. Complaining of fatigue and dyspnea. He has been limiting fluid intake to well below 2 liters per day. Taking all medications. Poor appetite.   Echo 07/04/15 LVEF 10-15%, normal RV  RHC/LHC  RA = 2 RV = 28/0/4 PA = 28/11 (18) PCW = 5 Fick cardiac output/index = 4.3/2.3 PVR = 3.0 WU FA sat = 94% PA sat = 60%, 63% Assessment: 1. Normal coronary arteries 2. Low filling pressures with normal cardiac output 3. Severe NICM with EF 10% by echo 3. Development of severe hypotension and shock in cath lab due to vasodilators to treat radial artery spasm   ROS: All systems negative except as listed in HPI, PMH and Problem List.  SH:  Social History   Social History  . Marital Status: Married    Spouse Name: N/A  . Number of Children: N/A  . Years of Education: N/A   Occupational History  . Not on file.   Social History Main Topics  . Smoking status: Former Smoker -- 2.00 packs/day for 20 years    Quit date: 03/20/2012  . Smokeless tobacco: Never Used  . Alcohol Use: No  . Drug Use: No  . Sexual Activity: Not on file   Other Topics Concern  . Not on file   Social History Narrative   Patient is married, no children.   Has pets   Works full time at [employer]     FH:  Family History  Problem Relation Age of Onset  . Hypertension Father   . Hyperlipidemia Father     No past medical history on file.  Current Outpatient Prescriptions  Medication Sig Dispense  Refill  . acetaminophen (TYLENOL) 325 MG tablet Take 650 mg by mouth every 6 (six) hours as needed for mild pain.    Marland Kitchen albuterol (PROVENTIL HFA;VENTOLIN HFA) 108 (90 BASE) MCG/ACT inhaler Inhale 2 puffs into the lungs every 6 (six) hours as needed for wheezing or shortness of breath. 1 Inhaler 0  . aspirin 81 MG chewable tablet Chew 1 tablet (81 mg total) by mouth daily. 30 tablet 3  . atorvastatin (LIPITOR) 80 MG tablet Take 1 tablet (80 mg total) by mouth daily at 6 PM. 30 tablet 3  . carvedilol (COREG) 3.125 MG tablet Take 1 tablet (3.125 mg total) by mouth 2 (two) times daily with a meal. 60 tablet 3  . digoxin (LANOXIN) 0.25 MG tablet Take 1 tablet (0.25 mg total) by mouth daily. 30 tablet 3  . DM-Doxylamine-Acetaminophen (NYQUIL COLD & FLU PO) Take 30 mLs by mouth every 8 (eight) hours as needed (fever).    Marland Kitchen ibuprofen (ADVIL,MOTRIN) 200 MG tablet Take 200 mg by mouth every 6 (six) hours as needed for moderate pain.    Marland Kitchen omeprazole (PRILOSEC) 20 MG capsule Take 1 capsule (20 mg total) by mouth daily. (Patient not taking: Reported on 07/02/2015) 30 capsule 3  . polyethylene glycol (MIRALAX / GLYCOLAX) packet Take 17 g by mouth daily. 14 each 0  . sacubitril-valsartan (ENTRESTO) 24-26 MG Take 1 tablet by mouth 2 (two) times  daily. 60 tablet 3  . spironolactone (ALDACTONE) 25 MG tablet Take 0.5 tablets (12.5 mg total) by mouth daily. 30 tablet 3   No current facility-administered medications for this encounter.    Filed Vitals:   07/12/15 1226  Pulse: 75  SpO2: 98%    PHYSICAL EXAM: 134/68 Lying 119/40 Sitting  General: Dyspneic at rest. Unable to walk  HEENT: normal Neck: supple. JVP flat. Carotids 2+ bilaterally; no bruits. No lymphadenopathy or thryomegaly appreciated. Cor: PMI normal. Regular rate & rhythm. No rubs, gallops or murmurs. Lungs: clear Abdomen: soft, nontender, nondistended. No  hepatosplenomegaly. No bruits or masses. Good bowel sounds. Extremities: no cyanosis, clubbing, rash, edema. RUE ecchymotic.  Neuro: Flat alert & orientedx3, cranial nerves grossly intact. Moves all 4 extremities w/o difficulty. Affect pleasant.   ECG: NSR 75 bpm   ASSESSMENT & PLAN: 1. A/C Systolic Heart Failure NICM. ECHO 06/2015 10-15%.  Recent admit with RHC/LHC. Norm Cors. Low filling pressure and adequate cardiac output. Initial presentation today he was SOB at rest. NYHA IV. Volume status low. He is orthostatic today. He has been limiting his fluids to well below 2 liters. SBP 134 lying flat and sitting 119. Give 1 liters NS now. EKG ok. Check labs CBC, CMET, dig level, troponin,  and BNP now.  Hold carvedilol, spiro, dig, and entresto for now.  Place PICC.  Admit for 24 hour observation.   Darrick Grinder NP-C  12:43 PM         Patient seen with NP, agree with the above note. He has been very lightheaded with standing since discharge, almost passing out once. He is orthostatic in the office today. He has been taking Coreg, Entresto, and spironolactone without missing doses. Filling pressures were quite low at time of recent RHC and he has been severely restricting po intake.   No volume overload on exam, extremities are cool. He gets lightheaded when he sits up.   I will admit today. Hold BP-active meds for now. Will give gentle IV fluid. We will place a PICC line and assess co-ox to get a rough idea of his cardiac output. Continue digoxin but check level.   Jerome Barnett 07/12/2015 12:55 PM

## 2015-07-12 NOTE — Telephone Encounter (Signed)
Pt has been readmitted 

## 2015-07-12 NOTE — Progress Notes (Signed)
20 g IV started in Left AC by Vilinda Blanks, RN.  NS started per Darrick Grinder, NP.  Report given to Lavella Lemons, RN on [place] pt will be transferred

## 2015-07-12 NOTE — Progress Notes (Signed)
Patient ID: Jerome Barnett, male   DOB: 01-01-1971, 45 y.o.   MRN: [id number]  PCP: Primary Cardiologist: Dr Haroldine Laws  HPI: Jerome Barnett is a 45 year old male with past medical history of hypertension, GERD, previous smoker (quit 2 years ago). Recently admitted with new onset HF. He was discharged on coreg, spiro, dig, and entresto.  He presents for an acute work in. Today he called HF clinic complaining of dizziness and dyspnea. Has felt bad since discharge. Had difficulty walking due to fatigue. Had dizziness on Saturday.  Complaining of fatigue and dyspnea. He has been limiting fluid intake to well below 2 liters per day. Taking all medications. Poor appetite.   Echo 07/04/15 LVEF 10-15%, normal RV  RHC/LHC  RA = 2 RV = 28/0/4 PA = 28/11 (18) PCW = 5 Fick cardiac output/index = 4.3/2.3 PVR = 3.0 WU FA sat = 94% PA sat = 60%, 63% Assessment: 1. Normal coronary arteries 2. Low filling pressures with normal cardiac output 3. Severe NICM with EF 10% by echo 3. Development of severe hypotension and shock in cath lab due to vasodilators to treat radial artery spasm   ROS: All systems negative except as listed in HPI, PMH and Problem List.  SH:  Social History   Social History  . Marital Status: Married    Spouse Name: N/A  . Number of Children: N/A  . Years of Education: N/A   Occupational History  . Not on file.   Social History Main Topics  . Smoking status: Former Smoker -- 2.00 packs/day for 20 years    Quit date: 03/20/2012  . Smokeless tobacco: Never Used  . Alcohol Use: No  . Drug Use: No  . Sexual Activity: Not on file   Other Topics Concern  . Not on file   Social History Narrative   Patient is married, no children.   Has pets   Works full time at [employer]     FH:  Family History  Problem Relation Age of Onset  . Hypertension Father   . Hyperlipidemia Father     No past medical history on file.  Current Outpatient Prescriptions   Medication Sig Dispense Refill  . acetaminophen (TYLENOL) 325 MG tablet Take 650 mg by mouth every 6 (six) hours as needed for mild pain.    Marland Kitchen albuterol (PROVENTIL HFA;VENTOLIN HFA) 108 (90 BASE) MCG/ACT inhaler Inhale 2 puffs into the lungs every 6 (six) hours as needed for wheezing or shortness of breath. 1 Inhaler 0  . aspirin 81 MG chewable tablet Chew 1 tablet (81 mg total) by mouth daily. 30 tablet 3  . atorvastatin (LIPITOR) 80 MG tablet Take 1 tablet (80 mg total) by mouth daily at 6 PM. 30 tablet 3  . carvedilol (COREG) 3.125 MG tablet Take 1 tablet (3.125 mg total) by mouth 2 (two) times daily with a meal. 60 tablet 3  . digoxin (LANOXIN) 0.25 MG tablet Take 1 tablet (0.25 mg total) by mouth daily. 30 tablet 3  . DM-Doxylamine-Acetaminophen (NYQUIL COLD & FLU PO) Take 30 mLs by mouth every 8 (eight) hours as needed (fever).    Marland Kitchen ibuprofen (ADVIL,MOTRIN) 200 MG tablet Take 200 mg by mouth every 6 (six) hours as needed for moderate pain.    Marland Kitchen omeprazole (PRILOSEC) 20 MG capsule Take 1 capsule (20 mg total) by mouth daily. (Patient not taking: Reported on 07/02/2015) 30 capsule 3  . polyethylene glycol (MIRALAX / GLYCOLAX) packet Take 17 g by mouth daily.  14 each 0  . sacubitril-valsartan (ENTRESTO) 24-26 MG Take 1 tablet by mouth 2 (two) times daily. 60 tablet 3  . spironolactone (ALDACTONE) 25 MG tablet Take 0.5 tablets (12.5 mg total) by mouth daily. 30 tablet 3   No current facility-administered medications for this encounter.    Filed Vitals:   07/12/15 1226  Pulse: 75  SpO2: 98%    PHYSICAL EXAM: 134/68 Lying 119/40 Sitting  General:  Dyspneic at rest. Unable to walk  HEENT: normal Neck: supple. JVP flat. Carotids 2+ bilaterally; no bruits. No lymphadenopathy or thryomegaly appreciated. Cor: PMI normal. Regular rate & rhythm. No rubs, gallops or murmurs. Lungs: clear Abdomen: soft, nontender, nondistended. No hepatosplenomegaly. No bruits or masses. Good bowel  sounds. Extremities: no cyanosis, clubbing, rash, edema. RUE ecchymotic.  Neuro: Flat alert & orientedx3, cranial nerves grossly intact. Moves all 4 extremities w/o difficulty. Affect pleasant.   ECG: NSR 75 bpm   ASSESSMENT & PLAN: 1. A/C Systolic Heart Failure NICM. ECHO 06/2015 10-15%.  Recent admit with RHC/LHC. Norm Cors. Low filling pressure and adequate cardiac output. Initial presentation today he was SOB at rest. NYHA IV. Volume status low. He is orthostatic today. He has been limiting his fluids to well below 2 liters.  SBP 134 lying flat and sitting 119.  Give 1 liters NS now. EKG ok. Check labs CBC, CMET, dig level, and BNP now.  Hold carvedilol, spiro, dig, and entresto for now.  Place PICC.  Admit for 24 hour observation.   Darrick Grinder NP-C  12:43 PM  Patient seen with NP, agree with the above note.  He has been very lightheaded with standing since discharge, almost passing out once.  He is orthostatic in the office today.  He has been taking Coreg, Entresto, and spironolactone without missing doses.  Filling pressures were quite low at time of recent RHC and he has been severely restricting po intake.    No volume overload on exam, extremities are cool.  He gets lightheaded when he sits up.   I will admit today.  Hold BP-active meds for now.  Will give gentle IV fluid.  We will place a PICC line and assess co-ox to get a rough idea of his cardiac output.  Continue digoxin but check level.   Loralie Champagne 07/12/2015 12:55 PM

## 2015-07-12 NOTE — Addendum Note (Signed)
Encounter addended by: Effie Berkshire, RN on: 07/12/2015  3:42 PM<BR>     Documentation filed: [employer], Orders

## 2015-07-12 NOTE — Telephone Encounter (Signed)
Patient called back to say that he was "not feeling well" again--experiencing similar symptoms.  After speaking with Darrick Grinder NP regarding earlier phone conversation I have asked him to come to clinic for "work-in appt".  Patient has someone who will drive him and plans to come within the next 30 minutes.

## 2015-07-13 ENCOUNTER — Observation Stay (HOSPITAL_COMMUNITY): Payer: Self-pay

## 2015-07-13 ENCOUNTER — Encounter (HOSPITAL_COMMUNITY): Payer: Self-pay | Admitting: *Deleted

## 2015-07-13 DIAGNOSIS — I5023 Acute on chronic systolic (congestive) heart failure: Secondary | ICD-10-CM

## 2015-07-13 DIAGNOSIS — I951 Orthostatic hypotension: Secondary | ICD-10-CM

## 2015-07-13 LAB — BASIC METABOLIC PANEL
ANION GAP: 11 (ref 5–15)
BUN: 16 mg/dL (ref 6–20)
CALCIUM: 9.1 mg/dL (ref 8.9–10.3)
CO2: 24 mmol/L (ref 22–32)
Chloride: 102 mmol/L (ref 101–111)
Creatinine, Ser: 1.1 mg/dL (ref 0.61–1.24)
GFR calc Af Amer: >60 mL/min (ref >60)
GFR calc non Af Amer: >60 mL/min (ref >60)
GLUCOSE: 109 mg/dL — AB (ref 65–99)
Potassium: 4.3 mmol/L (ref 3.5–5.1)
Sodium: 137 mmol/L (ref 135–145)

## 2015-07-13 LAB — CARBOXYHEMOGLOBIN
CARBOXYHEMOGLOBIN: 1.1 % (ref 0.5–1.5)
Methemoglobin: 0.7 % (ref 0.0–1.5)
O2 SAT: 73 %
TOTAL HEMOGLOBIN: 14.3 g/dL (ref 13.5–18.0)

## 2015-07-13 MED ORDER — SACUBITRIL-VALSARTAN 24-26 MG PO TABS
1.0000 | ORAL_TABLET | Freq: Two times a day (BID) | ORAL | Status: DC
  Administered 2015-07-13: 1 via ORAL
  Filled 2015-07-13 (×2): qty 1

## 2015-07-13 MED ORDER — SODIUM CHLORIDE 0.9% FLUSH: 10.0000 mL | INTRAVENOUS | Status: DC | PRN

## 2015-07-13 MED ORDER — SODIUM CHLORIDE 0.9% FLUSH
10.0000 mL | Freq: Two times a day (BID) | INTRAVENOUS | Status: DC
  Administered 2015-07-13 – 2015-07-14 (×2): 10 mL

## 2015-07-13 MED ORDER — CARVEDILOL 3.125 MG PO TABS
3.1250 mg | ORAL_TABLET | Freq: Two times a day (BID) | ORAL | Status: DC
  Administered 2015-07-13 – 2015-07-14 (×2): 3.125 mg via ORAL
  Filled 2015-07-13 (×2): qty 1

## 2015-07-13 NOTE — Progress Notes (Signed)
After PICC line placed by IV team, RN checked on pt, pt had c/o feeling little dizzy and some tightness in his chest. VSS.  Pt states he had a PICC line placed last week and he did not feel this afterwards. IV team was messaged, return call from [facility] that placed PICC line, she states placement went smoothly, line placed in a different vein this time, vein more toward the surface. [place] and EKG done by IV team with their equipment were normal with placement of line. Will continue to monitor pt and notify IV team and or MD as needed.  After speaking with IV team, pt feeling better, had eaten all of lunch. Instructed pt to call for nurse if he has any changes or feels any different.  Pt states he will call. Will update pt's direct care nurse (Melody) upon return to unit.

## 2015-07-13 NOTE — Progress Notes (Signed)
Peripherally Inserted Central Catheter/Midline Placement  The IV Nurse has discussed with the patient and/or persons authorized to consent for the patient, the purpose of this procedure and the potential benefits and risks involved with this procedure.  The benefits include less needle sticks, lab draws from the catheter and patient may be discharged home with the catheter.  Risks include, but not limited to, infection, bleeding, blood clot (thrombus formation), and puncture of an artery; nerve damage and irregular heat beat.  Alternatives to this procedure were also discussed.  PICC/Midline Placement Documentation        Jerome Barnett, [name] 07/13/2015, 12:30 PM

## 2015-07-13 NOTE — Progress Notes (Signed)
Advanced Heart Failure Rounding Note   Subjective:    Admitted from HF clinic with increased dyspnea and hypotension. Received 1 liter NS. HF meds held. Dig level 1.4. Dig stopped.   Overall feeling much better. Denies dizziness. Denies SOB.    Objective:   Weight Range:  Vital Signs:   Temp:  [98 F (36.7 C)-98.3 F (36.8 C)] 98 F (36.7 C) (04/25 0432) Pulse Rate:  [62-87] 75 (04/25 0000) Resp:  [15-24] 17 (04/25 0600) BP: (92-126)/(60-84) 125/84 mmHg (04/25 0600) SpO2:  [95 %-99 %] 96 % (04/25 0600) Weight:  [161 lb 1.6 oz (73.074 kg)-161 lb 13.1 oz (73.4 kg)] 161 lb 1.6 oz (73.074 kg) (04/25 0432) Last BM Date:  (PTA admission per pt )  Weight change: Filed Weights   07/12/15 1400 07/13/15 0432  Weight: 161 lb 13.1 oz (73.4 kg) 161 lb 1.6 oz (73.074 kg)    Intake/Output:   Intake/Output Summary (Last 24 hours) at 07/13/15 0740 Last data filed at 07/12/15 1600  Gross per 24 hour  Intake    500 ml  Output    300 ml  Net    200 ml     Physical Exam: Lying 121/80 Sitting 117/95 Standing 111/91 General:  Well appearing. No resp difficulty HEENT: normal Neck: supple. JVP flat  . Carotids 2+ bilat; no bruits. No lymphadenopathy or thryomegaly appreciated. Cor: PMI nondisplaced. Regular rate & rhythm. No rubs, gallops or murmurs. Lungs: clear Abdomen: soft, nontender, nondistended. No hepatosplenomegaly. No bruits or masses. Good bowel sounds. Extremities: no cyanosis, clubbing, rash, edema Neuro: alert & orientedx3, cranial nerves grossly intact. moves all 4 extremities w/o difficulty. Affect pleasant  Telemetry: NSR 70s   Labs: Basic Metabolic Panel:  Recent Labs Lab 07/07/15 0502 07/08/15 0520 07/12/15 1233 07/13/15 0301  NA 138 137 137 137  K 3.9 4.1 4.9 4.3  CL 104 102 102 102  CO2 24 24 23 24   GLUCOSE 114* 118* 121* 109*  BUN 17 13 17 16   CREATININE 0.96 0.81 1.13 1.10  CALCIUM 9.1 9.4 10.5* 9.1    Liver Function Tests:  Recent  Labs Lab 07/12/15 1233  AST 44*  ALT 74*  ALKPHOS 58  BILITOT 1.2  PROT 8.1  ALBUMIN 4.5   No results for input(s): LIPASE, AMYLASE in the last 168 hours. No results for input(s): AMMONIA in the last 168 hours.  CBC:  Recent Labs Lab 07/12/15 1233  WBC 8.2  HGB 16.5  HCT 47.9  MCV 82.0  PLT 313    Cardiac Enzymes:  Recent Labs Lab 07/12/15 1233  TROPONINI <0.03    BNP: BNP (last 3 results)  Recent Labs  07/02/15 1203 07/12/15 1233  BNP 545.5* 69.8    ProBNP (last 3 results) No results for input(s): PROBNP in the last 8760 hours.    Other results:  Imaging:  No results found.   Medications:     Scheduled Medications: . aspirin  81 mg Oral Daily  . atorvastatin  80 mg Oral q1800  . enoxaparin (LOVENOX) injection  40 mg Subcutaneous Q24H  . sodium chloride flush  3 mL Intravenous Q12H     Infusions:     PRN Medications:  sodium chloride, acetaminophen, acetaminophen, ondansetron (ZOFRAN) IV, sodium chloride flush   Assessment:  1. A/C Systolic HF 2. Orthostatic Hypotension 3. H/O TIA   Plan/Discussion:    Plan for PICC today to check CO-OX and CVP.  BP better today after fluid replacement.  Hold  off diuretics. Hold off on bb and Arni for now. Will not restrict fluid intake.   Continue aspirin and statin with history of TIA.    Consult cardia rehab.  Length of Stay:    Darrick Grinder NP-C  07/13/2015, 7:40 AM  Advanced Heart Failure Team Pager [phone] (M-F; 7a - 4p)  Please contact [facility] for night-coverage after hours (4p -7a ) and weekends on [email or web address]  Patient seen and examined with Darrick Grinder, NP. We discussed all aspects of the encounter. I agree with the assessment and plan as stated above.   Feels better after IVF. Co-ox this afternoon is 73%. CVP ~5. Suspect major issue was volume depletion. He is less tachycardic since initial admission and I am hopeful that he may be getting some LV recovery. Renal function  OK. Will start back carvedilol and Entresto. Hold spiro and digoxin. Check limited bedside echo. Liberalize fluid intake. Electrolytes ok.   Langdon Crosson,MD 4:22 PM

## 2015-07-14 LAB — BASIC METABOLIC PANEL
Anion gap: 8 (ref 5–15)
BUN: 15 mg/dL (ref 6–20)
CHLORIDE: 104 mmol/L (ref 101–111)
CO2: 25 mmol/L (ref 22–32)
CREATININE: 1 mg/dL (ref 0.61–1.24)
Calcium: 9.2 mg/dL (ref 8.9–10.3)
GFR calc Af Amer: >60 mL/min (ref >60)
GFR calc non Af Amer: >60 mL/min (ref >60)
Glucose, Bld: 110 mg/dL — ABNORMAL HIGH (ref 65–99)
Potassium: 4 mmol/L (ref 3.5–5.1)
SODIUM: 137 mmol/L (ref 135–145)

## 2015-07-14 LAB — CARBOXYHEMOGLOBIN
Carboxyhemoglobin: 1.2 % (ref 0.5–1.5)
Methemoglobin: 0.6 % (ref 0.0–1.5)
O2 Saturation: 67.3 %
TOTAL HEMOGLOBIN: 14.9 g/dL (ref 13.5–18.0)

## 2015-07-14 MED ORDER — LOSARTAN POTASSIUM 25 MG PO TABS: 12.5000 mg | ORAL_TABLET | Freq: Every day | ORAL | Status: DC

## 2015-07-14 MED ORDER — DIGOXIN 125 MCG PO TABS
0.1250 mg | ORAL_TABLET | Freq: Every day | ORAL | Status: DC
  Administered 2015-07-14: 0.125 mg via ORAL
  Filled 2015-07-14: qty 1

## 2015-07-14 MED ORDER — LOSARTAN POTASSIUM 25 MG PO TABS
12.5000 mg | ORAL_TABLET | Freq: Every day | ORAL | Status: DC
  Administered 2015-07-14: 12.5 mg via ORAL
  Filled 2015-07-14: qty 1

## 2015-07-14 MED ORDER — SPIRONOLACTONE 25 MG PO TABS
12.5000 mg | ORAL_TABLET | Freq: Every day | ORAL | Status: DC
  Administered 2015-07-14: 12.5 mg via ORAL
  Filled 2015-07-14: qty 1

## 2015-07-14 MED ORDER — DIGOXIN 250 MCG PO TABS: 0.1250 mg | ORAL_TABLET | Freq: Every day | ORAL | Status: DC

## 2015-07-14 MED FILL — LOSARTAN POTASSIUM 25 MG TA: 25 | 34 days supply | Qty: 17 | Fill #0

## 2015-07-14 NOTE — Discharge Summary (Signed)
Advanced Heart Failure Discharge Note   Discharge Summary   Patient ID: Jerome Barnett MRN: [id number], DOB/AGE: May 06, 1970 45 y.o. Admit date: 07/12/2015 D/C date:     07/14/2015   Primary Discharge Diagnoses:  1. A/C Systolic HF 2. Orthostatic Hypotension 3. H/O TIA  Hospital Course:   Mr. Melnikov is a 45 year old male with past medical history of hypertension, GERD, previous smoker (quit 2 years ago). Recently admitted with new onset HF. He was discharged on coreg, spiro, dig, and entresto.    He was acutely worked in to office visit on 07/12/15 with dizziness and dyspnea. He was limiting his fluid intake to WELL below 2 liters per day (as little as one glass of fluid). Was found to be orthostatic so admitted for observation.   Given 1 L of fluid rehydration and HF meds held. Dig stopped with Dig level 1.4.  Symptoms improved with fluid resuscitation and holding of meds. PICC line placed to look at cardiac output with concerns for contractile reserve with HR elevation with minimal activity during last admission. Mixed venous saturation 73% and CVP ~5. Coreg and Entresto started back.    Coox 67% back on Entresto and Coreg.  Entresto stopped in favor of losartan with pts zealous adherence to fluid limitation (less diuresis). Low dose spiro added back, and digoxin added back at decreased dose. Encouraged patient to be liberal with his fluid intake, as he does not typically drink > 2 L daily.  Bedside echo shows EV ~20%. Decision made to leave PICC line in place in the event of decompensation until pt follows up in clinic next week. [facility] contacted for [facility] and PICC line management.   Overall he was rehydrated with 1.4 L positive intake this admission. Weight stable.   He will be discharged in stable condition with PICC in place and close follow up in HF clinic next week as below.   Medication changes discussed in depth with patient.    Discharge Weight Range: 160 lbs Discharge Vitals:  Blood pressure 106/83, pulse 75, temperature 97.9 F (36.6 C), temperature source Oral, resp. rate 16, height 5\' 8"  (1.727 m), weight 160 lb 7.9 oz (72.8 kg), SpO2 96 %.  Labs: Lab Results  Component Value Date   WBC 8.2 07/12/2015   HGB 16.5 07/12/2015   HCT 47.9 07/12/2015   MCV 82.0 07/12/2015   PLT 313 07/12/2015    Recent Labs Lab 07/12/15 1233  07/14/15 0555  NA 137  < > 137  K 4.9  < > 4.0  CL 102  < > 104  CO2 23  < > 25  BUN 17  < > 15  CREATININE 1.13  < > 1.00  CALCIUM 10.5*  < > 9.2  PROT 8.1  --   --   BILITOT 1.2  --   --   ALKPHOS 58  --   --   ALT 74*  --   --   AST 44*  --   --   GLUCOSE 121*  < > 110*  < > = values in this interval not displayed. Lab Results  Component Value Date   CHOL 239* 07/02/2015   HDL 43 07/02/2015   LDLCALC 172* 07/02/2015   TRIG 122 07/02/2015   BNP (last 3 results)  Recent Labs  07/02/15 1203 07/12/15 1233  BNP 545.5* 69.8    ProBNP (last 3 results) No results for input(s): PROBNP in the last 8760 hours.   Diagnostic Studies/Procedures   Dg Chest  Port 1 View  07/13/2015  CLINICAL DATA:  PICC line. EXAM: PORTABLE CHEST 1 VIEW COMPARISON:  07/06/2015. FINDINGS: Right PICC line in stable position. mediastinum hilar structures normal. Cardiomegaly with normal pulmonary vascularity. No focal infiltrate. No pleural effusion or pneumothorax. IMPRESSION: Right PICC line in good anatomic position.  Stable cardiomegaly. Electronically Signed   By: Marcello Moores  Register   On: 07/13/2015 15:13    Discharge Medications     Medication List    STOP taking these medications        ibuprofen 200 MG tablet  Commonly known as:  ADVIL,MOTRIN     sacubitril-valsartan 24-26 MG  Commonly known as:  ENTRESTO      TAKE these medications        acetaminophen 325 MG tablet  Commonly known as:  TYLENOL  Take 650 mg by mouth every 6 (six) hours as needed for mild pain.     albuterol 108 (90 Base) MCG/ACT inhaler  Commonly known  as:  PROVENTIL HFA;VENTOLIN HFA  Inhale 2 puffs into the lungs every 6 (six) hours as needed for wheezing or shortness of breath.     aspirin 81 MG chewable tablet  Chew 1 tablet (81 mg total) by mouth daily.     atorvastatin 80 MG tablet  Commonly known as:  LIPITOR  Take 1 tablet (80 mg total) by mouth daily at 6 PM.     carvedilol 3.125 MG tablet  Commonly known as:  COREG  Take 1 tablet (3.125 mg total) by mouth 2 (two) times daily with a meal.     digoxin 0.25 MG tablet  Commonly known as:  LANOXIN  Take 0.5 tablets (0.125 mg total) by mouth daily.     losartan 25 MG tablet  Commonly known as:  COZAAR  Take 0.5 tablets (12.5 mg total) by mouth daily.     spironolactone 25 MG tablet  Commonly known as:  ALDACTONE  Take 0.5 tablets (12.5 mg total) by mouth daily.        Disposition   The patient will be discharged in stable condition to home. Discharge Instructions    Diet - low sodium heart healthy    Complete by:  As directed      Heart Failure patients record your daily weight using the same scale at the same time of day    Complete by:  As directed      Increase activity slowly    Complete by:  As directed           Follow-up Information    Follow up with Glori Bickers, MD On 07/19/2015.   Specialty:  Cardiology   Why:  at 9:20 [place] information:   [address] [place] [id number] [phone]         Duration of Discharge Encounter: Greater than 35 minutes   Signed, Annamaria Helling 07/14/2015, 2:38 PM  Patient seen and examined with Oda Kilts, PA-C. We discussed all aspects of the encounter. I agree with the assessment and plan as stated above.   He is improved. Agree with discharge today on above meds. Encouraged him to liberalize fluid intake to avoid volume depletion. Will keep PICC in for 1 week. Bedside echo done personally today and EF ~20%. F/u next week in HF [place],  Nysir Fergusson,MD 10:17 PM

## 2015-07-14 NOTE — Progress Notes (Signed)
Dc instructions given to pt at this time.  Pt verbalized understanding of all instructions.  CHF education done.  Pt has no s/s of any acute distress or c/o pain.  Last cvp 6.  VSS.

## 2015-07-14 NOTE — Progress Notes (Signed)
Advanced Heart Failure Rounding Note   Subjective:    Admitted from HF clinic with increased dyspnea and hypotension. Received 1 liter NS. HF meds held. Dig level 1.4. Dig stopped.   Yesterday PICC placed for CO-OX and CVP. Low dose carvedilol and entresto added. Todays CO-OX is 67%.   Denies dizziness. Denies SOB.    Objective:   Weight Range:  Vital Signs:   Temp:  [97.4 F (36.3 C)-98.4 F (36.9 C)] 97.4 F (36.3 C) (04/26 0400) Resp:  [11-30] 13 (04/26 0600) BP: (95-124)/(79-92) 106/85 mmHg (04/26 0600) SpO2:  [96 %-100 %] 98 % (04/26 0600) Weight:  [160 lb 7.9 oz (72.8 kg)] 160 lb 7.9 oz (72.8 kg) (04/26 0500) Last BM Date: 07/13/15  Weight change: Filed Weights   07/12/15 1400 07/13/15 0432 07/14/15 0500  Weight: 161 lb 13.1 oz (73.4 kg) 161 lb 1.6 oz (73.074 kg) 160 lb 7.9 oz (72.8 kg)    Intake/Output:   Intake/Output Summary (Last 24 hours) at 07/14/15 0724 Last data filed at 07/13/15 1800  Gross per 24 hour  Intake    960 ml  Output      0 ml  Net    960 ml     Physical Exam: Lying 109/83 Sitting 103/67 Standing 98/63  CVP 1 General:  Well appearing. No resp difficulty HEENT: normal Neck: supple. JVP flat  . Carotids 2+ bilat; no bruits. No lymphadenopathy or thryomegaly appreciated. Cor: PMI nondisplaced. Regular rate & rhythm. No rubs, gallops or murmurs. Lungs: clear Abdomen: soft, nontender, nondistended. No hepatosplenomegaly. No bruits or masses. Good bowel sounds. Extremities: no cyanosis, clubbing, rash, edema Neuro: alert & orientedx3, cranial nerves grossly intact. moves all 4 extremities w/o difficulty. Affect pleasant  Telemetry: NSR 70-80s   Labs: Basic Metabolic Panel:  Recent Labs Lab 07/08/15 0520 07/12/15 1233 07/13/15 0301 07/14/15 0555  NA 137 137 137 137  K 4.1 4.9 4.3 4.0  CL 102 102 102 104  CO2 24 23 24 25   GLUCOSE 118* 121* 109* 110*  BUN 13 17 16 15   CREATININE 0.81 1.13 1.10 1.00  CALCIUM 9.4 10.5*  9.1 9.2    Liver Function Tests:  Recent Labs Lab 07/12/15 1233  AST 44*  ALT 74*  ALKPHOS 58  BILITOT 1.2  PROT 8.1  ALBUMIN 4.5   No results for input(s): LIPASE, AMYLASE in the last 168 hours. No results for input(s): AMMONIA in the last 168 hours.  CBC:  Recent Labs Lab 07/12/15 1233  WBC 8.2  HGB 16.5  HCT 47.9  MCV 82.0  PLT 313    Cardiac Enzymes:  Recent Labs Lab 07/12/15 1233  TROPONINI <0.03    BNP: BNP (last 3 results)  Recent Labs  07/02/15 1203 07/12/15 1233  BNP 545.5* 69.8    ProBNP (last 3 results) No results for input(s): PROBNP in the last 8760 hours.    Other results:  Imaging: Dg Chest Port 1 View  07/13/2015  CLINICAL DATA:  PICC line. EXAM: PORTABLE CHEST 1 VIEW COMPARISON:  07/06/2015. FINDINGS: Right PICC line in stable position. mediastinum hilar structures normal. Cardiomegaly with normal pulmonary vascularity. No focal infiltrate. No pleural effusion or pneumothorax. IMPRESSION: Right PICC line in good anatomic position.  Stable cardiomegaly. Electronically Signed   By: Marcello Moores  Register   On: 07/13/2015 15:13     Medications:     Scheduled Medications: . aspirin  81 mg Oral Daily  . atorvastatin  80 mg Oral q1800  .  carvedilol  3.125 mg Oral BID WC  . enoxaparin (LOVENOX) injection  40 mg Subcutaneous Q24H  . sacubitril-valsartan  1 tablet Oral BID  . sodium chloride flush  10-40 mL Intracatheter Q12H  . sodium chloride flush  3 mL Intravenous Q12H    Infusions:    PRN Medications: sodium chloride, acetaminophen, acetaminophen, ondansetron (ZOFRAN) IV, sodium chloride flush, sodium chloride flush   Assessment:  1. A/C Systolic HF 2. Orthostatic Hypotension 3. H/O TIA   Plan/Discussion:    Restarted on entresto and carvedilol. CVP down from 1. A little orthostatic. Stop entresto. Start losartan 12.5 mg daily. Encouraged to drink fluids. Portable ECHO later today to reassess EF.   Continue aspirin and  statin with history of TIA.    Consult [facility]ia rehab.   Possible D/C later today.  Length of Stay: 1   Amy Clegg NP-C  07/14/2015, 7:24 AM  Advanced Heart Failure Team Pager [phone] (M-F; 7a - 4p)  Please contact [facility] for night-coverage after hours (4p -7a ) and weekends on [email or web address]  Patient seen and examined with Darrick Grinder, NP and Oda Kilts PA-C. We discussed all aspects of the encounter. I agree with the assessment and plan as stated above.   He is improved. Agree with discharge today. Encouraged him to liberalize fluid intake to avoid volume depletion. Will keep PICC in for 1 week. Bedside echo done personally today and EF ~20%. F/u next week in HF Clinic.  Bensimhon, Daniel,MD 10:18 PM

## 2015-07-14 NOTE — Progress Notes (Addendum)
Spoke w phy assit. Pt gets meds thru heart failure clinic. Pt states has gone to [facility] prev. Gave him [place] [facility] list. He will follow up w heart failure clinic. Given entresto 30day free card last hospital. [facility] to follow for [facility]n for picc line card.

## 2015-07-15 ENCOUNTER — Inpatient Hospital Stay (HOSPITAL_COMMUNITY): Payer: Self-pay

## 2015-07-16 DIAGNOSIS — I5021 Acute systolic (congestive) heart failure: Secondary | ICD-10-CM

## 2015-07-16 DIAGNOSIS — I110 Hypertensive heart disease with heart failure: Secondary | ICD-10-CM

## 2015-07-19 ENCOUNTER — Ambulatory Visit (HOSPITAL_COMMUNITY)
Admit: 2015-07-19 | Discharge: 2015-07-19 | Disposition: A | Discharge status: Home / Self Care | Payer: Self-pay | Source: Ambulatory Visit | Attending: Internal Medicine | Admitting: Internal Medicine

## 2015-07-19 ENCOUNTER — Ambulatory Visit (HOSPITAL_BASED_OUTPATIENT_CLINIC_OR_DEPARTMENT_OTHER)
Admission: RE | Admit: 2015-07-19 | Discharge: 2015-07-19 | Disposition: A | Discharge status: Home / Self Care | Payer: Self-pay | Source: Ambulatory Visit | Attending: Internal Medicine | Admitting: Internal Medicine

## 2015-07-19 ENCOUNTER — Telehealth: Payer: Self-pay | Admitting: Internal Medicine

## 2015-07-19 ENCOUNTER — Encounter (HOSPITAL_COMMUNITY): Payer: Self-pay | Admitting: Internal Medicine

## 2015-07-19 VITALS — BP 114/64 | HR 83 | Wt 163.0 lb

## 2015-07-19 DIAGNOSIS — R609 Edema, unspecified: Secondary | ICD-10-CM

## 2015-07-19 DIAGNOSIS — Z79899 Other long term (current) drug therapy: Secondary | ICD-10-CM | POA: Insufficient documentation

## 2015-07-19 DIAGNOSIS — Z7982 Long term (current) use of aspirin: Secondary | ICD-10-CM | POA: Insufficient documentation

## 2015-07-19 DIAGNOSIS — Z87891 Personal history of nicotine dependence: Secondary | ICD-10-CM | POA: Insufficient documentation

## 2015-07-19 DIAGNOSIS — I5023 Acute on chronic systolic (congestive) heart failure: Secondary | ICD-10-CM

## 2015-07-19 DIAGNOSIS — Z888 Allergy status to other drugs, medicaments and biological substances status: Secondary | ICD-10-CM | POA: Insufficient documentation

## 2015-07-19 DIAGNOSIS — I5021 Acute systolic (congestive) heart failure: Secondary | ICD-10-CM | POA: Insufficient documentation

## 2015-07-19 DIAGNOSIS — M7989 Other specified soft tissue disorders: Secondary | ICD-10-CM | POA: Insufficient documentation

## 2015-07-19 LAB — BASIC METABOLIC PANEL
ANION GAP: 9 (ref 5–15)
BUN: 13 mg/dL (ref 6–20)
CALCIUM: 9.6 mg/dL (ref 8.9–10.3)
CO2: 27 mmol/L (ref 22–32)
Chloride: 104 mmol/L (ref 101–111)
Creatinine, Ser: 0.97 mg/dL (ref 0.61–1.24)
GFR calc Af Amer: >60 mL/min (ref >60)
GLUCOSE: 112 mg/dL — AB (ref 65–99)
Potassium: 4.5 mmol/L (ref 3.5–5.1)
Sodium: 140 mmol/L (ref 135–145)

## 2015-07-19 LAB — DIGOXIN LEVEL: Digoxin Level: 0.6 ng/mL — ABNORMAL LOW (ref 0.8–2.0)

## 2015-07-19 MED ORDER — LOSARTAN POTASSIUM 25 MG PO TABS: 25.0000 mg | ORAL_TABLET | Freq: Every day | ORAL | Status: DC

## 2015-07-19 MED ORDER — TORSEMIDE 20 MG PO TABS: 10.0000 mg | ORAL_TABLET | Freq: Two times a day (BID) | ORAL | Status: DC

## 2015-07-19 NOTE — Progress Notes (Addendum)
Advanced Heart Failure Medication Review by a Pharmacist  Does the patient  feel that his/her medications are working for him/her?  yes  Has the patient been experiencing any side effects to the medications prescribed?  no  Does the patient measure his/her own blood pressure or blood glucose at home?  no   Does the patient have any problems obtaining medications due to transportation or finances?   No - HF fund  Understanding of regimen: good Understanding of indications: good Potential of compliance: good Patient understands to avoid NSAIDs. Patient understands to avoid decongestants.  Issues to address at subsequent visits: None   Pharmacist comments:  Jerome Barnett is a pleasant 45 yo M presenting with his sister and without a medication list but with excellent recall of his regimen including dosages. He reports good compliance with his regimen and states that at times he still has some dizziness. He asked if he would be restarted on Entresto and we talked about the side effects of this medication including dizziness which he is still having at this time on very low doses of HF medications. He did not have any other medication-related questions or concerns for me at this time.   Ruta Hinds. Velva Harman, PharmD, BCPS, CPP Clinical Pharmacist Pager: [phone] Phone: [phone] 07/19/2015 9:43 AM      Time with patient: 8 minutes Preparation and documentation time: 2 minutes Total time: 10 minutes

## 2015-07-19 NOTE — Telephone Encounter (Signed)
APPT. REMINDER CALL, LMTCB °

## 2015-07-19 NOTE — Progress Notes (Signed)
*  PRELIMINARY RESULTS* Vascular Ultrasound Right upper extremity venous duplex has been completed.  Preliminary findings: negative for DVT. Superficial thrombosis noted in the right cephalic vein, from the mid upper arm to axilla level.   Called results to [place].   Landry Mellow, RDMS, RVT  07/19/2015, 3:08 PM

## 2015-07-19 NOTE — Progress Notes (Addendum)
Patient ID: Jerome Barnett, male   DOB: [birth year], 45 y.o.   MRN: [id number]   ADVANCED HF CLINIC CONSULT NOTE   HPI:  Jerome Barnett is a 45 year old male with h/o HL, HTN and GERD who was admitted in 4/17 with acute HF. EF 15%.  Underwent cath in 4/17 normal cors with low filling pressures and normal cardiac output. Procedure complicated by radial artery spasm requiring multiple rounds of verapamil and NTG. Patient then developed shock and was supported with norepi and milrinone transiently. Was discharged home and then readmitted several days later with hypotension in setting of volume depletion due to severe restriction of po intake.    Feels good. More energy. Liberalizing intake a bit. Weight 160->163. No problems with medicines. Occasionally dizzy when he gets up. No problem with PICC site. SBP range 105-125. Walking dogs 15 mins per day .  Echo 07/04/15 LVEF 10-15%, normal RV  RHC/LHC  RA = 2 RV = 28/0/4 PA = 28/11 (18) PCW = 5 Fick cardiac output/index = 4.3/2.3 PVR = 3.0 WU FA sat = 94% PA sat = 60%, 63% Assessment: 1. Normal coronary arteries 2. Low filling pressures with normal cardiac output 3. Severe NICM with EF 10% by echo 3. Development of severe hypotension and shock in cath lab due to vasodilators to treat radial artery spasm    Current Outpatient Prescriptions  Medication Sig Dispense Refill  . acetaminophen (TYLENOL) 325 MG tablet Take 650 mg by mouth every 6 (six) hours as needed for mild pain.    Marland Kitchen aspirin 81 MG chewable tablet Chew 1 tablet (81 mg total) by mouth daily. 30 tablet 3  . atorvastatin (LIPITOR) 80 MG tablet Take 1 tablet (80 mg total) by mouth daily at 6 PM. 30 tablet 3  . carvedilol (COREG) 3.125 MG tablet Take 1 tablet (3.125 mg total) by mouth 2 (two) times daily with a meal. 60 tablet 3  . digoxin (LANOXIN) 0.25 MG tablet Take 0.5 tablets (0.125 mg total) by mouth daily. 30 tablet 3  . losartan (COZAAR) 25 MG tablet Take 0.5 tablets  (12.5 mg total) by mouth daily. 15 tablet 3  . spironolactone (ALDACTONE) 25 MG tablet Take 0.5 tablets (12.5 mg total) by mouth daily. 30 tablet 3   No current facility-administered medications for this encounter.    Allergies  Allergen Reactions  . Amoxicillin Swelling    Eye swelling  . Lasix [Furosemide] Swelling    Eye swelling      Social History   Social History  . Marital Status: Married    Spouse Name: N/A  . Number of Children: N/A  . Years of Education: N/A   Occupational History  . Not on file.   Social History Main Topics  . Smoking status: Former Smoker -- 2.00 packs/day for 20 years    Quit date: 03/20/2012  . Smokeless tobacco: Never Used  . Alcohol Use: No  . Drug Use: No  . Sexual Activity: Not on file   Other Topics Concern  . Not on file   Social History Narrative   Patient is married, no children.   Has pets   Works full time at [employer]       Family History  Problem Relation Age of Onset  . Hypertension Father   . Hyperlipidemia Father     Danley Danker Vitals:   07/19/15 0932  BP: 114/64  Pulse: 83  Weight: 163 lb (73.936 kg)  SpO2: 100%    PHYSICAL  EXAM: General:  Well appearing. No respiratory difficulty HEENT: normal Neck: supple. no JVD. Carotids 2+ bilat; no bruits. No lymphadenopathy or thryomegaly appreciated. Cor: PMI laterally displaced. Regular rate & rhythm. No rubs, gallops or murmurs. Lungs: clear Abdomen: soft, nontender, nondistended. No hepatosplenomegaly. No bruits or masses. Good bowel sounds. Extremities: no cyanosis, clubbing, rash, edema. RUE PICC with mild swelling Neuro: alert & oriented x 3, cranial nerves grossly intact. moves all 4 extremities w/o difficulty. Affect pleasant.   ASSESSMENT & PLAN: 1. Acute systolic heart failure - cath 4/17 with normal coronaries. EF 15%. ? Viral CM --NYHA II. Volume status looks good. Will increase losartan to 25 mg daily. Will give lasix 20mg  to use as needed for  swelling.  --Labs normal --Can start with half days until I see him back.  --I pulled PICC personally in clinic 2. RUE swelling.  --Check u/s to exclude DVT  Nastasha Reising,MD 2:03 PM

## 2015-07-19 NOTE — Patient Instructions (Signed)
Increase Losartan 25 mg (1 tab) daily  Take Torsemide 10 mg (1/2 tab) AS NEEDED FOR SWELLING ONLY  Labs today  Your physician has requested that you have a lower or upper extremity venous duplex. This test is an ultrasound of the veins in the legs or arms. It looks at venous blood flow that carries blood from the heart to the legs or arms. Allow one hour for a Lower Venous exam. Allow thirty minutes for an Upper Venous exam. There are no restrictions or special instructions.  Your physician recommends that you schedule a follow-up appointment in: 4 weeks

## 2015-07-20 ENCOUNTER — Telehealth: Payer: Self-pay | Admitting: Licensed Clinical Social Worker

## 2015-07-20 ENCOUNTER — Telehealth (HOSPITAL_COMMUNITY): Payer: Self-pay

## 2015-07-20 ENCOUNTER — Encounter: Payer: Self-pay | Admitting: Internal Medicine

## 2015-07-20 ENCOUNTER — Ambulatory Visit (INDEPENDENT_AMBULATORY_CARE_PROVIDER_SITE_OTHER): Payer: Self-pay | Admitting: Internal Medicine

## 2015-07-20 VITALS — BP 111/82 | HR 86 | Temp 97.9°F | Ht 68.0 in | Wt 165.1 lb

## 2015-07-20 DIAGNOSIS — I5020 Unspecified systolic (congestive) heart failure: Secondary | ICD-10-CM

## 2015-07-20 DIAGNOSIS — D224 Melanocytic nevi of scalp and neck: Secondary | ICD-10-CM

## 2015-07-20 DIAGNOSIS — D229 Melanocytic nevi, unspecified: Secondary | ICD-10-CM

## 2015-07-20 DIAGNOSIS — I509 Heart failure, unspecified: Secondary | ICD-10-CM

## 2015-07-20 MED ORDER — LOSARTAN POTASSIUM 25 MG PO TABS: 12.5000 mg | ORAL_TABLET | Freq: Every day | ORAL | Status: DC

## 2015-07-20 MED ORDER — TORSEMIDE 20 MG PO TABS: ORAL_TABLET | ORAL | Status: DC

## 2015-07-20 MED ORDER — TORSEMIDE 10 MG PO TABS: ORAL_TABLET | ORAL | Status: DC

## 2015-07-20 MED FILL — TORSEMIDE 20 MG TABLET: 20 | 30 days supply | Qty: 30 | Fill #0

## 2015-07-20 NOTE — Progress Notes (Signed)
Patient ID: Jerome Barnett, male   DOB: 08-17-1970, 45 y.o.   MRN: [id number] [facility] Subjective:   Patient ID: Jerome Barnett male   DOB: 1970/11/16 45 y.o.   MRN: [id number]  HPI: [name] is a 45 y.o. male with non-ischemic heart failure with reduced ejection fraction of 10-15%, presumed to be viral myocarditis, and hyperlipidemia presenting to clinic for follow-up of heart failure and to establish care.  Non-ischemic heart failure with reduced ejection fraction of 10-15%: In mid-April, be had a viral URI. Two weeks later, be began having paroxysmal nocturna dyspnea. He was admitted and found to have an ejection fraction 10-15%. Left heart cath showed clean coronaries. He has since followed closely with Heart Failure clinic. His weights have been stable between 160-163 on his current regimen and he denise orthopnea, paroxysmal nocturnal dyspnea, or lower extremity edema.  Compound nevus: He's had a slowly-growing exophytic papule on his right lateral neck. This does not bleed and he does not have a history of melanoma in his family. He'd like to keep an eye on it for now.  Review of Systems  Constitutional: Negative for fever, chills, weight loss and malaise/fatigue.  Respiratory: Negative for cough and shortness of breath.   Cardiovascular: Negative for chest pain, palpitations, orthopnea, leg swelling and PND.  Skin: Negative for rash.  Neurological: Negative for dizziness and headaches.   Objective:  Physical Exam: Filed Vitals:   07/20/15 1053  BP: 111/82  Pulse: 86  Temp: 97.9 F (36.6 C)  TempSrc: Oral  Height: 5\' 8"  (1.727 m)  Weight: 165 lb 1.6 oz (74.889 kg)  SpO2: 99%   General: friendly guy resting in chaircomfortably, appropriately conversational Cardiac: regular rate and rhythm, no rubs, murmurs or gallops, no JVD Pulm: breathing well, clear to auscultation bilaterally, without bibasilar crackles Abd: bowel sounds  normal, soft, nondistended, non-tender Ext: warm and well perfused, without pedal edema Lymph: no cervical or supraclavicular lymphadenopathy Skin: on his right lateral neck, he has a 2cm exophytic papule with patchy hyperpigmentation; this lesion has been slowly growing since he was a teenager    Assessment & Plan:  Case discussed with Dr. Eppie Gibson  Heart failure with reduced ejection fraction, NYHA class II ([place]) He has non-ischemic heart failure with reduced ejection fraction of 10-15% following a viral URI, concerning for viral myocarditis. His dry weight is 160lbs without clothes and he is 165lbs today.  He clinically appears hypovolemic and he is orthostatic, so we've dropped his spironolactone dos from 25 to 12.5mg  daily and I've asked him to resume losartan 12.5mg  daily, carvedilol 3.125mg  daily, and torsemide 10mg  daily as needed for leg swelling or weight increase greater than 170lbs. He's had a fixed drug reaction to furosemide so I'm hoping he won't have the same with the torsemide. If he does, we can try bumetanide. He has follow-up with Dr. Sung Amabile near the end of April and he'll get a follow-up TTE around September.  Compound nevus He has an atypical compound nevus on his right lateral neck that has been slowly growing since he was a teenager. This does not spontaneously bleed and he does not have a family history of melanoma. This does not appear to be clear-cut melanoma, but it's at least a dysplastic nevus. Given it's atypical character and facial location, we will refer him to Dermatology for evaluation of excisional biopsy once he gets his [facility] card.   Medications Ordered Meds ordered this encounter  Medications  . losartan (COZAAR)  25 MG tablet    Sig: Take 0.5 tablets (12.5 mg total) by mouth daily.    Dispense:  15 tablet    Refill:  3  . DISCONTD: torsemide (DEMADEX) 20 MG tablet    Sig: If weight increases to 170 or legs start to swell, take 1 pill daily until  weight normalizes    Dispense:  30 tablet    Refill:  3  . torsemide (DEMADEX) 10 MG tablet    Sig: If weight increases to 170 or legs start to swell, take 1 pill daily until weight normalizes    Dispense:  30 tablet    Refill:  1   Other Orders No orders of the defined types were placed in this encounter.   Follow Up: Return in about 4 weeks (around 08/17/2015).

## 2015-07-20 NOTE — Telephone Encounter (Signed)
CSW referred to assist with insurance and PCP. CSW discussed options and patient shared he has an appointment in the IM clinic today. CSW encouraged patient to follow up with IM clinic for financial counselor/orange card. Patient verbalizes understanding of follow up and will follow up with CSW on next clinic visit. Raquel Sarna, LCSW [phone]

## 2015-07-20 NOTE — Telephone Encounter (Signed)
CSW referred to assist patient with insurance and PCP. CSW contacted patient to discuss options. Patient plans to return to work next week 20 hours. Patient states he has an appointment today in the IM clinic for a post hospital follow up. CSW encouraged patient to follow up with financial counselor today in the IM clinic and he also has a pending medicaid application. Patient verbalizes understanding of call and will contact CSW if further needs arise. Raquel Sarna, LCSW [phone]

## 2015-07-20 NOTE — Telephone Encounter (Signed)
Aaron Edelman RN with [facility] called to report patient has requested to end services with [facility] since his PICC was discontinued yesterday. Nurse voices that she feels this is appropriate, patient doing well, and does not present to have any needs at this time. Patient also has upcoming f.u apt with CHF clinic as well and knows to call our office with any needs/issues/concerns.  Renee Pain

## 2015-07-20 NOTE — Assessment & Plan Note (Addendum)
He has an atypical compound nevus on his right lateral neck that has been slowly growing since he was a teenager. This does not spontaneously bleed and he does not have a family history of melanoma. This does not appear to be clear-cut melanoma, but it's at least a dysplastic nevus. Given it's atypical character and facial location, we will refer him to Dermatology for evaluation of excisional biopsy once he gets his [facility] card.

## 2015-07-20 NOTE — Assessment & Plan Note (Addendum)
He has non-ischemic heart failure with reduced ejection fraction of 10-15% following a viral URI, concerning for viral myocarditis. His dry weight is 160lbs without clothes and he is 165lbs today.  He clinically appears hypovolemic and he is orthostatic, so we've dropped his spironolactone dos from 25 to 12.5mg  daily and I've asked him to resume losartan 12.5mg  daily, carvedilol 3.125mg  daily, and torsemide 10mg  daily as needed for leg swelling or weight increase greater than 170lbs. He's had a fixed drug reaction to furosemide so I'm hoping he won't have the same with the torsemide. If he does, we can try bumetanide. He has follow-up with Dr. Sung Amabile near the end of April and he'll get a follow-up TTE around September.

## 2015-07-20 NOTE — Progress Notes (Signed)
Case discussed with Dr. Flores at the time of the visit.  We reviewed the resident's history and exam and pertinent patient test results.  I agree with the assessment, diagnosis and plan of care documented in the resident's note. 

## 2015-07-24 ENCOUNTER — Telehealth: Payer: Self-pay | Admitting: Nurse Practitioner

## 2015-07-24 NOTE — Telephone Encounter (Signed)
   Pts wife called to report that pt is dyspneic and LH this am.  HR 60, SBP 118.  No chest pain.  Wt has been stable between 160 and 161 lbs.  No new swelling.  I rec that in the absence of objective findings to explain dyspnea, that he is best served by either an ER or urgent care visit for evaluation, lab work, cxr, Social research officer, government.  Caller verbalized understanding and was grateful for the call back.  Murray Hodgkins, NP 07/24/2015, 11:03 AM

## 2015-08-03 ENCOUNTER — Ambulatory Visit (HOSPITAL_COMMUNITY)
Admission: RE | Admit: 2015-08-03 | Discharge: 2015-08-03 | Disposition: A | Discharge status: Home / Self Care | Payer: Self-pay | Source: Ambulatory Visit | Attending: Internal Medicine | Admitting: Internal Medicine

## 2015-08-03 ENCOUNTER — Encounter: Payer: Self-pay | Admitting: Internal Medicine

## 2015-08-03 ENCOUNTER — Other Ambulatory Visit: Payer: Self-pay

## 2015-08-03 ENCOUNTER — Emergency Department (HOSPITAL_COMMUNITY): Payer: MEDICAID

## 2015-08-03 ENCOUNTER — Telehealth: Payer: Self-pay | Admitting: *Deleted

## 2015-08-03 ENCOUNTER — Ambulatory Visit (INDEPENDENT_AMBULATORY_CARE_PROVIDER_SITE_OTHER): Payer: Self-pay | Admitting: Internal Medicine

## 2015-08-03 ENCOUNTER — Encounter (HOSPITAL_COMMUNITY): Payer: Self-pay | Admitting: Nurse Practitioner

## 2015-08-03 ENCOUNTER — Emergency Department (HOSPITAL_COMMUNITY)
Admission: EM | Admit: 2015-08-03 | Discharge: 2015-08-04 | Disposition: A | Discharge status: Home / Self Care | Payer: MEDICAID | Attending: Emergency Medicine | Admitting: Emergency Medicine

## 2015-08-03 VITALS — BP 87/62 | HR 84 | Temp 97.5°F | Resp 24 | Ht 69.0 in | Wt 160.6 lb

## 2015-08-03 DIAGNOSIS — I509 Heart failure, unspecified: Secondary | ICD-10-CM

## 2015-08-03 DIAGNOSIS — Z87891 Personal history of nicotine dependence: Secondary | ICD-10-CM | POA: Insufficient documentation

## 2015-08-03 DIAGNOSIS — Z7982 Long term (current) use of aspirin: Secondary | ICD-10-CM | POA: Insufficient documentation

## 2015-08-03 DIAGNOSIS — R0600 Dyspnea, unspecified: Secondary | ICD-10-CM | POA: Insufficient documentation

## 2015-08-03 DIAGNOSIS — I5020 Unspecified systolic (congestive) heart failure: Secondary | ICD-10-CM

## 2015-08-03 DIAGNOSIS — I951 Orthostatic hypotension: Secondary | ICD-10-CM

## 2015-08-03 LAB — BASIC METABOLIC PANEL
Anion gap: 16 — ABNORMAL HIGH (ref 5–15)
BUN: 16 mg/dL (ref 6–20)
CALCIUM: 10.2 mg/dL (ref 8.9–10.3)
CHLORIDE: 99 mmol/L — AB (ref 101–111)
CO2: 24 mmol/L (ref 22–32)
CREATININE: 1.13 mg/dL (ref 0.61–1.24)
GFR calc non Af Amer: >60 mL/min (ref >60)
Glucose, Bld: 117 mg/dL — ABNORMAL HIGH (ref 65–99)
Potassium: 3.8 mmol/L (ref 3.5–5.1)
Sodium: 139 mmol/L (ref 135–145)

## 2015-08-03 LAB — CBC WITH DIFFERENTIAL/PLATELET
Basophils Absolute: 0 10*3/uL (ref 0.0–0.1)
Basophils Relative: 0 %
Eosinophils Absolute: 0.2 10*3/uL (ref 0.0–0.7)
Eosinophils Relative: 4 %
HCT: 41.9 % (ref 39.0–52.0)
Hemoglobin: 14.1 g/dL (ref 13.0–17.0)
Lymphocytes Relative: 18 %
Lymphs Abs: 1.2 10*3/uL (ref 0.7–4.0)
MCH: 27.6 pg (ref 26.0–34.0)
MCHC: 33.7 g/dL (ref 30.0–36.0)
MCV: 82.2 fL (ref 78.0–100.0)
Monocytes Absolute: 0.5 10*3/uL (ref 0.1–1.0)
Monocytes Relative: 7 %
Neutro Abs: 4.5 10*3/uL (ref 1.7–7.7)
Neutrophils Relative %: 71 %
Platelets: 239 10*3/uL (ref 150–400)
RBC: 5.1 MIL/uL (ref 4.22–5.81)
RDW: 12.5 % (ref 11.5–15.5)
WBC: 6.3 10*3/uL (ref 4.0–10.5)

## 2015-08-03 LAB — BRAIN NATRIURETIC PEPTIDE: B Natriuretic Peptide: 83.9 pg/mL (ref 0.0–100.0)

## 2015-08-03 LAB — BASIC METABOLIC PANEL WITH GFR
Anion gap: 10 (ref 5–15)
BUN: 15 mg/dL (ref 6–20)
CO2: 27 mmol/L (ref 22–32)
Calcium: 9.8 mg/dL (ref 8.9–10.3)
Chloride: 101 mmol/L (ref 101–111)
Creatinine, Ser: 1.1 mg/dL (ref 0.61–1.24)
GFR calc Af Amer: >60 mL/min
GFR calc non Af Amer: >60 mL/min
Glucose, Bld: 96 mg/dL (ref 65–99)
Potassium: 4 mmol/L (ref 3.5–5.1)
Sodium: 138 mmol/L (ref 135–145)

## 2015-08-03 LAB — CBC
HCT: 42.1 % (ref 39.0–52.0)
Hemoglobin: 14 g/dL (ref 13.0–17.0)
MCH: 27.3 pg (ref 26.0–34.0)
MCHC: 33.3 g/dL (ref 30.0–36.0)
MCV: 82.2 fL (ref 78.0–100.0)
Platelets: 281 10*3/uL (ref 150–400)
RBC: 5.12 MIL/uL (ref 4.22–5.81)
RDW: 12.5 % (ref 11.5–15.5)
WBC: 6 10*3/uL (ref 4.0–10.5)

## 2015-08-03 LAB — D-DIMER, QUANTITATIVE: D-Dimer, Quant: <0.27 ug{FEU}/mL (ref 0.00–0.50)

## 2015-08-03 LAB — I-STAT TROPONIN, ED
TROPONIN I, POC: 0.01 ng/mL (ref 0.00–0.08)
Troponin i, poc: 0.01 ng/mL (ref 0.00–0.08)

## 2015-08-03 LAB — DIGOXIN LEVEL: DIGOXIN LVL: 0.5 ng/mL — AB (ref 0.8–2.0)

## 2015-08-03 LAB — TROPONIN I: Troponin I: <0.03 ng/mL (ref <0.031)

## 2015-08-03 MED ORDER — ASPIRIN 81 MG PO CHEW
324.0000 mg | CHEWABLE_TABLET | Freq: Once | ORAL | Status: AC
  Administered 2015-08-03: 324 mg via ORAL
  Filled 2015-08-03: qty 4

## 2015-08-03 NOTE — Telephone Encounter (Signed)
Pt calls and states dr Ronnald Ramp told him to call if he got short of breath again, he states that he is short of breath and just threw all his lunch up, he does sound short of breath on the ph, per dr Ronnald Ramp pt is to come to ED now, pt states he does not have transportation, he is ask to call 911 now, he is agreeable

## 2015-08-03 NOTE — Assessment & Plan Note (Signed)
Patient with recent admission for CHF, ECHO showed EF of 15%. Right and left heart cath showed clean coronaries at that time, NICM thought to be possibly 2/2 viral myocarditis. Currently on Losartan 12.5 mg daily, Spironolactone 12.5 mg daily, Digoxin 0.125 mg daily, Coreg 3.125 mg bid, and Torsemide 10 mg prn for weight >170 lbs. Weight is down to 160 lbs today, orthostatic on exam. Has had significant issues with over-diuresis in the past ti appears, seems quite sensitive to diuretics.  -Continue medications at low dose.  -Torsemide ONLY when weight is >170.  -Liberalize fluid intake -RTC in 2 days for close follow up

## 2015-08-03 NOTE — Progress Notes (Signed)
Subjective:   Patient ID: Jerome Barnett male   DOB: [birth year] 45 y.o.   MRN: [id number]  HPI: [name] is a 46 y.o. male w/ PMHx of CHF, presents to the clinic today for an acute visit for SOB. Says this started last night, also had a mild cough, and some lightheadedness as well. No fever, chills, nausea. Also felt some mild chest pressure as well. Took Torsemide (he takes this prn for weight > 170 lbs) 10 mg once because he thought his would help his breathing. Diuresed significantly overnight, then this AM, had worsened SOB and some mild chest pressure. Lightheaded in the clinic. Orthostatic vital showed change in systolic from 99991111 to 87 from lying to standing with lightheadedness on standing, then continued with sitting. Improved symptoms when lying down. Somewhat pale on exam, very mild diaphoresis.   Past Medical History  Diagnosis Date  . CHF (congestive heart failure) ([place])    Current Outpatient Prescriptions  Medication Sig Dispense Refill  . acetaminophen (TYLENOL) 325 MG tablet Take 650 mg by mouth every 6 (six) hours as needed for mild pain.    Marland Kitchen aspirin 81 MG chewable tablet Chew 1 tablet (81 mg total) by mouth daily. 30 tablet 3  . atorvastatin (LIPITOR) 80 MG tablet Take 1 tablet (80 mg total) by mouth daily at 6 PM. 30 tablet 3  . carvedilol (COREG) 3.125 MG tablet Take 1 tablet (3.125 mg total) by mouth 2 (two) times daily with a meal. 60 tablet 3  . digoxin (LANOXIN) 0.25 MG tablet Take 0.5 tablets (0.125 mg total) by mouth daily. 30 tablet 3  . losartan (COZAAR) 25 MG tablet Take 0.5 tablets (12.5 mg total) by mouth daily. 15 tablet 3  . spironolactone (ALDACTONE) 25 MG tablet Take 0.5 tablets (12.5 mg total) by mouth daily. 30 tablet 3  . torsemide (DEMADEX) 10 MG tablet If weight increases to 170 or legs start to swell, take 1 pill daily until weight normalizes 30 tablet 1   No current facility-administered medications for this visit.    Review of  Systems: General: Denies fever, chills, diaphoresis, appetite change and fatigue.  Respiratory: Positive for SOB and mild cough. Denies wheezing.   Cardiovascular: Positive for mild chest pressure. Denies palpitations.  Gastrointestinal: Denies nausea, vomiting, abdominal pain, and diarrhea.  Genitourinary: Denies dysuria, increased frequency, and flank pain. Endocrine: Denies hot or cold intolerance, polyuria, and polydipsia. Musculoskeletal: Denies myalgias, back pain, joint swelling, arthralgias and gait problem.  Skin: Denies pallor, rash and wounds.  Neurological: Positive for lightheadedness. Denies dizziness, seizures, syncope, weakness, numbness and headaches.  Psychiatric/Behavioral: Denies mood changes, and sleep disturbances.  Objective:   Physical Exam: Filed Vitals:   08/03/15 0820 08/03/15 0824  BP: 105/75 87/62  Pulse: 77 84  Temp: 97.5 F (36.4 C)   TempSrc: Oral   Resp: 24   Height: 5\' 9"  (1.753 m)   Weight: 160 lb 9.6 oz (72.848 kg)   SpO2: 100% 100%    General: Alert, cooperative, NAD. Mild tachypnea. Slight diaphoresis.  HEENT: PERRL, EOMI. Moist mucus membranes. Neck: Full range of motion without pain, supple, no lymphadenopathy or carotid bruits. NO JVD.  Lungs: Clear to ascultation bilaterally, normal work of respiration, no wheezes, rales, rhonchi Heart: RRR, no murmurs, gallops, or rubs Abdomen: Soft, non-tender, non-distended, BS + Extremities: No cyanosis, clubbing, or edema Neurologic: Alert & oriented x3, cranial nerves II-XII intact, strength grossly intact, sensation intact to light touch   Assessment & Plan:  Please see problem based assessment and plan.

## 2015-08-03 NOTE — ED Provider Notes (Signed)
CSN: [id number]     Arrival date & time 08/03/15  1647 History   First MD Initiated Contact with Patient 08/03/15 2111     Chief Complaint  Patient presents with  . Shortness of Breath     (Consider location/radiation/quality/duration/timing/severity/associated sxs/prior Treatment) HPI  45 year old male presents with chest pressure and dyspnea since last night. Also some lightheadedness. On and off since last night. Was orthostatic when being seen in Internal medicine office this AM. Given 500 cc IVF and felt somewhat better. Pressure still there was well as some dyspnea. No leg edema. Diagnosed with CHF last month, assumed to be viral in nature. Took torsemide last night because he thought the dyspnea might be fluid related. He is currently at his "dry weight".   Past Medical History  Diagnosis Date  . CHF (congestive heart failure) [facility])    Past Surgical History  Procedure Laterality Date  . Cardiac catheterization N/A 07/05/2015    Procedure: Right/Left Heart Cath and Coronary Angiography;  Surgeon: Jolaine Artist, MD;  Location: [facility];  Service: Cardiovascular;  Laterality: N/A;   Family History  Problem Relation Age of Onset  . Hypertension Father   . Hyperlipidemia Father    Social History  Substance Use Topics  . Smoking status: Former Smoker -- 2.00 packs/day for 20 years    Quit date: 03/20/2012  . Smokeless tobacco: Never Used  . Alcohol Use: No    Review of Systems  Constitutional: Negative for fever.  Respiratory: Positive for shortness of breath.   Cardiovascular: Positive for chest pain. Negative for leg swelling.  Gastrointestinal: Positive for vomiting (once today). Negative for abdominal pain.  All other systems reviewed and are negative.     Allergies  Amoxicillin and Lasix  Home Medications   Prior to Admission medications   Medication Sig Start Date End Date Taking? Authorizing Provider  acetaminophen (TYLENOL) 325 MG tablet Take  650 mg by mouth every 6 (six) hours as needed for mild pain.   Yes Historical Provider, MD  aspirin 81 MG chewable tablet Chew 1 tablet (81 mg total) by mouth daily. 07/08/15  Yes Iline Oven, MD  atorvastatin (LIPITOR) 80 MG tablet Take 1 tablet (80 mg total) by mouth daily at 6 PM. 07/08/15  Yes Iline Oven, MD  carvedilol (COREG) 3.125 MG tablet Take 1 tablet (3.125 mg total) by mouth 2 (two) times daily with a meal. 07/08/15  Yes Iline Oven, MD  clindamycin (CLEOCIN) 300 MG capsule Take 300 mg by mouth 3 (three) times daily. Started on 07-27-15   Yes Historical Provider, MD  digoxin (LANOXIN) 0.25 MG tablet Take 0.5 tablets (0.125 mg total) by mouth daily. 07/14/15  Yes Shirley Friar, PA-C  HYDROcodone-acetaminophen (NORCO/VICODIN) 5-325 MG tablet Take 1 tablet by mouth every 6 (six) hours as needed for moderate pain.   Yes Historical Provider, MD  losartan (COZAAR) 25 MG tablet Take 0.5 tablets (12.5 mg total) by mouth daily. 07/20/15  Yes Loleta Chance, MD  spironolactone (ALDACTONE) 25 MG tablet Take 0.5 tablets (12.5 mg total) by mouth daily. 07/08/15  Yes Iline Oven, MD  torsemide (DEMADEX) 10 MG tablet If weight increases to 170 or legs start to swell, take 1 pill daily until weight normalizes 07/20/15  Yes Loleta Chance, MD   BP 104/67 mmHg  Pulse 60  Temp(Src) 98.6 F (37 C) (Oral)  Resp 20  SpO2 100% Physical Exam  Constitutional: He is oriented to  person, place, and time. He appears well-developed and well-nourished.  HENT:  Head: Normocephalic and atraumatic.  Right Ear: External ear normal.  Left Ear: External ear normal.  Nose: Nose normal.  Eyes: Right eye exhibits no discharge. Left eye exhibits no discharge.  Neck: Neck supple. No JVD present.  Cardiovascular: Normal rate, regular rhythm, normal heart sounds and intact distal pulses.   Pulmonary/Chest: Effort normal and breath sounds normal. He has no wheezes. He has no rales.  Abdominal: Soft.  There is no tenderness.  Musculoskeletal: He exhibits no edema.  Neurological: He is alert and oriented to person, place, and time.  Skin: Skin is warm and dry.  Nursing note and vitals reviewed.   ED Course  Procedures (including critical care time) Labs Review Labs Reviewed  DIGOXIN LEVEL - Abnormal; Notable for the following:    Digoxin Level 0.5 (*)    All other components within normal limits  BASIC METABOLIC PANEL  CBC  BRAIN NATRIURETIC PEPTIDE  D-DIMER, QUANTITATIVE (NOT AT [facility])  Randolm Idol, ED  Randolm Idol, ED    Imaging Review Dg Chest 2 View  08/03/2015  CLINICAL DATA:  Chest pain and shortness of breath for 2 days EXAM: CHEST  2 VIEW COMPARISON:  07/13/2015 FINDINGS: Cardiac shadow is stable. The lungs are clear bilaterally. No bony abnormality is seen. The previously seen right PICC line has been removed in the interval. IMPRESSION: No acute abnormality noted. Electronically Signed   By: Inez Catalina M.D.   On: 08/03/2015 18:24   I have personally reviewed and evaluated these images and lab results as part of my medical decision-making.   EKG Interpretation   Date/Time:  Tuesday Aug 03 2015 16:52:34 EDT Ventricular Rate:  74 PR Interval:  124 QRS Duration: 102 QT Interval:  380 QTC Calculation: 421 R Axis:   67 Text Interpretation:  Sinus rhythm with occasional Premature ventricular  complexes Left ventricular hypertrophy with repolarization abnormality  Abnormal ECG nonspecific T waves similar to earlier in the day but changed  from April 2017 Confirmed by Regenia Skeeter MD, [place] [phone]) on 08/03/2015  8:43:38 PM       EKG Interpretation  Date/Time:  Tuesday Aug 03 2015 16:52:34 EDT Ventricular Rate:  74 PR Interval:  124 QRS Duration: 102 QT Interval:  380 QTC Calculation: 421 R Axis:   67 Text Interpretation:  Sinus rhythm with occasional Premature ventricular complexes Left ventricular hypertrophy with repolarization abnormality Abnormal  ECG nonspecific T waves similar to earlier in the day but changed from April 2017 Confirmed by Regenia Skeeter MD, [place] [phone]) on 08/03/2015 8:43:38 PM       MDM   Final diagnoses:  Dyspnea    Patient feels like all of his symptoms have resolved. Mildly orthostatic but he is not symptomatic when standing up. He denies current shortness of breath or chest pain. ECG is nonspecific and appears to have an LVH pattern with nonspecific T waves. Has 2 negative troponins. Last month had a cardiac cath that showed severe CHF but normal coronary arteries. Discussed observation admission versus going home. Patient was to go home given that he feels well now. He has follow-up in the internal medicine outpatient clinic in 2 days. He does not appear to be fluid overloaded. He is not hypoxic or tachycardic and is low risk for PE. At this point I feel he does not have any emergent conditions and can be discharge home with return precautions. Patient family agree.    Sherwood Gambler, MD  08/03/15 2351 

## 2015-08-03 NOTE — Progress Notes (Signed)
Case discussed with Dr. Jones at the time of the visit.  We reviewed the resident's history and exam and pertinent patient test results.  I agree with the assessment, diagnosis, and plan of care documented in the resident's note. 

## 2015-08-03 NOTE — ED Notes (Signed)
He c/o CP and SOB since last night. He was seen in the internal medicine clinic downstairs this morning and they thought he was dehydrated, gave him IV fluids, and discharged him home. He reports since he went home the SOB returned and he began vomiting and having sweats. He is alert and breathing easily. He denies any pain now

## 2015-08-03 NOTE — Assessment & Plan Note (Addendum)
Most likely cause of patient's symptoms. SBP 99991111 systolic when standing. No significant increase in pulse, however, patient is on BB at baseline. Given 500 cc IVF with significant improvement in symptoms. Labs appear normal. Weight down, 160 lbs today. Not orthostatic after IVF.  -RTC in 2 days for follow up -Continue home medications. HOLD torsemide unless weight is up > 170.

## 2015-08-03 NOTE — Assessment & Plan Note (Addendum)
Patient with SOB since last night. Says he took his Torsemide since he was feeling SOB. Felt like it helped somewhat, however, this AM, he felt worsening SOB, dizziness, and mild chest pressure. Quite orthostatic on exam, No signs of volume overload. Labs normal, SpO2 100% on RA. EKG with no obvious ischemic changes, only new lateral TWI, not quite as significant as previous. Troponin normal. Previous right and left heart cath significant for NICM with clean coronaries. Think this is related to hypovolemia. Based on clinical findings and history, do NOT suspect PE. Given 500 cc IVF with significant improvement in symptoms. Not orthostatic after IVF.  -RTC in 2 days for follow up.  -Torsemide only for weight >170 lbs as specified by CHF clinic.

## 2015-08-03 NOTE — Patient Instructions (Signed)
1. Please make a follow up appointment for 2 days.   2. Please take all medications as previously prescribed.  3. If you have worsening of your symptoms or new symptoms arise, please call the clinic [phone]), or go to the ER immediately if symptoms are severe.  Orthostatic Hypotension Orthostatic hypotension is a sudden drop in blood pressure. It happens when you quickly stand up from a seated or lying position. You may feel dizzy or light-headed. This can last for just a few seconds or for up to a few minutes. It is usually not a serious problem. However, if this happens frequently or gets worse, it can be a sign of something more serious. CAUSES  Different things can cause orthostatic hypotension, including:   Loss of body fluids (dehydration).  Medicines that lower blood pressure.  Sudden changes in posture, such as standing up quickly after you have been sitting or lying down.  Taking too much of your medicine. SIGNS AND SYMPTOMS   Light-headedness or dizziness.   Fainting or near-fainting.   A fast heart rate.   Weakness.   Feeling tired (fatigue).  DIAGNOSIS  Your health care provider may do several things to help diagnose your condition and identify the cause. These may include:   Taking a medical history and doing a physical exam.  Checking your blood pressure. Your health care provider will check your blood pressure when you are:  Lying down.  Sitting.  Standing.  Using tilt table testing. In this test, you lie down on a table that moves from a lying position to a standing position. You will be strapped onto the table. This test monitors your blood pressure and heart rate when you are in different positions. TREATMENT  Treatment will vary depending on the cause. Possible treatments include:   Changing the dosage of your medicines.  Wearing compression stockings on your lower legs.  Standing up slowly after sitting or lying down.  Eating more  salt.  Eating frequent, small meals.  In some cases, getting IV fluids.  Taking medicine to enhance fluid retention. HOME CARE INSTRUCTIONS  Only take over-the-counter or prescription medicines as directed by your health care provider.  Follow your health care provider's instructions for changing the dosage of your current medicines.  Do not stop or adjust your medicine on your own.  Stand up slowly after sitting or lying down. This allows your body to adjust to the different position.  Wear compression stockings as directed.  Eat extra salt as directed.  Do not add extra salt to your diet unless directed to by your health care provider.  Eat frequent, small meals.  Avoid standing suddenly after eating.  Avoid hot showers or excessive heat as directed by your health care provider.  Keep all follow-up appointments. SEEK MEDICAL CARE IF:  You continue to feel dizzy or light-headed after standing.  You feel groggy or confused.  You feel cold, clammy, or sick to your stomach (nauseous).  You have blurred vision.  You feel short of breath. SEEK IMMEDIATE MEDICAL CARE IF:   You faint after standing.  You have chest pain.  You have difficulty breathing.   You lose feeling or movement in your arms or legs.   You have slurred speech or difficulty talking, or you are unable to talk.  MAKE SURE YOU:   Understand these instructions.  Will watch your condition.  Will get help right away if you are not doing well or get worse.   This  information is not intended to replace advice given to you by your health care provider. Make sure you discuss any questions you have with your health care provider.   Document Released: 02/24/2002 Document Revised: 03/11/2013 Document Reviewed: 12/27/2012 Elsevier Interactive Patient Education [employer].

## 2015-08-05 ENCOUNTER — Encounter: Payer: Self-pay | Admitting: Internal Medicine

## 2015-08-05 ENCOUNTER — Ambulatory Visit (INDEPENDENT_AMBULATORY_CARE_PROVIDER_SITE_OTHER): Payer: Self-pay | Admitting: Internal Medicine

## 2015-08-05 VITALS — BP 105/74 | HR 86 | Temp 97.7°F | Wt 164.4 lb

## 2015-08-05 DIAGNOSIS — I509 Heart failure, unspecified: Secondary | ICD-10-CM

## 2015-08-05 DIAGNOSIS — I5020 Unspecified systolic (congestive) heart failure: Secondary | ICD-10-CM

## 2015-08-05 DIAGNOSIS — R0600 Dyspnea, unspecified: Secondary | ICD-10-CM

## 2015-08-05 DIAGNOSIS — I951 Orthostatic hypotension: Secondary | ICD-10-CM

## 2015-08-05 MED ORDER — PANTOPRAZOLE SODIUM 20 MG PO TBEC: 20.0000 mg | DELAYED_RELEASE_TABLET | Freq: Every day | ORAL | Status: DC

## 2015-08-05 MED FILL — PANTOPRAZOLE SOD DR 20 MG T: 20 | 30 days supply | Qty: 30 | Fill #0

## 2015-08-05 NOTE — Patient Instructions (Signed)
1. Please return for follow up early next week (Tuesday or Wednesday).   2. Please take all medications as previously prescribed with the following changes:  HOLD your Spironolactone.  Start taking Protonix 20 mg daily  3. If you have worsening of your symptoms or new symptoms arise, please call the clinic [phone]), or go to the ER immediately if symptoms are severe.  You have done a great job in taking all your medications. Please continue to do this.

## 2015-08-05 NOTE — Progress Notes (Signed)
   Subjective:   Patient ID: Jerome Barnett male   DOB: June 22, 1970 45 y.o.   MRN: [id number]  HPI: Mr. [name] is a 45 y.o. male w/ PMHx of CHF, presents to the clinic today for a follow up visit for SOB and orthostatic hypotension. Today, patient states he is doing much better, but is still having lightheadedness with standing, generalized weakness, and some SOB w/ his lightheadedness. No further chest pressure. No PND, orthopnea, or palpitations.   Past Medical History  Diagnosis Date  . CHF (congestive heart failure) ([place])    Current Outpatient Prescriptions  Medication Sig Dispense Refill  . acetaminophen (TYLENOL) 325 MG tablet Take 650 mg by mouth every 6 (six) hours as needed for mild pain.    Marland Kitchen aspirin 81 MG chewable tablet Chew 1 tablet (81 mg total) by mouth daily. 30 tablet 3  . atorvastatin (LIPITOR) 80 MG tablet Take 1 tablet (80 mg total) by mouth daily at 6 PM. 30 tablet 3  . carvedilol (COREG) 3.125 MG tablet Take 1 tablet (3.125 mg total) by mouth 2 (two) times daily with a meal. 60 tablet 3  . clindamycin (CLEOCIN) 300 MG capsule Take 300 mg by mouth 3 (three) times daily. Started on 07-27-15    . digoxin (LANOXIN) 0.25 MG tablet Take 0.5 tablets (0.125 mg total) by mouth daily. 30 tablet 3  . HYDROcodone-acetaminophen (NORCO/VICODIN) 5-325 MG tablet Take 1 tablet by mouth every 6 (six) hours as needed for moderate pain.    Marland Kitchen losartan (COZAAR) 25 MG tablet Take 0.5 tablets (12.5 mg total) by mouth daily. 15 tablet 3  . spironolactone (ALDACTONE) 25 MG tablet Take 0.5 tablets (12.5 mg total) by mouth daily. 30 tablet 3  . torsemide (DEMADEX) 10 MG tablet If weight increases to 170 or legs start to swell, take 1 pill daily until weight normalizes 30 tablet 1   No current facility-administered medications for this visit.    Review of Systems  General: Positive for generalized fatigue. Denies fever, diaphoresis, appetite change.  Respiratory: Positive for  SOB. Denies cough, and wheezing.   Cardiovascular: Denies chest pain and palpitations.  Gastrointestinal: Denies nausea, vomiting, abdominal pain, and diarrhea Musculoskeletal: Denies myalgias, arthralgias, back pain, and gait problem.  Neurological: Positive for lightheadedness. Denies dizziness, syncope, weakness, and headaches.  Psychiatric/Behavioral: Denies mood changes, sleep disturbance, and agitation.   Objective:   Physical Exam: Filed Vitals:   08/05/15 0830  BP: 105/74  Pulse: 86  Temp: 97.7 F (36.5 C)  TempSrc: Oral  Weight: 164 lb 6.4 oz (74.571 kg)  SpO2: 100%    General: Alert, cooperative, NAD. Very mild tachypnea on exam.  HEENT: PERRL, EOMI. Moist mucus membranes. Neck: Full range of motion without pain, supple, no lymphadenopathy or carotid bruits. No JVD.  Lungs: Clear to ascultation bilaterally, normal work of respiration, no wheezes, rales, rhonchi Heart: RRR, no murmurs, gallops, or rubs Abdomen: Soft, non-tender, non-distended, BS + Extremities: No cyanosis, clubbing, or edema Neurologic: Alert & oriented x3, cranial nerves II-XII intact, strength grossly intact, sensation intact to light touch   Assessment & Plan:   Please see problem based assessment and plan.

## 2015-08-06 NOTE — Assessment & Plan Note (Signed)
Not as significant, feel this was mostly related to volume depletion and also likely due to anxiety given his new clinical state. I think having severe heart failure has come as quite a shock and any variations in his health are very prone to cause anxiety.  -Supportive care for now -Continue CHF medications as described -Follow up in 1 week

## 2015-08-06 NOTE — Assessment & Plan Note (Signed)
Blood pressure still soft this AM, suspect he may be slightly orthostatic, although he states he feels much better. Still with some mild lightheadedness with standing, is most likely slightly volume depleted, however, given his EF of 10-15%, think this is somewhat reasonable at this time. I do not think he requires any further diuresis at this time.  -Hold Spironolactone for now -Continue other CHF medications

## 2015-08-06 NOTE — Assessment & Plan Note (Signed)
Patient still likely a bit volume depleted, still mildly hypotensive and symptomatic with standing. This has him worried. Lungs clear, no JVD, no edema. Weight at baseline, 164 lbs today.  -Hold Spironolactone for now. Hopefully this can be restarted soon -Continue Losartan 12.5 mg daily, Coreg 3.125 mg bid, Digoxin 0.125 mg, Lipitor, ASA -Torsemide 10 mg prn for weight > 170 lbs.  -Follow up in 1 week. Very low threshold to restart Spironolactone at that time

## 2015-08-06 NOTE — Progress Notes (Signed)
Medicine attending: Medical history, presenting problems, physical findings, and medications, reviewed with resident physician Dr Eden Jones on the day of the patient visit and I concur with his evaluation and management plan. 

## 2015-08-10 ENCOUNTER — Ambulatory Visit (INDEPENDENT_AMBULATORY_CARE_PROVIDER_SITE_OTHER): Payer: Self-pay | Admitting: Internal Medicine

## 2015-08-10 VITALS — BP 129/86 | HR 76 | Temp 97.5°F | Ht 68.0 in | Wt 166.1 lb

## 2015-08-10 DIAGNOSIS — I5020 Unspecified systolic (congestive) heart failure: Secondary | ICD-10-CM

## 2015-08-10 DIAGNOSIS — R0600 Dyspnea, unspecified: Secondary | ICD-10-CM

## 2015-08-10 DIAGNOSIS — I951 Orthostatic hypotension: Secondary | ICD-10-CM

## 2015-08-10 DIAGNOSIS — I509 Heart failure, unspecified: Secondary | ICD-10-CM

## 2015-08-10 MED ORDER — LOSARTAN POTASSIUM 25 MG PO TABS: 12.5000 mg | ORAL_TABLET | Freq: Every day | ORAL | Status: DC

## 2015-08-10 MED ORDER — CARVEDILOL 3.125 MG PO TABS: 3.1250 mg | ORAL_TABLET | Freq: Two times a day (BID) | ORAL | Status: DC

## 2015-08-10 MED ORDER — ATORVASTATIN CALCIUM 80 MG PO TABS: 80.0000 mg | ORAL_TABLET | Freq: Every day | ORAL | Status: DC

## 2015-08-10 MED FILL — LOSARTAN POTASSIUM 25 MG TA: 25 | 34 days supply | Qty: 17 | Fill #1

## 2015-08-10 MED FILL — CARVEDILOL 3.125 MG TABLET: 3.125 | 30 days supply | Qty: 60 | Fill #0

## 2015-08-10 MED FILL — ATORVASTATIN 80 MG TABLET: 80 | 30 days supply | Qty: 30 | Fill #0

## 2015-08-10 NOTE — Progress Notes (Signed)
   Subjective:   Patient ID: Jerome Barnett male   DOB: June 12, 1970 45 y.o.   MRN: [id number]  HPI: [name] is a 45 y.o. male w/ PMHx of CHF, presents to the clinic today for a follow up visit for SOB and orthostatic hypotension. During his last visit, patient was still quite symptomatic, having lightheadedness with standing and some SOB involving this. His Spironolactone was discontinued at that time. Today, he says he feels much better, still somewhat symptomatic, has to stand slowly due to mild lightheadedness, but not experiencing quite as significant symptoms. No chest discomfort, no further SOB unless he exerts himself for several minutes. Weight 166, close to baseline.   Past Medical History  Diagnosis Date  . CHF (congestive heart failure) ([place])    Current Outpatient Prescriptions  Medication Sig Dispense Refill  . acetaminophen (TYLENOL) 325 MG tablet Take 650 mg by mouth every 6 (six) hours as needed for mild pain.    Marland Kitchen aspirin 81 MG chewable tablet Chew 1 tablet (81 mg total) by mouth daily. 30 tablet 3  . atorvastatin (LIPITOR) 80 MG tablet Take 1 tablet (80 mg total) by mouth daily at 6 PM. 30 tablet 3  . carvedilol (COREG) 3.125 MG tablet Take 1 tablet (3.125 mg total) by mouth 2 (two) times daily with a meal. 60 tablet 3  . clindamycin (CLEOCIN) 300 MG capsule Take 300 mg by mouth 3 (three) times daily. Started on 07-27-15    . digoxin (LANOXIN) 0.25 MG tablet Take 0.5 tablets (0.125 mg total) by mouth daily. 30 tablet 3  . HYDROcodone-acetaminophen (NORCO/VICODIN) 5-325 MG tablet Take 1 tablet by mouth every 6 (six) hours as needed for moderate pain.    Marland Kitchen losartan (COZAAR) 25 MG tablet Take 0.5 tablets (12.5 mg total) by mouth daily. 15 tablet 3  . pantoprazole (PROTONIX) 20 MG tablet Take 1 tablet (20 mg total) by mouth daily. 30 tablet 2  . torsemide (DEMADEX) 10 MG tablet If weight increases to 170 or legs start to swell, take 1 pill daily until weight  normalizes 30 tablet 1   No current facility-administered medications for this visit.    Review of Systems  General: Denies fever, diaphoresis, appetite change, and fatigue.  Respiratory: Denies SOB, cough, and wheezing.   Cardiovascular: Denies chest pain and palpitations.  Gastrointestinal: Denies nausea, vomiting, abdominal pain, and diarrhea Musculoskeletal: Denies myalgias, arthralgias, back pain, and gait problem.  Neurological: Positive for lightheadedness. Denies dizziness, syncope, weakness, and headaches.  Psychiatric/Behavioral: Denies mood changes, sleep disturbance, and agitation.    Objective:   Physical Exam: Filed Vitals:   08/10/15 0954 08/10/15 1008  BP: 135/93 129/86  Pulse: 72 76  Temp: 97.5 F (36.4 C)   TempSrc: Oral   Height: 5\' 8"  (1.727 m)   Weight: 166 lb 1.6 oz (75.342 kg)   SpO2: 100%     General: Alert, cooperative, NAD.  HEENT: PERRL, EOMI. Moist mucus membranes. Neck: Full range of motion without pain, supple, no lymphadenopathy or carotid bruits. No JVD.  Lungs: Clear to ascultation bilaterally, normal work of respiration, no wheezes, rales, rhonchi Heart: RRR, no murmurs, gallops, or rubs Abdomen: Soft, non-tender, non-distended, BS + Extremities: No cyanosis, clubbing, or edema Neurologic: Alert & oriented x3, cranial nerves II-XII intact, strength grossly intact, sensation intact to light touch   Assessment & Plan:   Please see problem based assessment and plan.

## 2015-08-10 NOTE — Patient Instructions (Signed)
1. Please follow up in the clinic in 4 weeks.   2. Please take all medications as previously prescribed with the following changes:  Continue to hold Spironolactone for now. Will likely need to restart this in the very near future.   Please follow up with CHF clinic.   3. If you have worsening of your symptoms or new symptoms arise, please call the clinic [phone] or go to the ER immediately if symptoms are severe.  You have done a great job in taking all your medications. Please continue to do this.

## 2015-08-11 NOTE — Assessment & Plan Note (Signed)
Patient seems to be quite sensitive to diuretics. Currently euvolemic if not slightly hypovolemic given his continued mild symptoms with standing. Lungs clear, no JVD, no edema. Weight 166 lbs, 165 lbs during his last visit. Feel like this is a reasonable baseline for him, given a recent clinic visit where he was 160 lbs and required 500 cc IVF due to significant symptoms of orthostasis. Held Spironolactone at his last clinic visit due to continued symptoms, says he is feeling much better without this medication. BP with systolic in the [id number] currently.  -Continue to hold Spironolactone for now -Continue Cozaar 12.5 mg daily, Coreg 3.125 mg bid, and digoxin 0.125 mg daily -Follow up in CHF clinic next week.

## 2015-08-11 NOTE — Progress Notes (Signed)
Case discussed with Dr. Jones at the time of the visit.  We reviewed the resident's history and exam and pertinent patient test results.  I agree with the assessment, diagnosis, and plan of care documented in the resident's note. 

## 2015-08-11 NOTE — Assessment & Plan Note (Signed)
BP improved today, systolic [id number] initially, most likely related to walking from the parking lot. Decreased to 120's by the ned of his appointment. Says he is feeling much better but still has some lightheadedness with standing. Says it is much better since stopping the Spironolactone however. Weight 166 lbs.  -Continue to hold Spirono for now -Follow up with CHF clinic next week

## 2015-08-11 NOTE — Assessment & Plan Note (Signed)
Resolved

## 2015-08-16 NOTE — Progress Notes (Signed)
Patient ID: Jerome Barnett, male   DOB: [birth year], 45 y.o.   MRN: [id number]   ADVANCED HF CLINIC CONSULT NOTE  Primary HF: Dr. Haroldine Laws   HPI:  Jerome Barnett is a 45 year old male with h/o HL, HTN and GERD who was admitted in 4/17 with acute HF. EF 15%.  Underwent cath in 4/17 normal cors with low filling pressures and normal cardiac output. Procedure complicated by radial artery spasm requiring multiple rounds of verapamil and NTG. Patient then developed shock and was supported with norepi and milrinone transiently. Was discharged home and then readmitted several days later with hypotension in setting of volume depletion due to severe restriction of po intake.    Seen in ED 08/03/15 and found to be orthostatic. Symptoms improved with 500 cc of IVF.   He presents today for regular follow up.  At last visit Losartan increased and U/S ordered for RUE swelling which showed superficial clot. Has no further complaints with his RUE. Has been taken off spironolactone recently with orthostasis, feels a lot better, but blood pressure has been creeping. Weight at home 163-164. Still gets occasionally dizzy when he stands up, but not as bad. Continues to walk dogs 15 mins per day. Has started back to work (resumed full time yesterday).  Has been feeling better. Hasn't taken a torsemide for several weeks.  Drinking one bottle of water a day and then 3-4 glasses of other fluids (Coffee, Tea, Juice).   Echo 07/04/15 LVEF 10-15%, normal RV  RHC/LHC  RA = 2 RV = 28/0/4 PA = 28/11 (18) PCW = 5 Fick cardiac output/index = 4.3/2.3 PVR = 3.0 WU FA sat = 94% PA sat = 60%, 63% Assessment: 1. Normal coronary arteries 2. Low filling pressures with normal cardiac output 3. Severe NICM with EF 10% by echo 3. Development of severe hypotension and shock in cath lab due to vasodilators to treat radial artery spasm    Current Outpatient Prescriptions  Medication Sig Dispense Refill  . acetaminophen  (TYLENOL) 325 MG tablet Take 650 mg by mouth every 6 (six) hours as needed for mild pain.    Marland Kitchen aspirin 81 MG chewable tablet Chew 1 tablet (81 mg total) by mouth daily. 30 tablet 3  . atorvastatin (LIPITOR) 80 MG tablet Take 1 tablet (80 mg total) by mouth daily at 6 PM. 30 tablet 5  . carvedilol (COREG) 3.125 MG tablet Take 1 tablet (3.125 mg total) by mouth 2 (two) times daily with a meal. 60 tablet 5  . digoxin (LANOXIN) 0.25 MG tablet Take 0.5 tablets (0.125 mg total) by mouth daily. 30 tablet 3  . losartan (COZAAR) 25 MG tablet Take 0.5 tablets (12.5 mg total) by mouth daily. 15 tablet 5  . pantoprazole (PROTONIX) 20 MG tablet Take 1 tablet (20 mg total) by mouth daily. 30 tablet 2  . torsemide (DEMADEX) 10 MG tablet If weight increases to 170 or legs start to swell, take 1 pill daily until weight normalizes 30 tablet 1   No current facility-administered medications for this encounter.    Allergies  Allergen Reactions  . Amoxicillin Swelling    Eye swelling  . Lasix [Furosemide] Swelling    Eye swelling      Social History   Social History  . Marital Status: Married    Spouse Name: N/A  . Number of Children: N/A  . Years of Education: N/A   Occupational History  . Not on file.   Social History Main Topics  .  Smoking status: Former Smoker -- 2.00 packs/day for 20 years    Quit date: 03/20/2012  . Smokeless tobacco: Never Used  . Alcohol Use: No  . Drug Use: No  . Sexual Activity: Not on file   Other Topics Concern  . Not on file   Social History Narrative   Patient is married, no children.   Has pets   Works full time at [employer]       Family History  Problem Relation Age of Onset  . Hypertension Father   . Hyperlipidemia Father     Danley Danker Vitals:   08/17/15 0901  BP: 134/92  Pulse: 78  Weight: 165 lb 12.8 oz (75.206 kg)  SpO2: 100%   Wt Readings from Last 3 Encounters:  08/17/15 165 lb 12.8 oz (75.206 kg)  08/10/15 166 lb 1.6 oz (75.342 kg)    08/05/15 164 lb 6.4 oz (74.571 kg)     PHYSICAL EXAM: General:  Well appearing. NAD HEENT: normal Neck: supple. no JVD. Carotids 2+ bilat; no bruits. No thyromegaly or nodule noted.  Cor: PMI laterally displaced. RRR. No M/G/R Lungs: CTAB, normal effort.  Abdomen: soft, NT, ND, no HSM. No bruits or masses. +BS  Extremities: no cyanosis, clubbing, rash, edema.  Neuro: alert & oriented x 3, cranial nerves grossly intact. moves all 4 extremities w/o difficulty. Affect pleasant.   ASSESSMENT & PLAN: 1. Acute systolic heart failure - cath 4/17 with normal coronaries. EF 15%. ? Viral CM --NYHA II. Volume status stable on exam.  -- Continue losartan 12.5 mg daily. Anticipate increasing at next visit.  -- Continue coreg 3.125 mg BID -- Continue digoxin 0.125 mcg daily. Level stable 2 weeks ago.  -- Continue torsemide 10 mg ONLY AS NEEDED.  -- Add back spiro at 12.5 mg daily.   If he gets dizzy, especially with decrease in weight, instructed to hold a day or two and make sure he is drinking enough fluid.  2. RUE swelling.  -- Improved, US showed superficial clot. Treating with heating pad and rest. 3. HLD -- Cont atorvastatin 80 mg.  Per PCP.   Add back [place] as above.  Will recheck his labs when he returns in two weeks.  Instructed to call with any worsening dizziness or SOB.   He would like to observe Ramadan, which would mean fasting from dusk to dawn daily.  This may be OK as long as he takes his spironolactone in the evening. Will discuss further with MD.   Shirley Friar, PA-C 9:04 AM

## 2015-08-17 ENCOUNTER — Encounter (HOSPITAL_COMMUNITY): Payer: Self-pay

## 2015-08-17 ENCOUNTER — Ambulatory Visit (HOSPITAL_COMMUNITY)
Admission: RE | Admit: 2015-08-17 | Discharge: 2015-08-17 | Disposition: A | Discharge status: Home / Self Care | Payer: Self-pay | Source: Ambulatory Visit | Attending: Cardiology | Admitting: Cardiology

## 2015-08-17 ENCOUNTER — Ambulatory Visit: Payer: Self-pay | Admitting: Internal Medicine

## 2015-08-17 VITALS — BP 134/92 | HR 78 | Wt 165.8 lb

## 2015-08-17 DIAGNOSIS — I5020 Unspecified systolic (congestive) heart failure: Secondary | ICD-10-CM

## 2015-08-17 DIAGNOSIS — E785 Hyperlipidemia, unspecified: Secondary | ICD-10-CM | POA: Insufficient documentation

## 2015-08-17 DIAGNOSIS — Z8249 Family history of ischemic heart disease and other diseases of the circulatory system: Secondary | ICD-10-CM | POA: Insufficient documentation

## 2015-08-17 DIAGNOSIS — I951 Orthostatic hypotension: Secondary | ICD-10-CM

## 2015-08-17 DIAGNOSIS — I509 Heart failure, unspecified: Secondary | ICD-10-CM

## 2015-08-17 DIAGNOSIS — Z87891 Personal history of nicotine dependence: Secondary | ICD-10-CM | POA: Insufficient documentation

## 2015-08-17 DIAGNOSIS — Z7982 Long term (current) use of aspirin: Secondary | ICD-10-CM | POA: Insufficient documentation

## 2015-08-17 DIAGNOSIS — K219 Gastro-esophageal reflux disease without esophagitis: Secondary | ICD-10-CM | POA: Insufficient documentation

## 2015-08-17 DIAGNOSIS — M7989 Other specified soft tissue disorders: Secondary | ICD-10-CM | POA: Insufficient documentation

## 2015-08-17 DIAGNOSIS — Z888 Allergy status to other drugs, medicaments and biological substances status: Secondary | ICD-10-CM | POA: Insufficient documentation

## 2015-08-17 DIAGNOSIS — I110 Hypertensive heart disease with heart failure: Secondary | ICD-10-CM | POA: Insufficient documentation

## 2015-08-17 DIAGNOSIS — Z86718 Personal history of other venous thrombosis and embolism: Secondary | ICD-10-CM | POA: Insufficient documentation

## 2015-08-17 DIAGNOSIS — Z79899 Other long term (current) drug therapy: Secondary | ICD-10-CM | POA: Insufficient documentation

## 2015-08-17 DIAGNOSIS — I5021 Acute systolic (congestive) heart failure: Secondary | ICD-10-CM | POA: Insufficient documentation

## 2015-08-17 MED ORDER — SPIRONOLACTONE 25 MG PO TABS: 12.5000 mg | ORAL_TABLET | Freq: Every day | ORAL | Status: DC

## 2015-08-17 NOTE — Patient Instructions (Signed)
START Spironolactone 12.5 mg ( one half tab) daily  Your physician recommends that you schedule a follow-up appointment in: 2 weeks hospital   Do the following things EVERYDAY: 1) Weigh yourself in the morning before breakfast. Write it down and keep it in a log. 2) Take your medicines as prescribed 3) Eat low salt foods-Limit salt (sodium) to 2000 mg per day.  4) Stay as active as you can everyday 5) Limit all fluids for the day to less than 2 liters 6)

## 2015-08-26 MED FILL — SPIRONOLACTONE 25 MG TABLET: 25 | 34 days supply | Qty: 17 | Fill #1

## 2015-08-31 NOTE — Progress Notes (Signed)
Patient ID: Jerome Barnett, male   DOB: April 14, 1970, 45 y.o.   MRN: [id number]    ADVANCED HF CLINIC CONSULT NOTE  Primary HF: Dr. Haroldine Laws   HPI:  Jerome Barnett is a 45 year old male with h/o HL, HTN and GERD who was admitted in 4/17 with acute HF. EF 15%.  Underwent cath in 4/17 normal cors with low filling pressures and normal cardiac output. Procedure complicated by radial artery spasm requiring multiple rounds of verapamil and NTG. Patient then developed shock and was supported with norepi and milrinone transiently. Was discharged home and then readmitted several days later with hypotension in setting of volume depletion due to severe restriction of po intake.    Seen in ED 08/03/15 and found to be orthostatic. Symptoms improved with 500 cc of IVF.   He presents today for regular follow up.  At last visit added back on [place]. Pt also requested to fast for Ramadan. Has been doing well even with fasting.  Felt bad one evening after breaking the fast in the evening, but thinks he gorged himself that evening. Weight down 4 lbs since last visit.  Breathing is OK, no SOB with walking around and has been working outside with no difficulty. No further dizziness. Working full time. Hasn't needed any torsemide, being sure he stays hydrated despite fast ( 2 bottles in morning and then drinking in the evenings when breaking the fast)    Echo 07/04/15 LVEF 10-15%, normal RV  RHC/LHC  RA = 2 RV = 28/0/4 PA = 28/11 (18) PCW = 5 Fick cardiac output/index = 4.3/2.3 PVR = 3.0 WU FA sat = 94% PA sat = 60%, 63% Assessment: 1. Normal coronary arteries 2. Low filling pressures with normal cardiac output 3. Severe NICM with EF 10% by echo 3. Development of severe hypotension and shock in cath lab due to vasodilators to treat radial artery spasm    Current Outpatient Prescriptions  Medication Sig Dispense Refill  . acetaminophen (TYLENOL) 325 MG tablet Take 650 mg by mouth every 6 (six) hours  as needed for mild pain.    Marland Kitchen aspirin 81 MG chewable tablet Chew 1 tablet (81 mg total) by mouth daily. 30 tablet 3  . atorvastatin (LIPITOR) 80 MG tablet Take 1 tablet (80 mg total) by mouth daily at 6 PM. 30 tablet 5  . carvedilol (COREG) 3.125 MG tablet Take 1 tablet (3.125 mg total) by mouth 2 (two) times daily with a meal. 60 tablet 5  . digoxin (LANOXIN) 0.25 MG tablet Take 0.5 tablets (0.125 mg total) by mouth daily. 30 tablet 3  . losartan (COZAAR) 25 MG tablet Take 0.5 tablets (12.5 mg total) by mouth daily. 15 tablet 5  . pantoprazole (PROTONIX) 20 MG tablet Take 1 tablet (20 mg total) by mouth daily. 30 tablet 2  . spironolactone (ALDACTONE) 25 MG tablet Take 0.5 tablets (12.5 mg total) by mouth daily. 15 tablet 6  . torsemide (DEMADEX) 10 MG tablet If weight increases to 170 or legs start to swell, take 1 pill daily until weight normalizes 30 tablet 1   No current facility-administered medications for this encounter.    Allergies  Allergen Reactions  . Amoxicillin Swelling    Eye swelling  . Lasix [Furosemide] Swelling    Eye swelling      Social History   Social History  . Marital Status: Married    Spouse Name: N/A  . Number of Children: N/A  . Years of Education: N/A  Occupational History  . Not on file.   Social History Main Topics  . Smoking status: Former Smoker -- 2.00 packs/day for 20 years    Quit date: 03/20/2012  . Smokeless tobacco: Never Used  . Alcohol Use: No  . Drug Use: No  . Sexual Activity: Not on file   Other Topics Concern  . Not on file   Social History Narrative   Patient is married, no children.   Has pets   Works full time at [employer]       Family History  Problem Relation Age of Onset  . Hypertension Father   . Hyperlipidemia Father     Danley Danker Vitals:   09/01/15 1020  BP: 130/80  Pulse: 64  Weight: 161 lb 9.6 oz (73.301 kg)  SpO2: 98%   Wt Readings from Last 3 Encounters:  09/01/15 161 lb 9.6 oz (73.301 kg)    08/17/15 165 lb 12.8 oz (75.206 kg)  08/10/15 166 lb 1.6 oz (75.342 kg)     PHYSICAL EXAM: General:  Well appearing. NAD HEENT: normal Neck: supple. no JVD. Carotids 2+ bilat; no bruits. No thyromegaly or nodule noted.  Cor: PMI laterally displaced. RRR. No M/G/R Lungs: CTAB, normal effort.  Abdomen: soft, NT, ND, no HSM. No bruits or masses. +BS  Extremities: no cyanosis, clubbing, rash, edema.  Neuro: alert & oriented x 3, cranial nerves grossly intact. moves all 4 extremities w/o difficulty. Affect pleasant.   ASSESSMENT & PLAN: 1. Acute systolic heart failure - cath 4/17 with normal coronaries. EF 15%. ? Viral CM --NYHA II. Volume status stable on exam.  -- Continue losartan 12.5 mg daily. Anticipate increasing at next visit, will not this visit while he is still fasting.   -- Continue coreg 3.125 mg BID -- Continue digoxin 0.125 mcg daily. Level stable last check, will follow next visit.  -- Continue torsemide 10 mg ONLY AS NEEDED.  -- Continue spiro 12.5 mg qhs with Ramadan.    2. RUE swelling.  -- Improved, US showed superficial clot. Treating with heating pad and rest. 3. HLD -- Cont atorvastatin 80 mg.  Per PCP.   Pt is observing Ramadan and on low doses of good medicines.  Will not risk upsetting the balance he has right now with his meds.  Will see back in 4 weeks, after Ramadan is over for med titration.    Shirley Friar, PA-C 10:24 AM

## 2015-09-01 ENCOUNTER — Ambulatory Visit (HOSPITAL_COMMUNITY)
Admission: RE | Admit: 2015-09-01 | Discharge: 2015-09-01 | Disposition: A | Discharge status: Home / Self Care | Payer: Self-pay | Source: Ambulatory Visit | Attending: Internal Medicine | Admitting: Internal Medicine

## 2015-09-01 VITALS — BP 130/80 | HR 64 | Wt 161.6 lb

## 2015-09-01 DIAGNOSIS — Z7982 Long term (current) use of aspirin: Secondary | ICD-10-CM | POA: Insufficient documentation

## 2015-09-01 DIAGNOSIS — I5021 Acute systolic (congestive) heart failure: Secondary | ICD-10-CM | POA: Insufficient documentation

## 2015-09-01 DIAGNOSIS — Z8249 Family history of ischemic heart disease and other diseases of the circulatory system: Secondary | ICD-10-CM | POA: Insufficient documentation

## 2015-09-01 DIAGNOSIS — I110 Hypertensive heart disease with heart failure: Secondary | ICD-10-CM | POA: Insufficient documentation

## 2015-09-01 DIAGNOSIS — Z888 Allergy status to other drugs, medicaments and biological substances status: Secondary | ICD-10-CM | POA: Insufficient documentation

## 2015-09-01 DIAGNOSIS — Z79899 Other long term (current) drug therapy: Secondary | ICD-10-CM | POA: Insufficient documentation

## 2015-09-01 DIAGNOSIS — Z87891 Personal history of nicotine dependence: Secondary | ICD-10-CM | POA: Insufficient documentation

## 2015-09-01 DIAGNOSIS — M7989 Other specified soft tissue disorders: Secondary | ICD-10-CM | POA: Insufficient documentation

## 2015-09-01 DIAGNOSIS — I509 Heart failure, unspecified: Secondary | ICD-10-CM

## 2015-09-01 DIAGNOSIS — Z880 Allergy status to penicillin: Secondary | ICD-10-CM | POA: Insufficient documentation

## 2015-09-01 DIAGNOSIS — E785 Hyperlipidemia, unspecified: Secondary | ICD-10-CM | POA: Insufficient documentation

## 2015-09-01 DIAGNOSIS — I5020 Unspecified systolic (congestive) heart failure: Secondary | ICD-10-CM

## 2015-09-01 LAB — BASIC METABOLIC PANEL
ANION GAP: 6 (ref 5–15)
BUN: 17 mg/dL (ref 6–20)
CHLORIDE: 105 mmol/L (ref 101–111)
CO2: 26 mmol/L (ref 22–32)
CREATININE: 1 mg/dL (ref 0.61–1.24)
Calcium: 9.3 mg/dL (ref 8.9–10.3)
GFR calc non Af Amer: >60 mL/min (ref >60)
Glucose, Bld: 133 mg/dL — ABNORMAL HIGH (ref 65–99)
POTASSIUM: 4 mmol/L (ref 3.5–5.1)
SODIUM: 137 mmol/L (ref 135–145)

## 2015-09-01 NOTE — Patient Instructions (Signed)
Labs today  Your physician recommends that you schedule a follow-up appointment in: 4 weeks with Oda Kilts, PA  Do the following things EVERYDAY: 1) Weigh yourself in the morning before breakfast. Write it down and keep it in a log. 2) Take your medicines as prescribed 3) Eat low salt foods-Limit salt (sodium) to 2000 mg per day.  4) Stay as active as you can everyday 5) Limit all fluids for the day to less than 2 liters 6)

## 2015-09-06 MED FILL — ATORVASTATIN 80 MG TABLET: 80 | 30 days supply | Qty: 30 | Fill #1

## 2015-09-06 MED FILL — DIGITEK 250 MCG TABLET: 250 | 34 days supply | Qty: 34 | Fill #1

## 2015-09-06 MED FILL — PANTOPRAZOLE SOD DR 20 MG T: 20 | 30 days supply | Qty: 30 | Fill #1

## 2015-09-06 MED FILL — CARVEDILOL 3.125 MG TABLET: 3.125 | 30 days supply | Qty: 60 | Fill #1

## 2015-09-07 ENCOUNTER — Ambulatory Visit (INDEPENDENT_AMBULATORY_CARE_PROVIDER_SITE_OTHER): Payer: Self-pay | Admitting: Internal Medicine

## 2015-09-07 ENCOUNTER — Encounter: Payer: Self-pay | Admitting: Internal Medicine

## 2015-09-07 VITALS — BP 141/88 | HR 64 | Temp 97.6°F | Ht 68.0 in | Wt 163.4 lb

## 2015-09-07 DIAGNOSIS — I5020 Unspecified systolic (congestive) heart failure: Secondary | ICD-10-CM

## 2015-09-07 DIAGNOSIS — I110 Hypertensive heart disease with heart failure: Secondary | ICD-10-CM

## 2015-09-07 DIAGNOSIS — I10 Essential (primary) hypertension: Secondary | ICD-10-CM

## 2015-09-07 DIAGNOSIS — I5022 Chronic systolic (congestive) heart failure: Secondary | ICD-10-CM

## 2015-09-07 MED ORDER — LOSARTAN POTASSIUM 25 MG PO TABS: 25.0000 mg | ORAL_TABLET | Freq: Every day | ORAL | Status: DC

## 2015-09-07 MED FILL — LOSARTAN POTASSIUM 25 MG TA: 25 | 30 days supply | Qty: 30 | Fill #0

## 2015-09-07 NOTE — Patient Instructions (Signed)
General Instructions:  I want you to go ahead and increase Losartan to a full pill every day (25mg ) Please bring your medicines with you each time you come to clinic.  Medicines may include prescription medications, over-the-counter medications, herbal remedies, eye drops, vitamins, or other pills.   Progress Toward Treatment Goals:  No flowsheet data found.  Self Care Goals & Plans:  No flowsheet data found.  No flowsheet data found.   Care Management & Community Referrals:  No flowsheet data found.

## 2015-09-09 DIAGNOSIS — I10 Essential (primary) hypertension: Secondary | ICD-10-CM | POA: Insufficient documentation

## 2015-09-09 NOTE — Assessment & Plan Note (Signed)
A: Essential HTN  P: Increase Losartan to 25mg  daily Continue Coreg 3.125mg  BID and Spironolactone 25mg  daily

## 2015-09-09 NOTE — Assessment & Plan Note (Signed)
A: Chronic systolic heart failure  P: BP is elevated today so will go ahead and increase the losartan to 25mg  daily He has follow up in about 2 weeks with heart failure clinic.

## 2015-09-09 NOTE — Progress Notes (Signed)
[facility] Subjective:   Patient ID: Jerome Barnett male   DOB: 27-Nov-1970 45 y.o.   MRN: [id number]  HPI: [name] is a 45 y.o. male with a PMH detailed below who presents for 1 month follow up for CHF.  Since his last visit he has been seen at the CHF clinic.  Spironolactone has been reintroduced but further titration of his medications have been held as he is currently fasting for religious reasons. Today he reports feeling very well, he is not having trouble with his medications and is not short of breath.  He has no swelling of his feet and can lay flat.    Past Medical History  Diagnosis Date  . CHF (congestive heart failure) ([place])    Current Outpatient Prescriptions  Medication Sig Dispense Refill  . acetaminophen (TYLENOL) 325 MG tablet Take 650 mg by mouth every 6 (six) hours as needed for mild pain.    Marland Kitchen aspirin 81 MG chewable tablet Chew 1 tablet (81 mg total) by mouth daily. 30 tablet 3  . atorvastatin (LIPITOR) 80 MG tablet Take 1 tablet (80 mg total) by mouth daily at 6 PM. 30 tablet 5  . carvedilol (COREG) 3.125 MG tablet Take 1 tablet (3.125 mg total) by mouth 2 (two) times daily with a meal. 60 tablet 5  . digoxin (LANOXIN) 0.25 MG tablet Take 0.5 tablets (0.125 mg total) by mouth daily. 30 tablet 3  . losartan (COZAAR) 25 MG tablet Take 1 tablet (25 mg total) by mouth daily. 30 tablet 5  . pantoprazole (PROTONIX) 20 MG tablet Take 1 tablet (20 mg total) by mouth daily. 30 tablet 2  . spironolactone (ALDACTONE) 25 MG tablet Take 0.5 tablets (12.5 mg total) by mouth daily. 15 tablet 6  . torsemide (DEMADEX) 10 MG tablet If weight increases to 170 or legs start to swell, take 1 pill daily until weight normalizes 30 tablet 1   No current facility-administered medications for this visit.   Family History  Problem Relation Age of Onset  . Hypertension Father   . Hyperlipidemia Father    Social History   Social History  .  Marital Status: Married    Spouse Name: N/A  . Number of Children: N/A  . Years of Education: N/A   Social History Main Topics  . Smoking status: Former Smoker -- 2.00 packs/day for 20 years    Quit date: 03/20/2012  . Smokeless tobacco: Never Used  . Alcohol Use: No  . Drug Use: No  . Sexual Activity: Not Asked   Other Topics Concern  . None   Social History Narrative   Patient is married, no children.   Has pets   Works full time at [employer]: Per HPI  Objective:  Physical Exam: Filed Vitals:   09/07/15 0930  BP: 141/88  Pulse: 64  Temp: 97.6 F (36.4 C)  TempSrc: Oral  Height: 5\' 8"  (1.727 m)  Weight: 163 lb 6.4 oz (74.118 kg)  SpO2: 100%  Physical Exam  Cardiovascular: Normal rate, regular rhythm and normal heart sounds.   No JVD  Pulmonary/Chest: Effort normal and breath sounds normal. No respiratory distress. He has no wheezes. He has no rales.  Musculoskeletal: He exhibits no edema.    Assessment & Plan:  Case discussed with Dr. Daryll Drown  Essential hypertension A: Essential HTN  P: Increase Losartan to 25mg  daily Continue Coreg 3.125mg  BID and Spironolactone 25mg  daily  Heart  failure with reduced ejection fraction, NYHA class II (HCC) A: Chronic systolic heart failure  P: BP is elevated today so will go ahead and increase the losartan to 25mg  daily He has follow up in about 2 weeks with heart failure clinic.    Medications Ordered Meds ordered this encounter  Medications  . losartan (COZAAR) 25 MG tablet    Sig: Take 1 tablet (25 mg total) by mouth daily.    Dispense:  30 tablet    Refill:  5   Other Orders No orders of the defined types were placed in this encounter.   Follow Up: Return 2-3 months.

## 2015-09-14 NOTE — Progress Notes (Signed)
Internal Medicine Clinic Attending  Case discussed with Dr. Hoffman soon after the resident saw the patient.  We reviewed the resident's history and exam and pertinent patient test results.  I agree with the assessment, diagnosis, and plan of care documented in the resident's note. 

## 2015-09-29 ENCOUNTER — Ambulatory Visit (HOSPITAL_COMMUNITY)
Admission: RE | Admit: 2015-09-29 | Discharge: 2015-09-29 | Disposition: A | Discharge status: Home / Self Care | Payer: 59 | Source: Ambulatory Visit | Attending: Cardiology | Admitting: Cardiology

## 2015-09-29 VITALS — BP 144/100 | HR 82 | Wt 162.8 lb

## 2015-09-29 DIAGNOSIS — I509 Heart failure, unspecified: Secondary | ICD-10-CM | POA: Diagnosis not present

## 2015-09-29 DIAGNOSIS — I5022 Chronic systolic (congestive) heart failure: Secondary | ICD-10-CM | POA: Insufficient documentation

## 2015-09-29 DIAGNOSIS — Z880 Allergy status to penicillin: Secondary | ICD-10-CM | POA: Diagnosis not present

## 2015-09-29 DIAGNOSIS — I429 Cardiomyopathy, unspecified: Secondary | ICD-10-CM | POA: Insufficient documentation

## 2015-09-29 DIAGNOSIS — E785 Hyperlipidemia, unspecified: Secondary | ICD-10-CM | POA: Diagnosis not present

## 2015-09-29 DIAGNOSIS — I959 Hypotension, unspecified: Secondary | ICD-10-CM | POA: Diagnosis not present

## 2015-09-29 DIAGNOSIS — I110 Hypertensive heart disease with heart failure: Secondary | ICD-10-CM | POA: Insufficient documentation

## 2015-09-29 DIAGNOSIS — I5020 Unspecified systolic (congestive) heart failure: Secondary | ICD-10-CM

## 2015-09-29 DIAGNOSIS — I739 Peripheral vascular disease, unspecified: Secondary | ICD-10-CM | POA: Diagnosis not present

## 2015-09-29 DIAGNOSIS — K219 Gastro-esophageal reflux disease without esophagitis: Secondary | ICD-10-CM | POA: Insufficient documentation

## 2015-09-29 DIAGNOSIS — Z87891 Personal history of nicotine dependence: Secondary | ICD-10-CM | POA: Insufficient documentation

## 2015-09-29 DIAGNOSIS — M7989 Other specified soft tissue disorders: Secondary | ICD-10-CM | POA: Diagnosis not present

## 2015-09-29 DIAGNOSIS — I10 Essential (primary) hypertension: Secondary | ICD-10-CM | POA: Diagnosis not present

## 2015-09-29 LAB — BASIC METABOLIC PANEL
ANION GAP: 9 (ref 5–15)
BUN: 12 mg/dL (ref 6–20)
CALCIUM: 9.3 mg/dL (ref 8.9–10.3)
CO2: 26 mmol/L (ref 22–32)
Chloride: 105 mmol/L (ref 101–111)
Creatinine, Ser: 0.97 mg/dL (ref 0.61–1.24)
Glucose, Bld: 121 mg/dL — ABNORMAL HIGH (ref 65–99)
Potassium: 4 mmol/L (ref 3.5–5.1)
SODIUM: 140 mmol/L (ref 135–145)

## 2015-09-29 MED ORDER — SACUBITRIL-VALSARTAN 24-26 MG PO TABS: 1.0000 | ORAL_TABLET | Freq: Two times a day (BID) | ORAL | Status: DC

## 2015-09-29 MED FILL — SPIRONOLACTONE 25 MG TABLET: 25 | 31 days supply | Qty: 16 | Fill #2

## 2015-09-29 NOTE — Progress Notes (Signed)
Advanced Heart Failure Medication Review by a Pharmacist  Does the patient  feel that his/her medications are working for him/her?  yes  Has the patient been experiencing any side effects to the medications prescribed?  no  Does the patient measure his/her own blood pressure or blood glucose at home?  yes   Does the patient have any problems obtaining medications due to transportation or finances?   No - now has [facility]  Understanding of regimen: good Understanding of indications: good Potential of compliance: good Patient understands to avoid NSAIDs. Patient understands to avoid decongestants.  Issues to address at subsequent visits: None   Pharmacist comments:  [name] is a pleasant 45 yo M presenting without a medication list but with good recall of his regimen. He reports great compliance with his regimen and did not have any specific medication-related questions or concerns for me at this time.   Ruta Hinds. Velva Harman, PharmD, BCPS, CPP Clinical Pharmacist Pager: [phone] Phone: [phone] 09/29/2015 10:02 AM      Time with patient: 8 minutes Preparation and documentation time: 2 minutes Total time: 10 minutes

## 2015-09-29 NOTE — Progress Notes (Signed)
Patient ID: Jerome Barnett, male   DOB: [birth year], 45 y.o.   MRN: [id number]    ADVANCED HF CLINIC CONSULT NOTE  Primary HF: Dr. Haroldine Laws   HPI:  Jerome Barnett is a 45 year old male with h/o HL, HTN and GERD who was admitted in 4/17 with acute HF. EF 15%.  Underwent cath in 4/17 normal cors with low filling pressures and normal cardiac output. Procedure complicated by radial artery spasm requiring multiple rounds of verapamil and NTG. Patient then developed shock and was supported with norepi and milrinone transiently. Was discharged home and then readmitted several days later with hypotension in setting of volume depletion due to severe restriction of po intake.    Seen in ED 08/03/15 and found to be orthostatic. Symptoms improved with 500 cc of IVF.   He presents today for regular follow up. No changes at last appointment as he was fasting for Ramadan. Has been feeling great. No longer fasting. Weight up 1 lb from last visit. Denies swelling or lightheadedness/dizziness. Working full time without difficulty. Out in the heat most of the day, drinking plenty of fluids. Denies any DOE. No orthopnea, bendopnea, or PND. No CP. Has been eating more.    Echo 07/04/15 LVEF 10-15%, normal RV  RHC/LHC  RA = 2 RV = 28/0/4 PA = 28/11 (18) PCW = 5 Fick cardiac output/index = 4.3/2.3 PVR = 3.0 WU FA sat = 94% PA sat = 60%, 63% Assessment: 1. Normal coronary arteries 2. Low filling pressures with normal cardiac output 3. Severe NICM with EF 10% by echo 3. Development of severe hypotension and shock in cath lab due to vasodilators to treat radial artery spasm    Current Outpatient Prescriptions  Medication Sig Dispense Refill  . acetaminophen (TYLENOL) 325 MG tablet Take 650 mg by mouth every 6 (six) hours as needed for mild pain.    Marland Kitchen aspirin 81 MG chewable tablet Chew 1 tablet (81 mg total) by mouth daily. 30 tablet 3  . atorvastatin (LIPITOR) 80 MG tablet Take 1 tablet (80 mg total)  by mouth daily at 6 PM. 30 tablet 5  . carvedilol (COREG) 3.125 MG tablet Take 1 tablet (3.125 mg total) by mouth 2 (two) times daily with a meal. 60 tablet 5  . digoxin (LANOXIN) 0.25 MG tablet Take 0.5 tablets (0.125 mg total) by mouth daily. 30 tablet 3  . losartan (COZAAR) 25 MG tablet Take 1 tablet (25 mg total) by mouth daily. 30 tablet 5  . pantoprazole (PROTONIX) 20 MG tablet Take 1 tablet (20 mg total) by mouth daily. 30 tablet 2  . spironolactone (ALDACTONE) 25 MG tablet Take 0.5 tablets (12.5 mg total) by mouth daily. 15 tablet 6  . torsemide (DEMADEX) 10 MG tablet Take 10 mg by mouth daily as needed (for weight >170 lbs or leg swelling). Reported on 09/29/2015     No current facility-administered medications for this encounter.    Allergies  Allergen Reactions  . Amoxicillin Swelling    Eye swelling  . Lasix [Furosemide] Swelling    Eye swelling      Social History   Social History  . Marital Status: Married    Spouse Name: N/A  . Number of Children: N/A  . Years of Education: N/A   Occupational History  . Not on file.   Social History Main Topics  . Smoking status: Former Smoker -- 2.00 packs/day for 20 years    Quit date: 03/20/2012  . Smokeless tobacco: Never Used  .  Alcohol Use: No  . Drug Use: No  . Sexual Activity: Not on file   Other Topics Concern  . Not on file   Social History Narrative   Patient is married, no children.   Has pets   Works full time at [employer]       Family History  Problem Relation Age of Onset  . Hypertension Father   . Hyperlipidemia Father     Danley Danker Vitals:   09/29/15 0934  BP: 144/100  Pulse: 82  Weight: 162 lb 12.8 oz (73.846 kg)  SpO2: 99%   Wt Readings from Last 3 Encounters:  09/29/15 162 lb 12.8 oz (73.846 kg)  09/07/15 163 lb 6.4 oz (74.118 kg)  09/01/15 161 lb 9.6 oz (73.301 kg)     PHYSICAL EXAM: General:  Well appearing. NAD HEENT: normal Neck: supple. no JVD. Carotids 2+ bilat; no  bruits. No thyromegaly or nodule noted.  Cor: PMI laterally displaced. Regular rate and rhythm. No rubs, gallops, murmurs. Lungs: Clear, normal effort.  Abdomen: soft, non-tender, non-distended, no HSM. No bruits or masses. +BS  Extremities: no cyanosis, clubbing, rash, edema.  Neuro: alert & oriented x 3, cranial nerves grossly intact. moves all 4 extremities w/o difficulty. Affect pleasant.   ASSESSMENT & PLAN: 1. Chronic systolic heart failure - cath 4/17 with normal coronaries. EF 15%. ? Viral CM --NYHA II. Volume status stable on exam.  -- Stop losartan. Switch to Entresto 24/26 mg BID. Labs today and follow up with pharmacy in 10 days for labs and med titration.  -- Continue coreg 3.125 mg BID. Consider increasing next visit.  -- Continue digoxin 0.125 mcg daily. Level stable last check, will follow next visit.  -- Continue torsemide 10 mg ONLY AS NEEDED.  -- Continue spiro 12.5 mg    -- Will plan on checking echo in 12/2015 to look for improvement of EF with continued med up-titration.  2. RUE swelling.  -- Resolved. US showed superficial clot. Treating with heating pad and rest. 3. HLD -- Cont atorvastatin 80 mg.  Per PCP.  4. HTN -- Switching back to [place] as above.   Med changes and labs as above. Follow up 2 weeks with Pharm-D for continued med titration. Follow up 6 weeks with MD.   Shirley Friar, PA-C 9:43 AM  Total time spent > 25 minutes. Over half that spent discussing the above.

## 2015-09-29 NOTE — Patient Instructions (Signed)
STOP Losartan START Entrestoo 24/26 mg ,one tab twice a day  Labs today  Your physician recommends that you schedule a follow-up appointment in: 2 weeks with the CHF pharmacist Your physician recommends that you schedule a follow-up appointment in: 6 weeks with Dr Haroldine Laws  Do the following things EVERYDAY: 1) Weigh yourself in the morning before breakfast. Write it down and keep it in a log. 2) Take your medicines as prescribed 3) Eat low salt foods-Limit salt (sodium) to 2000 mg per day.  4) Stay as active as you can everyday 5) Limit all fluids for the day to less than 2 liters 6)

## 2015-10-11 ENCOUNTER — Ambulatory Visit (HOSPITAL_COMMUNITY)
Admission: RE | Admit: 2015-10-11 | Discharge: 2015-10-11 | Disposition: A | Discharge status: Home / Self Care | Payer: 59 | Source: Ambulatory Visit | Attending: Internal Medicine | Admitting: Internal Medicine

## 2015-10-11 ENCOUNTER — Telehealth (HOSPITAL_COMMUNITY): Payer: Self-pay | Admitting: Pharmacist

## 2015-10-11 VITALS — BP 134/88 | HR 83 | Wt 162.2 lb

## 2015-10-11 DIAGNOSIS — I110 Hypertensive heart disease with heart failure: Secondary | ICD-10-CM | POA: Diagnosis not present

## 2015-10-11 DIAGNOSIS — I509 Heart failure, unspecified: Secondary | ICD-10-CM | POA: Diagnosis present

## 2015-10-11 DIAGNOSIS — K219 Gastro-esophageal reflux disease without esophagitis: Secondary | ICD-10-CM | POA: Diagnosis not present

## 2015-10-11 DIAGNOSIS — Z79899 Other long term (current) drug therapy: Secondary | ICD-10-CM | POA: Diagnosis not present

## 2015-10-11 DIAGNOSIS — I5022 Chronic systolic (congestive) heart failure: Secondary | ICD-10-CM | POA: Diagnosis not present

## 2015-10-11 DIAGNOSIS — I5020 Unspecified systolic (congestive) heart failure: Secondary | ICD-10-CM

## 2015-10-11 DIAGNOSIS — E785 Hyperlipidemia, unspecified: Secondary | ICD-10-CM | POA: Insufficient documentation

## 2015-10-11 LAB — BASIC METABOLIC PANEL
ANION GAP: 6 (ref 5–15)
BUN: 12 mg/dL (ref 6–20)
CALCIUM: 9.4 mg/dL (ref 8.9–10.3)
CO2: 28 mmol/L (ref 22–32)
CREATININE: 0.99 mg/dL (ref 0.61–1.24)
Chloride: 105 mmol/L (ref 101–111)
Glucose, Bld: 125 mg/dL — ABNORMAL HIGH (ref 65–99)
Potassium: 3.5 mmol/L (ref 3.5–5.1)
SODIUM: 139 mmol/L (ref 135–145)

## 2015-10-11 MED ORDER — CARVEDILOL 6.25 MG PO TABS: 6.2500 mg | ORAL_TABLET | Freq: Two times a day (BID) | ORAL | 3 refills | Status: DC

## 2015-10-11 MED FILL — CARVEDILOL 6.25 MG TABLET: 6.25 | 30 days supply | Qty: 60 | Fill #0

## 2015-10-11 NOTE — Progress Notes (Signed)
HPI:  [name] is a 45 year old male with h/o HL, HTN and GERD who was admitted in 4/17 with acute systolic HF (EF 0000000).   Underwent cath in 4/17 normal cors with low filling pressures and normal cardiac output. Procedure complicated by radial artery spasm requiring multiple rounds of verapamil and NTG. Patient then developed shock and was supported with norepi and milrinone transiently. Was discharged home and then readmitted several days later with hypotension in setting of volume depletion due to severe restriction of po intake.    Seen in ED 08/03/15 and found to be orthostatic. Symptoms improved with 500 cc of IVF.   He presents today for pharmacist-led HF medication titration. At last HF clinic visit on 7/12, Jerome Barnett was restarted at 24-26 mg BID and losartan was discontinued. He had issues with excessive lightheadedness in the past on this dose. Has been feeling great recently but noted lightheadedness about 30 minutes after taking Entresto which resolves within a few hours. Denies swelling or lightheadedness/dizziness. Working full time without difficulty. Out in the heat most of the day, drinking plenty of fluids. Denies any DOE. No orthopnea, bendopnea, or PND. No CP. Has been eating more.   . Shortness of breath/dyspnea on exertion? no  . Orthopnea/PND? no . Edema? no . Lightheadedness/dizziness? Yes - after taking Entresto . Daily weights at home? Yes - ~160-161 lbs . Blood pressure/heart rate monitoring at home? Yes - SBP 130-150s - measuring usually ~15 mins after meds, HR usually 90 bpm  . Following low-sodium/fluid-restricted diet? yes  HF Medications: Carvedilol 3.125 mg PO BID Digoxin 0.125 mg PO daily Entresto 24-26 mg PO BID Spironolactone 12.5 mg PO daily  Torsemide 10 mg PO daily PRN for weight >170 lbs or leg swelling -- has not needed in months   Has the patient been experiencing any side effects to the medications prescribed?  Yes - some lightheadedness with  Entresto restart  Does the patient have any problems obtaining medications due to transportation or finances?   No - [employer] commercial (has $6000 deductible) + copay card for Entresto (up to $1500/yr)  Understanding of regimen: good Understanding of indications: good Potential of compliance: excellent Patient understands to avoid NSAIDs. Patient understands to avoid decongestants.    Pertinent Lab Values: . 10/11/15: Serum creatinine 0.99 (BL ~0.9-1), BUN 12, Potassium 3.5, Sodium 139 . 08/03/15: Digoxin 0.5   Vital Signs: . Weight: 162 lb (last HF clinic visit weight: 162) . Blood pressure: 134/88 mmHg  . Heart rate: 83 bpm   1. Chronic systolic CHF (EF 0000000), NICM. NYHA class II symptoms. - Volume status stable today - Continue digoxin 0.125 mg daily, Entresto 24-26 mg BID, spironolactone 12.5 mg daily, torsemide 10 mg daily PRN for weight > 170 lbs or leg swelling - Increase carvedilol to 6.25 mg BID - Basic disease state pathophysiology, medication indication, mechanism and side effects reviewed at length with patient and he verbalized understanding 2. HTN  - BP remains above goal  - Continue Entresto, spironolactone - Increase carvedilol as above 3. HLD - Continue atorvastatin 80 mg daily   Plan: 1) Medication changes: Based on clinical presentation, vital signs and recent labs will increase carvedilol to 6.25 mg PO BID 2) Labs: BMET today  3) Follow-up: Dr. Haroldine Barnett on 9/1   Jerome Barnett. Jerome Barnett, PharmD, BCPS, CPP Clinical Pharmacist Pager: [phone] Phone: [phone] 10/11/2015 2:18 PM   Agree with PharmD note as above. He is improving. Continue to titrate HF meds as tolerated. I  will see him back 9/1. Needs repeat echo.   Jerome Keats,MD 10:45 PM

## 2015-10-11 NOTE — Patient Instructions (Signed)
It was great to see you today!  Please INCREASE your carvedilol to 6.25 mg by mouth TWICE daily.  Labs today. Will call you with any abnormalities.  Please keep your appointment with Dr. Haroldine Laws on 9/1.

## 2015-10-11 NOTE — Telephone Encounter (Signed)
Entresto (up to 49-51 mg BID) PA approved by [facility] commercial through 10/10/16.   Ruta Hinds. Velva Harman, PharmD, BCPS, CPP Clinical Pharmacist Pager: [phone] Phone: [phone] 10/11/2015 12:12 PM

## 2015-11-01 ENCOUNTER — Other Ambulatory Visit (HOSPITAL_COMMUNITY): Payer: Self-pay | Admitting: *Deleted

## 2015-11-01 MED ORDER — SPIRONOLACTONE 25 MG PO TABS: 12.5000 mg | ORAL_TABLET | Freq: Every day | ORAL | 6 refills | Status: DC

## 2015-11-01 MED FILL — ATORVASTATIN 80 MG TABLET: 80 | 30 days supply | Qty: 30 | Fill #2

## 2015-11-01 MED FILL — SPIRONOLACTONE 25 MG TABLET: 25 | 30 days supply | Qty: 15 | Fill #0

## 2015-11-01 MED FILL — PANTOPRAZOLE SOD DR 20 MG T: 20 | 30 days supply | Qty: 30 | Fill #2

## 2015-11-01 MED FILL — ENTRESTO 24 MG-26 MG TABLET: 24-26 | 30 days supply | Qty: 60 | Fill #0

## 2015-11-09 MED FILL — CARVEDILOL 6.25 MG TABLET: 6.25 | 30 days supply | Qty: 60 | Fill #1

## 2015-11-09 MED FILL — DIGITEK 250 MCG TABLET: 250 | 31 days supply | Qty: 31 | Fill #2

## 2015-11-10 ENCOUNTER — Encounter: Payer: Self-pay | Admitting: Internal Medicine

## 2015-11-19 ENCOUNTER — Ambulatory Visit (HOSPITAL_COMMUNITY)
Admission: RE | Admit: 2015-11-19 | Discharge: 2015-11-19 | Disposition: A | Discharge status: Home / Self Care | Payer: 59 | Source: Ambulatory Visit | Attending: Internal Medicine | Admitting: Internal Medicine

## 2015-11-19 VITALS — BP 150/90 | HR 67 | Wt 167.4 lb

## 2015-11-19 DIAGNOSIS — I509 Heart failure, unspecified: Secondary | ICD-10-CM

## 2015-11-19 DIAGNOSIS — I5020 Unspecified systolic (congestive) heart failure: Secondary | ICD-10-CM

## 2015-11-19 DIAGNOSIS — Z881 Allergy status to other antibiotic agents status: Secondary | ICD-10-CM | POA: Diagnosis not present

## 2015-11-19 DIAGNOSIS — K219 Gastro-esophageal reflux disease without esophagitis: Secondary | ICD-10-CM | POA: Insufficient documentation

## 2015-11-19 DIAGNOSIS — Z7982 Long term (current) use of aspirin: Secondary | ICD-10-CM | POA: Diagnosis not present

## 2015-11-19 DIAGNOSIS — E785 Hyperlipidemia, unspecified: Secondary | ICD-10-CM | POA: Diagnosis not present

## 2015-11-19 DIAGNOSIS — Z8249 Family history of ischemic heart disease and other diseases of the circulatory system: Secondary | ICD-10-CM | POA: Insufficient documentation

## 2015-11-19 DIAGNOSIS — Z87891 Personal history of nicotine dependence: Secondary | ICD-10-CM | POA: Diagnosis not present

## 2015-11-19 DIAGNOSIS — I10 Essential (primary) hypertension: Secondary | ICD-10-CM

## 2015-11-19 DIAGNOSIS — I428 Other cardiomyopathies: Secondary | ICD-10-CM | POA: Insufficient documentation

## 2015-11-19 DIAGNOSIS — Z888 Allergy status to other drugs, medicaments and biological substances status: Secondary | ICD-10-CM | POA: Diagnosis not present

## 2015-11-19 DIAGNOSIS — I5022 Chronic systolic (congestive) heart failure: Secondary | ICD-10-CM | POA: Diagnosis not present

## 2015-11-19 DIAGNOSIS — I110 Hypertensive heart disease with heart failure: Secondary | ICD-10-CM | POA: Diagnosis not present

## 2015-11-19 DIAGNOSIS — Z79899 Other long term (current) drug therapy: Secondary | ICD-10-CM | POA: Diagnosis not present

## 2015-11-19 MED ORDER — SPIRONOLACTONE 25 MG PO TABS: 25.0000 mg | ORAL_TABLET | Freq: Every day | ORAL | 6 refills | Status: DC

## 2015-11-19 NOTE — Patient Instructions (Signed)
STOP Digoxin.  INCREASE Spirolactone to 25 mg once daily.  Return in 1-2 weeks for labs.  Follow up 6 weeks with echocardiogram and appointment with Dr. Sharee Holster.  Do the following things EVERYDAY: 1) Weigh yourself in the morning before breakfast. Write it down and keep it in a log. 2) Take your medicines as prescribed 3) Eat low salt foods-Limit salt (sodium) to 2000 mg per day.  4) Stay as active as you can everyday 5) Limit all fluids for the day to less than 2 liters

## 2015-11-19 NOTE — Progress Notes (Signed)
Patient ID: Jerome Barnett, male   DOB: 06/26/1970, 45 y.o.   MRN: [id number]    ADVANCED HF CLINIC CONSULT NOTE  Primary HF: Dr. Haroldine Laws   HPI:  Mr Matzinger is a 45 year old male with h/o HL, HTN and GERD who was admitted in 4/17 with acute HF. EF 15%.  Underwent cath in 4/17 normal cors with low filling pressures and normal cardiac output. Procedure complicated by radial artery spasm requiring multiple rounds of verapamil and NTG. Patient then developed shock and was supported with norepi and milrinone transiently. Was discharged home and then readmitted several days later with hypotension in setting of volume depletion due to severe restriction of po intake.    Seen in ED 08/03/15 and found to be orthostatic. Symptoms improved with 500 cc of IVF.   He presents today for HF follow up. Tolerated increased carvedilol. Overall feeling good. Denies SOB/PND/Orthopnea/dizziness. Has not needed any torsemide. Walking 6,000 steps a day.  Appetite ok. Working full time. 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   Echo 07/04/15 LVEF 10-15%, normal RV  RHC/LHC 06/2015  RA = 2 RV = 28/0/4 PA = 28/11 (18) PCW = 5 Fick cardiac output/index = 4.3/2.3 PVR = 3.0 WU FA sat = 94% PA sat = 60%, 63% Assessment: 1. Normal coronary arteries 2. Low filling pressures with normal cardiac output 3. Severe NICM with EF 10% by echo 3. Development of severe hypotension and shock in cath lab due to vasodilators to treat radial artery spasm    Current Outpatient Prescriptions  Medication Sig Dispense Refill  . acetaminophen (TYLENOL) 325 MG tablet Take 650 mg by mouth every 6 (six) hours as needed for mild pain.    Marland Kitchen aspirin 81 MG chewable tablet Chew 1 tablet (81 mg total) by mouth daily. 30 tablet 3  . atorvastatin (LIPITOR) 80 MG tablet Take 1 tablet (80 mg total) by mouth daily at 6 PM. 30 tablet 5  . carvedilol (COREG) 6.25 MG tablet Take 1 tablet (6.25 mg total) by mouth 2 (two)  times daily. 60 tablet 3  . digoxin (LANOXIN) 0.25 MG tablet Take 0.5 tablets (0.125 mg total) by mouth daily. 30 tablet 3  . pantoprazole (PROTONIX) 20 MG tablet Take 1 tablet (20 mg total) by mouth daily. 30 tablet 2  . sacubitril-valsartan (ENTRESTO) 24-26 MG Take 1 tablet by mouth 2 (two) times daily. 60 tablet 6  . spironolactone (ALDACTONE) 25 MG tablet Take 0.5 tablets (12.5 mg total) by mouth daily. 15 tablet 6  . torsemide (DEMADEX) 10 MG tablet Take 10 mg by mouth daily as needed (for weight >170 lbs or leg swelling). Reported on 09/29/2015     No current facility-administered medications for this encounter.     Allergies  Allergen Reactions  . Amoxicillin Swelling    Eye swelling  . Lasix [Furosemide] Swelling    Eye swelling      Social History   Social History  . Marital status: Married    Spouse name: N/A  . Number of children: N/A  . Years of education: N/A   Occupational History  . Not on file.   Social History Main Topics  . Smoking status: Former Smoker    Packs/day: 2.00    Years: 20.00    Quit date: 03/20/2012  . Smokeless tobacco: Never Used  . Alcohol use No  . Drug use: No  . Sexual activity: Not on file   Other Topics Concern  .  Not on file   Social History Narrative   Patient is married, no children.   Has pets   Works full time at [employer]       Family History  Problem Relation Age of Onset  . Hypertension Father   . Hyperlipidemia Father     Vitals:   11/19/15 0923  BP: (!) 150/90  Pulse: 67  SpO2: 99%  Weight: 167 lb 6.4 oz (75.9 kg)   Wt Readings from Last 3 Encounters:  11/19/15 167 lb 6.4 oz (75.9 kg)  10/11/15 162 lb 3.2 oz (73.6 kg)  09/29/15 162 lb 12.8 oz (73.8 kg)     PHYSICAL EXAM: General:  Well appearing. NAD HEENT: normal Neck: supple. no JVD. Carotids 2+ bilat; no bruits. No thyromegaly or nodule noted.  Cor: PMI laterally displaced. Regular rate and rhythm. No rubs, gallops, murmurs. Lungs: Clear,  normal effort.  Abdomen: soft, non-tender, non-distended, no HSM. No bruits or masses. +BS  Extremities: no cyanosis, clubbing, rash, edema.  Neuro: alert & oriented x 3, cranial nerves grossly intact. moves all 4 extremities w/o difficulty. Affect pleasant.   ASSESSMENT & PLAN: 1. Chronic systolic heart failure - cath 4/17 with normal coronaries. EF 15%. ? Viral CM --NYHA I. Volume status stable on exam. Only takes torsemide as needed.  -- Continue coreg 6.25 mg BID.  -- Continue digoxin 0.125 mcg daily. Level 0.5 on 08/03/15.  -- Continue torsemide 10 mg ONLY AS NEEDED.  -- Increase spiro to 25 mg daily. Continue spiro 12.5 mg    -- Will plan on checking echo next month.    2. RUE swelling.  -- Resolved.  3. HLD -- Cont atorvastatin 80 mg.  Per PCP.  4. HTN --Elevated. Continue current regimen. Increase spiro to 25 mg daily.   Follow up next month with an ECHO.   Darrick Grinder, NP-C  9:31 AM   Patient seen and examined with Darrick Grinder, NP. We discussed all aspects of the encounter. I agree with the assessment and plan as stated above.   He is much improved. NYHA I. Volume status looks good. I performed bedside echo in clinic personally. LVEF ~45%. BP still up a bit. Will increase spiro to 25. See back in 1 month with formal echo. Can stop digoxin.   Bensimhon, Daniel,MD 10:26 PM

## 2015-11-29 MED FILL — ENTRESTO 24 MG-26 MG TABLET: 24-26 | 30 days supply | Qty: 60 | Fill #1

## 2015-11-29 MED FILL — ATORVASTATIN 80 MG TABLET: 80 | 30 days supply | Qty: 30 | Fill #3

## 2015-11-30 MED FILL — SPIRONOLACTONE 25 MG TABLET: 25 | 30 days supply | Qty: 30 | Fill #0

## 2015-12-03 ENCOUNTER — Ambulatory Visit (HOSPITAL_COMMUNITY)
Admission: RE | Admit: 2015-12-03 | Discharge: 2015-12-03 | Disposition: A | Discharge status: Home / Self Care | Payer: 59 | Source: Ambulatory Visit | Attending: Cardiology | Admitting: Cardiology

## 2015-12-03 DIAGNOSIS — I509 Heart failure, unspecified: Secondary | ICD-10-CM | POA: Diagnosis not present

## 2015-12-03 DIAGNOSIS — I5020 Unspecified systolic (congestive) heart failure: Secondary | ICD-10-CM

## 2015-12-03 LAB — BASIC METABOLIC PANEL
ANION GAP: 6 (ref 5–15)
BUN: 15 mg/dL (ref 6–20)
CALCIUM: 9.5 mg/dL (ref 8.9–10.3)
CO2: 25 mmol/L (ref 22–32)
CREATININE: 1.1 mg/dL (ref 0.61–1.24)
Chloride: 106 mmol/L (ref 101–111)
GFR calc Af Amer: >60 mL/min (ref >60)
GFR calc non Af Amer: >60 mL/min (ref >60)
GLUCOSE: 172 mg/dL — AB (ref 65–99)
Potassium: 4.2 mmol/L (ref 3.5–5.1)
Sodium: 137 mmol/L (ref 135–145)

## 2015-12-15 MED FILL — CARVEDILOL 6.25 MG TABLET: 6.25 | 30 days supply | Qty: 60 | Fill #2

## 2015-12-31 MED FILL — SPIRONOLACTONE 25 MG TABLET: 25 | 30 days supply | Qty: 30 | Fill #1

## 2015-12-31 MED FILL — ENTRESTO 24 MG-26 MG TABLET: 24-26 | 30 days supply | Qty: 60 | Fill #2

## 2015-12-31 MED FILL — ATORVASTATIN 80 MG TABLET: 80 | 30 days supply | Qty: 30 | Fill #4

## 2016-01-07 ENCOUNTER — Ambulatory Visit (HOSPITAL_COMMUNITY)
Admission: RE | Admit: 2016-01-07 | Discharge: 2016-01-07 | Disposition: A | Discharge status: Home / Self Care | Payer: 59 | Source: Ambulatory Visit | Attending: Internal Medicine | Admitting: Internal Medicine

## 2016-01-07 ENCOUNTER — Ambulatory Visit (HOSPITAL_BASED_OUTPATIENT_CLINIC_OR_DEPARTMENT_OTHER)
Admission: RE | Admit: 2016-01-07 | Discharge: 2016-01-07 | Disposition: A | Discharge status: Home / Self Care | Payer: 59 | Source: Ambulatory Visit | Attending: Internal Medicine | Admitting: Internal Medicine

## 2016-01-07 VITALS — BP 124/90 | HR 70 | Wt 170.8 lb

## 2016-01-07 DIAGNOSIS — I5020 Unspecified systolic (congestive) heart failure: Secondary | ICD-10-CM | POA: Diagnosis not present

## 2016-01-07 DIAGNOSIS — I5022 Chronic systolic (congestive) heart failure: Secondary | ICD-10-CM

## 2016-01-07 DIAGNOSIS — I10 Essential (primary) hypertension: Secondary | ICD-10-CM | POA: Diagnosis not present

## 2016-01-07 LAB — BASIC METABOLIC PANEL
Anion gap: 5 (ref 5–15)
BUN: 15 mg/dL (ref 6–20)
CHLORIDE: 107 mmol/L (ref 101–111)
CO2: 26 mmol/L (ref 22–32)
CREATININE: 0.93 mg/dL (ref 0.61–1.24)
Calcium: 9.1 mg/dL (ref 8.9–10.3)
GFR calc Af Amer: >60 mL/min (ref >60)
GFR calc non Af Amer: >60 mL/min (ref >60)
Glucose, Bld: 121 mg/dL — ABNORMAL HIGH (ref 65–99)
Potassium: 4.3 mmol/L (ref 3.5–5.1)
Sodium: 138 mmol/L (ref 135–145)

## 2016-01-07 MED ORDER — SACUBITRIL-VALSARTAN 49-51 MG PO TABS: 1.0000 | ORAL_TABLET | Freq: Two times a day (BID) | ORAL | 6 refills | Status: DC

## 2016-01-07 MED FILL — ENTRESTO 49 MG-51 MG TABLET: 49-51 | 30 days supply | Qty: 60 | Fill #0

## 2016-01-07 NOTE — Progress Notes (Signed)
Echocardiogram 2D Echocardiogram has been performed.  Jerome Barnett 01/07/2016, 9:27 AM

## 2016-01-07 NOTE — Addendum Note (Signed)
Encounter addended by: Kerry Dory, CMA on: 01/07/2016 10:35 AM<BR>    Actions taken: Visit diagnoses modified, Order Entry activity accessed, Diagnosis association updated, Sign clinical note

## 2016-01-07 NOTE — Progress Notes (Addendum)
Patient ID: Jerome Barnett, male   DOB: February 09, 1971, 45 y.o.   MRN: [id number]    ADVANCED HF CLINIC CONSULT NOTE  Primary HF: Dr. Haroldine Laws   HPI:  Jerome Barnett is a 45 year old male with h/o HL, HTN and GERD who was admitted in 4/17 with acute HF. EF 15%.  Underwent cath in 4/17 normal cors with low filling pressures and normal cardiac output. Procedure complicated by radial artery spasm requiring multiple rounds of verapamil and NTG. Patient then developed shock and was supported with norepi and milrinone transiently. Was discharged home and then readmitted several days later with hypotension in setting of volume depletion due to severe restriction of po intake.    Seen in ED 08/03/15 and found to be orthostatic. Symptoms improved with 500 cc of IVF.   He presents today for HF follow up. Overall feeling good. Denies SOB/PND/Orthopnea/dizziness. Last week needed one dose of torsemide but that is it. Walking 6,000+ steps a day. Working full time. 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   Echo 07/04/15 LVEF 10-15%, normal RV ECHO 01/08/16 EF ~40% normal RV  RHC/LHC 06/2015  RA = 2 RV = 28/0/4 PA = 28/11 (18) PCW = 5 Fick cardiac output/index = 4.3/2.3 PVR = 3.0 WU FA sat = 94% PA sat = 60%, 63% Assessment: 1. Normal coronary arteries 2. Low filling pressures with normal cardiac output 3. Severe NICM with EF 10% by echo 3. Development of severe hypotension and shock in cath lab due to vasodilators to treat radial artery spasm    Current Outpatient Prescriptions  Medication Sig Dispense Refill  . acetaminophen (TYLENOL) 325 MG tablet Take 650 mg by mouth every 6 (six) hours as needed for mild pain.    Marland Kitchen aspirin 81 MG chewable tablet Chew 1 tablet (81 mg total) by mouth daily. 30 tablet 3  . atorvastatin (LIPITOR) 80 MG tablet Take 1 tablet (80 mg total) by mouth daily at 6 PM. 30 tablet 5  . carvedilol (COREG) 6.25 MG tablet Take 1 tablet (6.25 mg total) by mouth 2  (two) times daily. 60 tablet 3  . pantoprazole (PROTONIX) 20 MG tablet Take 1 tablet (20 mg total) by mouth daily. 30 tablet 2  . sacubitril-valsartan (ENTRESTO) 24-26 MG Take 1 tablet by mouth 2 (two) times daily. 60 tablet 6  . spironolactone (ALDACTONE) 25 MG tablet Take 1 tablet (25 mg total) by mouth daily. 30 tablet 6  . torsemide (DEMADEX) 10 MG tablet Take 10 mg by mouth daily as needed (for weight >170 lbs or leg swelling). Reported on 09/29/2015     No current facility-administered medications for this encounter.     Allergies  Allergen Reactions  . Amoxicillin Swelling    Eye swelling  . Lasix [Furosemide] Swelling    Eye swelling      Social History   Social History  . Marital status: Married    Spouse name: N/A  . Number of children: N/A  . Years of education: N/A   Occupational History  . Not on file.   Social History Main Topics  . Smoking status: Former Smoker    Packs/day: 2.00    Years: 20.00    Quit date: 03/20/2012  . Smokeless tobacco: Never Used  . Alcohol use No  . Drug use: No  . Sexual activity: Not on file   Other Topics Concern  . Not on file   Social History Narrative   Patient is married, no children.  Has pets   Works full time at [employer]       Family History  Problem Relation Age of Onset  . Hypertension Father   . Hyperlipidemia Father     Vitals:   01/07/16 0949  BP: 124/90  Pulse: 70  SpO2: 100%  Weight: 170 lb 12.8 oz (77.5 kg)   Wt Readings from Last 3 Encounters:  01/07/16 170 lb 12.8 oz (77.5 kg)  11/19/15 167 lb 6.4 oz (75.9 kg)  10/11/15 162 lb 3.2 oz (73.6 kg)     PHYSICAL EXAM: General:  Well appearing. NAD HEENT: normal Neck: supple. no JVD. Carotids 2+ bilat; no bruits. No thyromegaly or nodule noted.  Cor: PMI laterally displaced. Regular rate and rhythm. No rubs, gallops, murmurs. Lungs: Clear, normal effort.  Abdomen: soft, non-tender, non-distended, no HSM. No bruits or masses. +BS    Extremities: no cyanosis, clubbing, rash, edema.  Neuro: alert & oriented x 3, cranial nerves grossly intact. moves all 4 extremities w/o difficulty. Affect pleasant.   ASSESSMENT & PLAN: 1. Chronic systolic heart failure - cath 4/17 with normal coronaries. EF 15%. ? Viral CM. --Echo today reviewed personally EF ~40% --NYHA I. Volume status stable on exam. Only takes torsemide as needed.  -- Continue coreg 6.25 mg BID.  -- Continue torsemide 10 mg ONLY AS NEEDED.  --Continue spiro 25 mg daily.  -- Increase Entresto to 49/51 --Check labs today 2. RUE swelling.  -- Resolved.  3. HLD -- Cont atorvastatin 80 mg.  Per PCP.  4. HTN --well controlled  Glori Bickers, MD 10:24 AM

## 2016-01-07 NOTE — Patient Instructions (Signed)
INCREASE Entresto to 49/51 mg, one tab twice a day  Labs today We will only contact you if something comes back abnormal or we need to make some changes. Otherwise no news is good news!   Your physician recommends that you schedule a follow-up appointment in: 6 months with Dr Haroldine Laws

## 2016-01-09 NOTE — Addendum Note (Signed)
Encounter addended by: Jolaine Artist, MD on: 01/09/2016  8:58 AM<BR>    Actions taken: Sign clinical note

## 2016-02-03 MED FILL — CARVEDILOL 6.25 MG TABLET: 6.25 | 30 days supply | Qty: 60 | Fill #3

## 2016-02-03 MED FILL — SPIRONOLACTONE 25 MG TABLET: 25 | 30 days supply | Qty: 30 | Fill #2

## 2016-02-03 MED FILL — ATORVASTATIN 80 MG TABLET: 80 | 30 days supply | Qty: 30 | Fill #5

## 2016-03-03 MED FILL — ENTRESTO 49 MG-51 MG TABLET: 49-51 | 30 days supply | Qty: 60 | Fill #1

## 2016-03-14 ENCOUNTER — Other Ambulatory Visit (HOSPITAL_COMMUNITY): Payer: Self-pay | Admitting: Internal Medicine

## 2016-03-14 MED FILL — SPIRONOLACTONE 25 MG TABLET: 25 | 30 days supply | Qty: 30 | Fill #3

## 2016-03-14 MED FILL — CARVEDILOL 6.25 MG TABLET: 6.25 | 30 days supply | Qty: 60 | Fill #0

## 2016-03-14 MED FILL — ATORVASTATIN 80 MG TABLET: 80 | 30 days supply | Qty: 30 | Fill #1

## 2016-04-04 MED FILL — ENTRESTO 49 MG-51 MG TABLET: 49-51 | 30 days supply | Qty: 60 | Fill #2

## 2016-04-17 MED FILL — ATORVASTATIN 80 MG TABLET: 80 | 30 days supply | Qty: 30 | Fill #2

## 2016-04-17 MED FILL — SPIRONOLACTONE 25 MG TABLET: 25 | 30 days supply | Qty: 30 | Fill #4

## 2016-04-17 MED FILL — CARVEDILOL 6.25 MG TABLET: 6.25 | 30 days supply | Qty: 60 | Fill #1

## 2016-05-08 MED FILL — ENTRESTO 49 MG-51 MG TABLET: 49-51 | 30 days supply | Qty: 60 | Fill #3

## 2016-05-11 ENCOUNTER — Telehealth (HOSPITAL_COMMUNITY): Payer: Self-pay | Admitting: *Deleted

## 2016-05-11 NOTE — Telephone Encounter (Signed)
Per Dr Haroldine Laws, have pt take Torsemide today and f/u with [place] tomorrow, pt aware and agreeable.  appt sch for tomorrow at 11 am w/Dr Bensimhon

## 2016-05-11 NOTE — Telephone Encounter (Signed)
Pt called reporting he has not been feeling well for past 2 days.  He states he has not been weighing himself regularly but did weigh yesterday and wt was 172, he is supposed to be under 170.  He reports he has been having SOB, even at rest at times and frequently gets dizzy, he does not check BP at home.  He is on Torsemide 10 mg PRN and states he took a dose last night and he was up urinating all night but he does not feel any better today.  Will discuss w/Dr Bensimhon and call him back.

## 2016-05-12 ENCOUNTER — Ambulatory Visit (HOSPITAL_COMMUNITY)
Admission: RE | Admit: 2016-05-12 | Discharge: 2016-05-12 | Disposition: A | Discharge status: Home / Self Care | Payer: 59 | Source: Ambulatory Visit | Attending: Internal Medicine | Admitting: Internal Medicine

## 2016-05-12 ENCOUNTER — Encounter (HOSPITAL_COMMUNITY): Payer: Self-pay | Admitting: Internal Medicine

## 2016-05-12 VITALS — BP 98/70 | HR 79 | Wt 175.0 lb

## 2016-05-12 DIAGNOSIS — I10 Essential (primary) hypertension: Secondary | ICD-10-CM | POA: Diagnosis not present

## 2016-05-12 DIAGNOSIS — I5020 Unspecified systolic (congestive) heart failure: Secondary | ICD-10-CM | POA: Insufficient documentation

## 2016-05-12 DIAGNOSIS — Z79899 Other long term (current) drug therapy: Secondary | ICD-10-CM | POA: Insufficient documentation

## 2016-05-12 DIAGNOSIS — K219 Gastro-esophageal reflux disease without esophagitis: Secondary | ICD-10-CM | POA: Insufficient documentation

## 2016-05-12 DIAGNOSIS — Z7982 Long term (current) use of aspirin: Secondary | ICD-10-CM | POA: Diagnosis not present

## 2016-05-12 DIAGNOSIS — R55 Syncope and collapse: Secondary | ICD-10-CM

## 2016-05-12 DIAGNOSIS — Z87891 Personal history of nicotine dependence: Secondary | ICD-10-CM | POA: Insufficient documentation

## 2016-05-12 DIAGNOSIS — I5022 Chronic systolic (congestive) heart failure: Secondary | ICD-10-CM | POA: Insufficient documentation

## 2016-05-12 DIAGNOSIS — Z5189 Encounter for other specified aftercare: Secondary | ICD-10-CM | POA: Insufficient documentation

## 2016-05-12 DIAGNOSIS — I110 Hypertensive heart disease with heart failure: Secondary | ICD-10-CM | POA: Diagnosis not present

## 2016-05-12 DIAGNOSIS — E785 Hyperlipidemia, unspecified: Secondary | ICD-10-CM | POA: Insufficient documentation

## 2016-05-12 LAB — CBC
HCT: 43.9 % (ref 39.0–52.0)
Hemoglobin: 15.1 g/dL (ref 13.0–17.0)
MCH: 29.6 pg (ref 26.0–34.0)
MCHC: 34.4 g/dL (ref 30.0–36.0)
MCV: 86.1 fL (ref 78.0–100.0)
PLATELETS: 250 10*3/uL (ref 150–400)
RBC: 5.1 MIL/uL (ref 4.22–5.81)
RDW: 12.2 % (ref 11.5–15.5)
WBC: 7.4 10*3/uL (ref 4.0–10.5)

## 2016-05-12 LAB — COMPREHENSIVE METABOLIC PANEL
ALT: 47 U/L (ref 17–63)
AST: 28 U/L (ref 15–41)
Albumin: 4.6 g/dL (ref 3.5–5.0)
Alkaline Phosphatase: 46 U/L (ref 38–126)
Anion gap: 10 (ref 5–15)
BILIRUBIN TOTAL: 1.6 mg/dL — AB (ref 0.3–1.2)
BUN: 19 mg/dL (ref 6–20)
CALCIUM: 10.2 mg/dL (ref 8.9–10.3)
CO2: 26 mmol/L (ref 22–32)
CREATININE: 1.23 mg/dL (ref 0.61–1.24)
Chloride: 101 mmol/L (ref 101–111)
GFR calc Af Amer: >60 mL/min (ref >60)
GFR calc non Af Amer: >60 mL/min (ref >60)
Glucose, Bld: 118 mg/dL — ABNORMAL HIGH (ref 65–99)
Potassium: 4.1 mmol/L (ref 3.5–5.1)
Sodium: 137 mmol/L (ref 135–145)
TOTAL PROTEIN: 7.7 g/dL (ref 6.5–8.1)

## 2016-05-12 MED ORDER — DIGOXIN 125 MCG PO TABS: 0.1250 mg | ORAL_TABLET | Freq: Every day | ORAL | 3 refills | Status: DC

## 2016-05-12 MED ORDER — CARVEDILOL 3.125 MG PO TABS: 3.1250 mg | ORAL_TABLET | Freq: Two times a day (BID) | ORAL | 3 refills | Status: DC

## 2016-05-12 MED ORDER — SACUBITRIL-VALSARTAN 24-26 MG PO TABS: 1.0000 | ORAL_TABLET | Freq: Two times a day (BID) | ORAL | 3 refills | Status: DC

## 2016-05-12 MED FILL — CARVEDILOL 3.125 MG TABLET: 3.125 | 30 days supply | Qty: 60 | Fill #0

## 2016-05-12 MED FILL — ENTRESTO 24 MG-26 MG TABLET: 24-26 | 30 days supply | Qty: 60 | Fill #0

## 2016-05-12 MED FILL — DIGITEK 125 MCG TABLET: 125 | 30 days supply | Qty: 30 | Fill #0

## 2016-05-12 NOTE — Progress Notes (Signed)
    ReDS Vest - 05/12/16 1200      ReDS Vest   MR  No   Fitting Posture Standing   Height Marker Tall   Ruler Value 13   Center Strip Aligned   ReDS Value 33   Anatomical Comments --  none

## 2016-05-12 NOTE — Patient Instructions (Signed)
Cardiac MRI has been ordered for you.  Decrease Coreg to 3.125 mg (1 Tablet) Two times Daily  Decrease Entresto to 24-26 mg (1 Tablet) Two times Daily  START taking Digoxin 0.125 mg (1 Tablet) Once Daily  Follow up in 3 weeks

## 2016-05-12 NOTE — Progress Notes (Signed)
Patient ID: Jerome Barnett, male   DOB: Dec 07, 1970, 46 y.o.   MRN: [id number]    ADVANCED HF CLINIC CONSULT NOTE  Primary HF: Dr. Haroldine Laws   HPI:  Jerome Barnett is a 46 year old male with h/o HL, HTN and GERD who was admitted in 4/17 with acute HF. EF 15%.  Underwent cath in 4/17 normal cors with low filling pressures and normal cardiac output. Procedure complicated by radial artery spasm requiring multiple rounds of verapamil and NTG. Patient then developed shock and was supported with norepi and milrinone transiently. Was discharged home and then readmitted several days later with hypotension in setting of volume depletion due to severe restriction of po intake.    Seen in ED 08/03/15 and found to be orthostatic. Symptoms improved with 500 cc of IVF.   He presents today for add on. At last visit (October) increased Entresto.  Weight up 5 lbs from last visit. Over past few days has been more SOB and dizzy. Did take torsemide 20 mg past two days,.  Weight at home was up to 172 lbs.  Usually closer to 165 range.  Was SOB at rest and worse with lying flat. It did not get worse with exertion, states it took his mind off of it and he actually felt better. Has felt nauseated this morning.  Did have some lightheadedness with near-syncope yesterday. States he was driving. Denies palpitations or tachycardia, did have pressure on his chest, like someone sitting on his chest at that time. No fever or chills. No cough. Working full time, was in [place] until Wednesday. Has been taking all of his medication.  States he actually feels better today.    Echo 07/04/15 LVEF 10-15%, normal RV ECHO 01/08/16 EF ~40% normal RV  RHC/LHC 06/2015  RA = 2 RV = 28/0/4 PA = 28/11 (18) PCW = 5 Fick cardiac output/index = 4.3/2.3 PVR = 3.0 WU FA sat = 94% PA sat = 60%, 63% Assessment: 1. Normal coronary arteries 2. Low filling pressures with normal cardiac output 3. Severe NICM with EF 10% by echo 3. Development  of severe hypotension and shock in cath lab due to vasodilators to treat radial artery spasm    Current Outpatient Prescriptions  Medication Sig Dispense Refill  . acetaminophen (TYLENOL) 325 MG tablet Take 650 mg by mouth every 6 (six) hours as needed for mild pain.    Marland Kitchen aspirin 81 MG chewable tablet Chew 1 tablet (81 mg total) by mouth daily. 30 tablet 3  . atorvastatin (LIPITOR) 80 MG tablet Take 1 tablet (80 mg total) by mouth daily at 6 PM. 30 tablet 5  . carvedilol (COREG) 6.25 MG tablet TAKE 1 TABLET (6.25 MG TOTAL) BY MOUTH 2 TIMES DAILY. 60 tablet 3  . sacubitril-valsartan (ENTRESTO) 49-51 MG Take 1 tablet by mouth 2 (two) times daily. 60 tablet 6  . spironolactone (ALDACTONE) 25 MG tablet Take 1 tablet (25 mg total) by mouth daily. 30 tablet 6  . torsemide (DEMADEX) 10 MG tablet Take 10 mg by mouth daily as needed (for weight >170 lbs or leg swelling). Reported on 09/29/2015     No current facility-administered medications for this encounter.     Allergies  Allergen Reactions  . Amoxicillin Swelling    Eye swelling  . Lasix [Furosemide] Swelling    Eye swelling      Social History   Social History  . Marital status: Married    Spouse name: N/A  . Number of children: N/A  .  Years of education: N/A   Occupational History  . Not on file.   Social History Main Topics  . Smoking status: Former Smoker    Packs/day: 2.00    Years: 20.00    Quit date: 03/20/2012  . Smokeless tobacco: Never Used  . Alcohol use No  . Drug use: No  . Sexual activity: Not on file   Other Topics Concern  . Not on file   Social History Narrative   Patient is married, no children.   Has pets   Works full time at [employer]       Family History  Problem Relation Age of Onset  . Hypertension Father   . Hyperlipidemia Father     Vitals:   05/12/16 1055  BP: 98/70  Pulse: 79  SpO2: 98%  Weight: 175 lb (79.4 kg)   Orthostatics -> Standing 82/68  Wt Readings from Last 3  Encounters:  05/12/16 175 lb (79.4 kg)  01/07/16 170 lb 12.8 oz (77.5 kg)  11/19/15 167 lb 6.4 oz (75.9 kg)     PHYSICAL EXAM: General:  Fatigued appearing. NAD.  HEENT: Normal Neck: supple. JVD does not appear elevated. Carotids 2+ bilat; no bruits. No thyromegaly or nodule noted.  Cor: PMI laterally displaced. RRR. No rubs, gallops, murmurs. Lungs: CTAB, normal effort.  Abdomen: soft, NT, ND, no HSM. No bruits or masses. +BS  Extremities: no cyanosis, clubbing, rash, no peripheral edema. .  Neuro: alert & oriented x 3, cranial nerves grossly intact. moves all 4 extremities w/o difficulty. Affect pleasant.   ASSESSMENT & PLAN: 1. Chronic systolic heart failure - cath 4/17 with normal coronaries. EF 15%. ? Viral CM. --Echo 12/2015 with improvement of EF ~40%. Unfortunately bedside Echo today looks closer to 25-30% range. Will order cMRI for definitive view. --NYHA II at baseline but had IIIb-IV symptoms for past few days. Feeling somewhat better this morning.   -- Volume status looks stable on exam.  -- Decrease coreg to 6.25 mg BID. Low threshold to cut back if concerned about low output.  -- Continue torsemide 10 mg as needed.   -- Continue spiro 25 mg daily.  -- Decrease Entresto to 24/26 mg BID.    -- Add digoxin 0.125 mg daily. -- Stat CMET, CBC, and BNP.  -- Reinforced importance of daily weights.   2. HLD -- Cont atorvastatin 80 mg.  Per PCP 3. HTN -- Very mildly orthostatic on exam. Meds as above.  4. Near Syncope -- Question arrhythmia vs orthostasis vs fall in EF/low output  Shirley Friar, PA-C  11:28 AM   Patient seen and examined with Oda Kilts, PA-C. We discussed all aspects of the encounter. I agree with the assessment and plan as stated above.   He presents today for add-on visit due to recurrent HF. He had previously been doing very well but recently with volume overload and hypotension.  On exam today volume looks good (confirmed by REDS = 33%).  I performed bedside echo personally and EF back down in 25% range. Will cut back carvedilol and Entresto. Restart digoxin. Continue to use torsemide as needed. Will get cMRI. If EF < 35% will need ICD. Knows to call over weekend if feeling worse.   Total time spent 35 minutes. Over half that time spent discussing above.   Kimo Bancroft,MD 3:03 PM

## 2016-05-19 MED FILL — ATORVASTATIN 80 MG TABLET: 80 | 8 days supply | Qty: 8 | Fill #3

## 2016-05-19 MED FILL — SPIRONOLACTONE 25 MG TABLET: 25 | 30 days supply | Qty: 30 | Fill #5

## 2016-05-29 ENCOUNTER — Other Ambulatory Visit (HOSPITAL_COMMUNITY): Payer: Self-pay | Admitting: Cardiology

## 2016-05-29 DIAGNOSIS — I5020 Unspecified systolic (congestive) heart failure: Secondary | ICD-10-CM

## 2016-05-29 MED ORDER — ATORVASTATIN CALCIUM 80 MG PO TABS: 80.0000 mg | ORAL_TABLET | Freq: Every day | ORAL | 3 refills | Status: DC

## 2016-05-29 MED FILL — ATORVASTATIN 80 MG TABLET: 80 | 30 days supply | Qty: 30 | Fill #0

## 2016-05-30 ENCOUNTER — Telehealth (HOSPITAL_COMMUNITY): Payer: Self-pay | Admitting: *Deleted

## 2016-05-30 NOTE — Telephone Encounter (Signed)
Pre cert pending with [employer].     Patient Name: Jerome Barnett Patient Id: [id number] Insurance Carrier: [id number]   Primary Diagnosis Code: I50.22   Description: Chronic systolic (congestive) heart failure    CPT Code 53202   Description: Heart MRI Structure/Funct WO&W   Case Number: [id number]   Review Date: 05/30/2016 11:51:07 AM  Status: Your case has been sent to clinical review. You will be notified via fax within 2 business days if additional clinical information is needed. If you wish to speak with a representative at anytime, please call [phone].

## 2016-06-01 ENCOUNTER — Telehealth (HOSPITAL_COMMUNITY): Payer: Self-pay | Admitting: *Deleted

## 2016-06-01 ENCOUNTER — Telehealth: Payer: Self-pay | Admitting: Internal Medicine

## 2016-06-01 NOTE — Telephone Encounter (Signed)
Called patient to find out what time of day he wanted his MRI.  The patient would like a morning appointment any day of the week.

## 2016-06-01 NOTE — Telephone Encounter (Signed)
Message sent to [facility] to schedule CMRI.  Approval #[id number] Valid until 07/05/16

## 2016-06-05 ENCOUNTER — Encounter: Payer: Self-pay | Admitting: Internal Medicine

## 2016-06-05 ENCOUNTER — Telehealth: Payer: Self-pay | Admitting: Internal Medicine

## 2016-06-05 NOTE — Telephone Encounter (Signed)
Called patient and gave him date, time and location of cardiac MRI.  Mailed letter to patient today.

## 2016-06-09 ENCOUNTER — Encounter (HOSPITAL_COMMUNITY): Payer: Self-pay | Admitting: *Deleted

## 2016-06-09 ENCOUNTER — Emergency Department (HOSPITAL_COMMUNITY)
Admission: EM | Admit: 2016-06-09 | Discharge: 2016-06-09 | Disposition: A | Discharge status: Home / Self Care | Payer: 59 | Attending: Emergency Medicine | Admitting: Emergency Medicine

## 2016-06-09 ENCOUNTER — Emergency Department (HOSPITAL_COMMUNITY): Payer: 59

## 2016-06-09 DIAGNOSIS — Z8673 Personal history of transient ischemic attack (TIA), and cerebral infarction without residual deficits: Secondary | ICD-10-CM | POA: Diagnosis not present

## 2016-06-09 DIAGNOSIS — J101 Influenza due to other identified influenza virus with other respiratory manifestations: Secondary | ICD-10-CM | POA: Diagnosis not present

## 2016-06-09 DIAGNOSIS — R079 Chest pain, unspecified: Secondary | ICD-10-CM

## 2016-06-09 DIAGNOSIS — Z87891 Personal history of nicotine dependence: Secondary | ICD-10-CM | POA: Diagnosis not present

## 2016-06-09 DIAGNOSIS — I110 Hypertensive heart disease with heart failure: Secondary | ICD-10-CM | POA: Diagnosis not present

## 2016-06-09 DIAGNOSIS — Z7982 Long term (current) use of aspirin: Secondary | ICD-10-CM | POA: Insufficient documentation

## 2016-06-09 DIAGNOSIS — I509 Heart failure, unspecified: Secondary | ICD-10-CM | POA: Diagnosis not present

## 2016-06-09 LAB — BASIC METABOLIC PANEL
ANION GAP: 11 (ref 5–15)
BUN: 17 mg/dL (ref 6–20)
CALCIUM: 9.2 mg/dL (ref 8.9–10.3)
CO2: 27 mmol/L (ref 22–32)
Chloride: 100 mmol/L — ABNORMAL LOW (ref 101–111)
Creatinine, Ser: 1.14 mg/dL (ref 0.61–1.24)
Glucose, Bld: 108 mg/dL — ABNORMAL HIGH (ref 65–99)
Potassium: 4 mmol/L (ref 3.5–5.1)
SODIUM: 138 mmol/L (ref 135–145)

## 2016-06-09 LAB — I-STAT TROPONIN, ED: TROPONIN I, POC: 0 ng/mL (ref 0.00–0.08)

## 2016-06-09 LAB — CBC
HCT: 41.7 % (ref 39.0–52.0)
HEMOGLOBIN: 14.5 g/dL (ref 13.0–17.0)
MCH: 29.7 pg (ref 26.0–34.0)
MCHC: 34.8 g/dL (ref 30.0–36.0)
MCV: 85.5 fL (ref 78.0–100.0)
Platelets: 221 10*3/uL (ref 150–400)
RBC: 4.88 MIL/uL (ref 4.22–5.81)
RDW: 12.5 % (ref 11.5–15.5)
WBC: 4.8 10*3/uL (ref 4.0–10.5)

## 2016-06-09 NOTE — ED Triage Notes (Addendum)
Pt reports recent flu. Onset last night of dizziness, chest pains and sob. Also has hx of chf, denies leg swelling. Denies recent cough.  ekg done at triage, and airway intact.

## 2016-06-09 NOTE — ED Provider Notes (Signed)
[facility] DEPT Provider Note   CSN: [id number] Arrival date & time: 06/09/16  1146     History   Chief Complaint Chief Complaint  Patient presents with  . Chest Pain    HPI Jerome Barnett is a 46 y.o. male.  Here for evaluation of both left and right chest pain which started yesterday and has resolved spontaneously.  He was ill over the weekend, with fever chills and cough.  The symptoms have resolved.  He was diagnosed with influenza B, 3 days ago.  That time it was felt that he was out of the window for Tamiflu, so it was not given.  He has had decreased appetite for several days and has not been drinking as much fluid as usual.  He is followed by cardiology, for probable viral mediated congestive heart failure.  He denies diaphoresis, anterior chest pain, weakness or dizziness at this time.  There are no other known modifying factors.    HPI  Past Medical History:  Diagnosis Date  . CHF (congestive heart failure) [facility])     Patient Active Problem List   Diagnosis Date Noted  . Near syncope 05/12/2016  . Essential hypertension 09/09/2015  . Heart failure with reduced ejection fraction, NYHA class II ([place]) 07/20/2015  . Compound nevus 07/20/2015  . Swelling of right upper extremity 07/19/2015  . Hyperlipidemia   . TIA (transient ischemic attack) 07/02/2015    Past Surgical History:  Procedure Laterality Date  . CARDIAC CATHETERIZATION N/A 07/05/2015   Procedure: Right/Left Heart Cath and Coronary Angiography;  Surgeon: Jolaine Artist, MD;  Location: [place] [facility] CV LAB;  Service: Cardiovascular;  Laterality: N/A;       Home Medications    Prior to Admission medications   Medication Sig Start Date End Date Taking? Authorizing Provider  acetaminophen (TYLENOL) 325 MG tablet Take 650 mg by mouth every 6 (six) hours as needed for mild pain.    Historical Provider, MD  aspirin 81 MG chewable tablet Chew 1 tablet (81 mg total) by mouth daily. 07/08/15    Iline Oven, MD  atorvastatin (LIPITOR) 80 MG tablet Take 1 tablet (80 mg total) by mouth daily at 6 PM. 05/29/16   Jolaine Artist, MD  carvedilol (COREG) 3.125 MG tablet Take 1 tablet (3.125 mg total) by mouth 2 (two) times daily with a meal. 05/12/16   Jolaine Artist, MD  digoxin (LANOXIN) 0.125 MG tablet Take 1 tablet (0.125 mg total) by mouth daily. 05/12/16   Shaune Pascal Bensimhon, MD  sacubitril-valsartan (ENTRESTO) 24-26 MG Take 1 tablet by mouth 2 (two) times daily. 05/12/16   Jolaine Artist, MD  spironolactone (ALDACTONE) 25 MG tablet Take 1 tablet (25 mg total) by mouth daily. 11/19/15   Jolaine Artist, MD  torsemide (DEMADEX) 10 MG tablet Take 10 mg by mouth daily as needed (for weight >170 lbs or leg swelling). Reported on 09/29/2015    Historical Provider, MD    Family History Family History  Problem Relation Age of Onset  . Hypertension Father   . Hyperlipidemia Father     Social History Social History  Substance Use Topics  . Smoking status: Former Smoker    Packs/day: 2.00    Years: 20.00    Quit date: 03/20/2012  . Smokeless tobacco: Never Used  . Alcohol use No     Allergies   Amoxicillin and Lasix [furosemide]   Review of Systems Review of Systems  All other systems reviewed and are  negative.    Physical Exam Updated Vital Signs BP 111/80   Pulse 68   Temp 97.7 F (36.5 C) (Oral)   Resp 16   SpO2 97%   Physical Exam  Constitutional: He is oriented to person, place, and time. He appears well-developed and well-nourished.  HENT:  Head: Normocephalic and atraumatic.  Right Ear: External ear normal.  Left Ear: External ear normal.  Eyes: Conjunctivae and EOM are normal. Pupils are equal, round, and reactive to light.  Neck: Normal range of motion and phonation normal. Neck supple.  Cardiovascular: Normal rate, regular rhythm and normal heart sounds.   Pulmonary/Chest: Effort normal and breath sounds normal. He exhibits no bony  tenderness.  Abdominal: Soft. There is no tenderness.  Musculoskeletal: Normal range of motion. He exhibits no edema, tenderness or deformity.  Neurological: He is alert and oriented to person, place, and time. No cranial nerve deficit or sensory deficit. He exhibits normal muscle tone. Coordination normal.  Skin: Skin is warm, dry and intact.  Psychiatric: He has a normal mood and affect. His behavior is normal. Judgment and thought content normal.  Nursing note and vitals reviewed.    ED Treatments / Results  Labs (all labs ordered are listed, but only abnormal results are displayed) Labs Reviewed  BASIC METABOLIC PANEL - Abnormal; Notable for the following:       Result Value   Chloride 100 (*)    Glucose, Bld 108 (*)    All other components within normal limits  CBC  I-STAT TROPOININ, ED    EKG  EKG Interpretation None       Radiology Dg Chest 2 View  Result Date: 06/09/2016 CLINICAL DATA:  Chest pain for several hours, initial encounter EXAM: CHEST  2 VIEW COMPARISON:  08/03/2015 FINDINGS: The heart size and mediastinal contours are within normal limits. Both lungs are clear. The visualized skeletal structures are unremarkable. IMPRESSION: No active cardiopulmonary disease. Electronically Signed   By: Inez Catalina M.D.   On: 06/09/2016 12:34    Procedures Procedures (including critical care time)  Medications Ordered in ED Medications - No data to display   Initial Impression / Assessment and Plan / ED Course  I have reviewed the triage vital signs and the nursing notes.  Pertinent labs & imaging results that were available during my care of the patient were reviewed by me and considered in my medical decision making (see chart for details).     Medications - No data to display  Patient Vitals for the past 24 hrs:  BP Temp Temp src Pulse Resp SpO2  06/09/16 1445 111/80 - - 68 16 97 %  06/09/16 1351 102/73 97.7 F (36.5 C) Oral 81 - 100 %  06/09/16 1150  106/75 98.6 F (37 C) Oral 88 20 100 %    At discharge reevaluation with update and discussion. After initial assessment and treatment, an updated evaluation reveals no change in clinical status.  Findings discussed with patient and family member, all questions answered. Darrien Belter L    Final Clinical Impressions(s) / ED Diagnoses   Final diagnoses:  Nonspecific chest pain  Influenza B    Nonspecific chest pain, with reassuring exam.  No clear cause, but is likely directly related to recent illness, influenza B.  Doubt exacerbation of chronic congestive heart failure, ACS, PE or pneumonia.  Nursing Notes Reviewed/ Care Coordinated Applicable Imaging Reviewed Interpretation of Laboratory Data incorporated into ED treatment  The patient appears reasonably screened and/or stabilized for  discharge and I doubt any other medical condition or other [facility] requiring further screening, evaluation, or treatment in the ED at this time prior to discharge.  Plan: Home Medications-APAP; Home Treatments-rest fluids regular diet; return here if the recommended treatment, does not improve the symptoms; Recommended follow up-PCP, as needed   New Prescriptions New Prescriptions   No medications on file     Daleen Bo, MD 06/09/16 1555

## 2016-06-09 NOTE — Discharge Instructions (Signed)
Plenty of rest, and drink a lot of fluids.  Try to eat 3 regular meals each day.  Follow up with your primary care doctor as needed, for problems.

## 2016-06-12 ENCOUNTER — Ambulatory Visit (HOSPITAL_COMMUNITY)
Admission: RE | Admit: 2016-06-12 | Discharge: 2016-06-12 | Disposition: A | Discharge status: Home / Self Care | Payer: 59 | Source: Ambulatory Visit | Attending: Internal Medicine | Admitting: Internal Medicine

## 2016-06-12 ENCOUNTER — Encounter (HOSPITAL_COMMUNITY): Payer: Self-pay | Admitting: Internal Medicine

## 2016-06-12 VITALS — BP 104/78 | HR 82 | Wt 174.5 lb

## 2016-06-12 DIAGNOSIS — Z5189 Encounter for other specified aftercare: Secondary | ICD-10-CM | POA: Insufficient documentation

## 2016-06-12 DIAGNOSIS — I110 Hypertensive heart disease with heart failure: Secondary | ICD-10-CM | POA: Diagnosis not present

## 2016-06-12 DIAGNOSIS — Z87891 Personal history of nicotine dependence: Secondary | ICD-10-CM | POA: Diagnosis not present

## 2016-06-12 DIAGNOSIS — Z7982 Long term (current) use of aspirin: Secondary | ICD-10-CM | POA: Insufficient documentation

## 2016-06-12 DIAGNOSIS — K219 Gastro-esophageal reflux disease without esophagitis: Secondary | ICD-10-CM | POA: Insufficient documentation

## 2016-06-12 DIAGNOSIS — Z79899 Other long term (current) drug therapy: Secondary | ICD-10-CM | POA: Diagnosis not present

## 2016-06-12 DIAGNOSIS — Z880 Allergy status to penicillin: Secondary | ICD-10-CM | POA: Insufficient documentation

## 2016-06-12 DIAGNOSIS — E785 Hyperlipidemia, unspecified: Secondary | ICD-10-CM | POA: Diagnosis not present

## 2016-06-12 DIAGNOSIS — I5022 Chronic systolic (congestive) heart failure: Secondary | ICD-10-CM | POA: Diagnosis not present

## 2016-06-12 NOTE — Patient Instructions (Signed)
Your physician recommends that you schedule a follow-up appointment in: 3-4 weeks

## 2016-06-12 NOTE — Addendum Note (Signed)
Encounter addended by: Scarlette Calico, RN on: 06/12/2016 11:34 AM<BR>    Actions taken: Sign clinical note

## 2016-06-12 NOTE — Progress Notes (Signed)
Patient ID: Jerome Barnett, male   DOB: 1970/04/24, 46 y.o.   MRN: [id number]    ADVANCED HF CLINIC CONSULT NOTE  Primary HF: Dr. Haroldine Laws   HPI:  Jerome Barnett is a 46 year old male with h/o HL, HTN and GERD who was admitted in 4/17 with acute HF. EF 15%.  Underwent cath in 4/17 normal cors with low filling pressures and normal cardiac output. Procedure complicated by radial artery spasm requiring multiple rounds of verapamil and NTG. Patient then developed shock and was supported with norepi and milrinone transiently. Was discharged home and then readmitted several days later with hypotension in setting of volume depletion due to severe restriction of po intake.    Seen in ED 08/03/15 and found to be orthostatic. Symptoms improved with 500 cc of IVF.   We saw him last month for add-on visit for worsening SOB.  Volume looked good (confirmed by REDS = 33%). I performed bedside echo and EF back down in 25% range. Bp was low. We cut back carvedilol and Entresto. Restarted digoxin. cMRI ordered (not done yet)  Presents today for f/u. Was diagnosed with influenzae B a week and a half ago. Got some better. But last week worse. More SOB. Fatigued. Took torsemide. But no better. Went to ER on 3/23/ Blood work and CXR fine. ECG with HR 89 .Feels very fatigued. Dizzy all the time. SOB with walking. Very tired when going up steps. No edema, orthopnea or PND.   Echo 07/04/15 LVEF 10-15%, normal RV ECHO 01/08/16 EF ~40% normal RV  RHC/LHC 06/2015  RA = 2 RV = 28/0/4 PA = 28/11 (18) PCW = 5 Fick cardiac output/index = 4.3/2.3 PVR = 3.0 WU FA sat = 94% PA sat = 60%, 63% Assessment: 1. Normal coronary arteries 2. Low filling pressures with normal cardiac output 3. Severe NICM with EF 10% by echo 3. Development of severe hypotension and shock in cath lab due to vasodilators to treat radial artery spasm    Current Outpatient Prescriptions  Medication Sig Dispense Refill  . acetaminophen  (TYLENOL) 325 MG tablet Take 650 mg by mouth every 6 (six) hours as needed for mild pain.    Marland Kitchen aspirin 81 MG chewable tablet Chew 1 tablet (81 mg total) by mouth daily. 30 tablet 3  . atorvastatin (LIPITOR) 80 MG tablet Take 1 tablet (80 mg total) by mouth daily at 6 PM. 90 tablet 3  . carvedilol (COREG) 3.125 MG tablet Take 1 tablet (3.125 mg total) by mouth 2 (two) times daily with a meal. 60 tablet 3  . digoxin (LANOXIN) 0.125 MG tablet Take 1 tablet (0.125 mg total) by mouth daily. 30 tablet 3  . sacubitril-valsartan (ENTRESTO) 24-26 MG Take 1 tablet by mouth 2 (two) times daily. 60 tablet 3  . spironolactone (ALDACTONE) 25 MG tablet Take 1 tablet (25 mg total) by mouth daily. 30 tablet 6  . torsemide (DEMADEX) 10 MG tablet Take 10 mg by mouth daily as needed (for weight >170 lbs or leg swelling). Reported on 09/29/2015     No current facility-administered medications for this encounter.     Allergies  Allergen Reactions  . Amoxicillin Swelling    Eye swelling  . Lasix [Furosemide] Swelling    Eye swelling      Social History   Social History  . Marital status: Married    Spouse name: N/A  . Number of children: N/A  . Years of education: N/A   Occupational History  .  Not on file.   Social History Main Topics  . Smoking status: Former Smoker    Packs/day: 2.00    Years: 20.00    Quit date: 03/20/2012  . Smokeless tobacco: Never Used  . Alcohol use No  . Drug use: No  . Sexual activity: Not on file   Other Topics Concern  . Not on file   Social History Narrative   Patient is married, no children.   Has pets   Works full time at [employer]       Family History  Problem Relation Age of Onset  . Hypertension Father   . Hyperlipidemia Father     Vitals:   06/12/16 1029  BP: 104/78  Pulse: 82  SpO2: 98%  Weight: 174 lb 8 oz (79.2 kg)     Wt Readings from Last 3 Encounters:  06/12/16 174 lb 8 oz (79.2 kg)  05/12/16 175 lb (79.4 kg)  01/07/16 170 lb  12.8 oz (77.5 kg)     PHYSICAL EXAM: General:  Fatigued appearing. NAD.  HEENT: Normal Neck: supple. JVP 5 Carotids 2+ bilat; no bruits. No thyromegaly or nodule noted.  Cor: PMI laterally displaced. RRR. No rubs, gallops or mumrmur Lungs: CTAB, normal effort.  No wheeze Abdomen: soft, NT, ND, no HSM. No bruits or masses. +BS  Extremities: no cyanosis, clubbing, rash, no peripheral edema. warm Neuro: alert & oriented x 3, cranial nerves grossly intact. moves all 4 extremities w/o difficulty. Affect pleasant.   ASSESSMENT & PLAN: 1. Chronic systolic heart failure - cath 4/17 with normal coronaries. EF 15%. ? Viral CM. --Echo 12/2015 with improvement of EF ~40%. Bedside Echo 2/18  EF25-30% range. Pending cMRI 3/30 --He has been worse over past month or two however recently had flu. Now NYHA III -- Volume status looks stable on exam.  -- Will hold carvedilol  -- Continue torsemide 10 mg as needed.   -- Continue spiro 25 mg daily.  -- Continue Entresto 24/26 mg BID.    -- Continue digoxin 0.125 mg daily. -- Recent lab work reassuring -- I am not clear why he feels so bad currently but am worried about possibility of low output. We discussed proceeding with RHC this week or giving him another week or two to recover from the flu to see how he feels. We have decided to wait a week. If no improvement or getting worse he will contact me immediately and we will proceed with RHC. Plan MRI later this week.  -- CPX testing also an option 2. HLD -- Cont atorvastatin 80 mg.  PCP folllowing 3. HTN -- BP controlled.    Glori Bickers, MD  11:19 AM

## 2016-06-16 ENCOUNTER — Ambulatory Visit (HOSPITAL_COMMUNITY)
Admission: RE | Admit: 2016-06-16 | Discharge: 2016-06-16 | Disposition: A | Discharge status: Home / Self Care | Payer: 59 | Source: Ambulatory Visit | Attending: Internal Medicine | Admitting: Internal Medicine

## 2016-06-16 DIAGNOSIS — I5020 Unspecified systolic (congestive) heart failure: Secondary | ICD-10-CM | POA: Diagnosis not present

## 2016-06-16 DIAGNOSIS — I429 Cardiomyopathy, unspecified: Secondary | ICD-10-CM | POA: Diagnosis not present

## 2016-06-16 MED ORDER — GADOBENATE DIMEGLUMINE 529 MG/ML IV SOLN
24.0000 mL | Freq: Once | INTRAVENOUS | Status: AC | PRN
  Administered 2016-06-16: 24 mL via INTRAVENOUS

## 2016-06-29 ENCOUNTER — Telehealth (HOSPITAL_COMMUNITY): Payer: Self-pay | Admitting: *Deleted

## 2016-06-29 NOTE — Telephone Encounter (Signed)
MR Card Morphology Wo/W Cm  Order: [id number]  Status:  Final result Visible to patient:  Yes (MyChart) Dx:  Heart failure with reduced ejection f...  Notes recorded by Kennieth Rad, RN on 06/29/2016 at 11:46 AM EDT Patient called back and he is aware of results. Would like to talk in more detail with Dr. Haroldine Laws at his appointment next week. ------  Notes recorded by Kennieth Rad, RN on 06/27/2016 at 9:41 AM EDT Called and left message for patient to call us back. ------  Notes recorded by Jolaine Artist, MD on 06/17/2016 at 9:16 PM EDT EF improved. Please let him know.

## 2016-07-03 ENCOUNTER — Ambulatory Visit (HOSPITAL_COMMUNITY)
Admission: RE | Admit: 2016-07-03 | Discharge: 2016-07-03 | Disposition: A | Discharge status: Home / Self Care | Payer: 59 | Source: Ambulatory Visit | Attending: Internal Medicine | Admitting: Internal Medicine

## 2016-07-03 ENCOUNTER — Encounter (HOSPITAL_COMMUNITY): Payer: Self-pay

## 2016-07-03 VITALS — BP 122/88 | HR 91 | Wt 180.2 lb

## 2016-07-03 DIAGNOSIS — K219 Gastro-esophageal reflux disease without esophagitis: Secondary | ICD-10-CM | POA: Diagnosis not present

## 2016-07-03 DIAGNOSIS — E785 Hyperlipidemia, unspecified: Secondary | ICD-10-CM | POA: Insufficient documentation

## 2016-07-03 DIAGNOSIS — I5020 Unspecified systolic (congestive) heart failure: Secondary | ICD-10-CM | POA: Diagnosis not present

## 2016-07-03 DIAGNOSIS — I110 Hypertensive heart disease with heart failure: Secondary | ICD-10-CM | POA: Diagnosis not present

## 2016-07-03 DIAGNOSIS — Z79899 Other long term (current) drug therapy: Secondary | ICD-10-CM | POA: Diagnosis not present

## 2016-07-03 DIAGNOSIS — I10 Essential (primary) hypertension: Secondary | ICD-10-CM | POA: Diagnosis not present

## 2016-07-03 DIAGNOSIS — Z7982 Long term (current) use of aspirin: Secondary | ICD-10-CM | POA: Diagnosis not present

## 2016-07-03 DIAGNOSIS — Z5189 Encounter for other specified aftercare: Secondary | ICD-10-CM | POA: Insufficient documentation

## 2016-07-03 DIAGNOSIS — Z87891 Personal history of nicotine dependence: Secondary | ICD-10-CM | POA: Diagnosis not present

## 2016-07-03 DIAGNOSIS — I5022 Chronic systolic (congestive) heart failure: Secondary | ICD-10-CM | POA: Insufficient documentation

## 2016-07-03 DIAGNOSIS — Z880 Allergy status to penicillin: Secondary | ICD-10-CM | POA: Diagnosis not present

## 2016-07-03 NOTE — Patient Instructions (Signed)
No changes to medication at this time.  No lab work today.  Follow up 6 weeks with Oda Kilts PA-C.  Do the following things EVERYDAY: 1) Weigh yourself in the morning before breakfast. Write it down and keep it in a log. 2) Take your medicines as prescribed 3) Eat low salt foods-Limit salt (sodium) to 2000 mg per day.  4) Stay as active as you can everyday 5) Limit all fluids for the day to less than 2 liters

## 2016-07-03 NOTE — Progress Notes (Signed)
Patient ID: Jerome Barnett, male   DOB: [birth year], 46 y.o.   MRN: [id number]    ADVANCED HF CLINIC CONSULT NOTE  Primary HF: Dr. Haroldine Laws   HPI:  Mr Seidel is a 46 year old male with h/o HL, HTN and GERD who was admitted in 4/17 with acute HF. EF 15%.  Underwent cath in 4/17 normal cors with low filling pressures and normal cardiac output. Procedure complicated by radial artery spasm requiring multiple rounds of verapamil and NTG. Patient then developed shock and was supported with norepi and milrinone transiently. Was discharged home and then readmitted several days later with hypotension in setting of volume depletion due to severe restriction of po intake.    Seen in ED 08/03/15 and found to be orthostatic. Symptoms improved with 500 cc of IVF.   We saw him last month for add-on visit for worsening SOB.  Volume looked good (confirmed by REDS = 33%). I performed bedside echo and EF back down in 25% range. Bp was low. We cut back carvedilol and Entresto. Restarted digoxin. cMRI ordered (not done yet)  Presents today for regular follow up. Had worsening NYHA III symptoms in setting of test confirmed Influenza B. Medications adjusted.  Bedside Echo with EF 25-30% down from 40-45% range.   cMRI since then with improvement.  Feeling better.  Breathing improved, no SOB on flat ground and no longer with steps. Denies any swelling. Weight at home 170 lbs. Hasn't needed any torsemide.  Still having dizzy spells, but not as often.  Has been having some chest discomfort but not related to exertion. No fevers, chills, N/V, or diarrhea.  No orthopnea.   cMRI 06/16/16 LVEF 43%, RVEF 41%, Normal atria. Mild AR. Small area of mid wall LGE in the mid inferoseptal wall at the RV insertion site.   Echo 07/04/15 LVEF 10-15%, normal RV ECHO 01/08/16 EF ~40% normal RV  RHC/LHC 06/2015  RA = 2 RV = 28/0/4 PA = 28/11 (18) PCW = 5 Fick cardiac output/index = 4.3/2.3 PVR = 3.0 WU FA sat = 94% PA sat =  60%, 63% Assessment: 1. Normal coronary arteries 2. Low filling pressures with normal cardiac output 3. Severe NICM with EF 10% by echo 3. Development of severe hypotension and shock in cath lab due to vasodilators to treat radial artery spasm   Current Outpatient Prescriptions  Medication Sig Dispense Refill  . acetaminophen (TYLENOL) 325 MG tablet Take 650 mg by mouth every 6 (six) hours as needed for mild pain.    Marland Kitchen aspirin 81 MG chewable tablet Chew 1 tablet (81 mg total) by mouth daily. 30 tablet 3  . atorvastatin (LIPITOR) 80 MG tablet Take 1 tablet (80 mg total) by mouth daily at 6 PM. 90 tablet 3  . carvedilol (COREG) 3.125 MG tablet Take 1 tablet (3.125 mg total) by mouth 2 (two) times daily with a meal. 60 tablet 3  . digoxin (LANOXIN) 0.125 MG tablet Take 1 tablet (0.125 mg total) by mouth daily. 30 tablet 3  . sacubitril-valsartan (ENTRESTO) 24-26 MG Take 1 tablet by mouth 2 (two) times daily. 60 tablet 3  . spironolactone (ALDACTONE) 25 MG tablet Take 1 tablet (25 mg total) by mouth daily. 30 tablet 6  . torsemide (DEMADEX) 10 MG tablet Take 10 mg by mouth daily as needed (for weight >170 lbs or leg swelling). Reported on 09/29/2015     No current facility-administered medications for this encounter.     Allergies  Allergen Reactions  .  Amoxicillin Swelling    Eye swelling  . Lasix [Furosemide] Swelling    Eye swelling      Social History   Social History  . Marital status: Married    Spouse name: N/A  . Number of children: N/A  . Years of education: N/A   Occupational History  . Not on file.   Social History Main Topics  . Smoking status: Former Smoker    Packs/day: 2.00    Years: 20.00    Quit date: 03/20/2012  . Smokeless tobacco: Never Used  . Alcohol use No  . Drug use: No  . Sexual activity: Not on file   Other Topics Concern  . Not on file   Social History Narrative   Patient is married, no children.   Has pets   Works full time at [employer]       Family History  Problem Relation Age of Onset  . Hypertension Father   . Hyperlipidemia Father     Vitals:   07/03/16 0914  BP: 122/88  Pulse: 91  SpO2: 98%  Weight: 180 lb 3.2 oz (81.7 kg)    Wt Readings from Last 3 Encounters:  07/03/16 180 lb 3.2 oz (81.7 kg)  06/12/16 174 lb 8 oz (79.2 kg)  05/12/16 175 lb (79.4 kg)     PHYSICAL EXAM: General:  Well appearing asian male in NAD.  HEENT: Normal Neck: supple. JVP 6-7 Carotids 2+ bilat; no bruits. No thyromegaly or nodule noted.  Cor: PMI lateral. RRR. No M/G/R noted.  Lungs: Clear, normal effort.  Abdomen: soft, NT, ND, no HSM. No bruits or masses. +BS  Extremities: no cyanosis, clubbing, or rash. No edema. Warm to the tough.  Neuro: Alert & oriented x 3. Cranial nerves grossly intact. Moves all 4 extremities w/o difficulty. Affect pleasant    ASSESSMENT & PLAN: 1. Chronic systolic heart failure - cath 4/17 with normal coronaries. EF 15%. ? Viral CM. --Echo 12/2015 with improvement of EF ~40%. Bedside Echo 2/18  EF25-30% range. Pending cMRI 3/30 -- Improved from last visit. Now NYHA II symptoms.  -- Volume status looks stable on exam.  -- Continue to hold coreg for now.  Will try to add back gently at next visit.  -- Continue torsemide 10 mg as needed.  Weight up but volume looks stable on exam.  Instructed to try in next day or two if weight continues up or feels more SOB.  -- Continue spiro 25 mg daily.  -- Continue Entresto 24/26 mg BID.    -- Continue digoxin 0.125 mg daily. -- Recent lab work stable.  -- cMRI with stable EF at 43%.  Small area of LGE noted, will discuss with MD.  Suspect decompensation was related to his flu, as he continues to improve. Remains slightly lightheaded and fatigued at times, so will not uptitrate meds today.  -- CPX testing can be considered as needed.  2. HLD -- Cont atorvastatin 80 mg.   -- Per PCP 3. HTN -- BP controlled on current regimen.    Shirley Friar, PA-C  9:21 AM

## 2016-07-10 MED FILL — ATORVASTATIN 80 MG TABLET: 80 | 30 days supply | Qty: 30 | Fill #1

## 2016-07-10 MED FILL — CARVEDILOL 3.125 MG TABLET: 3.125 | 30 days supply | Qty: 60 | Fill #1

## 2016-07-10 MED FILL — SPIRONOLACTONE 25 MG TABLET: 25 | 30 days supply | Qty: 30 | Fill #6

## 2016-08-07 MED FILL — SPIRONOLACTONE 25 MG TABLET: 25 | 30 days supply | Qty: 15 | Fill #0

## 2016-08-07 MED FILL — ATORVASTATIN 80 MG TABLET: 80 | 30 days supply | Qty: 30 | Fill #2

## 2016-08-07 MED FILL — ENTRESTO 24 MG-26 MG TABLET: 24-26 | 30 days supply | Qty: 60 | Fill #1

## 2016-08-07 MED FILL — CARVEDILOL 3.125 MG TABLET: 3.125 | 30 days supply | Qty: 60 | Fill #2

## 2016-08-07 MED FILL — DIGOXIN 0.125 MG TABLET: 125 | 30 days supply | Qty: 30 | Fill #1

## 2016-08-15 ENCOUNTER — Ambulatory Visit (HOSPITAL_COMMUNITY)
Admission: RE | Admit: 2016-08-15 | Discharge: 2016-08-15 | Disposition: A | Discharge status: Home / Self Care | Payer: 59 | Source: Ambulatory Visit | Attending: Internal Medicine | Admitting: Internal Medicine

## 2016-08-15 VITALS — BP 126/98 | HR 76 | Wt 172.4 lb

## 2016-08-15 DIAGNOSIS — I429 Cardiomyopathy, unspecified: Secondary | ICD-10-CM | POA: Insufficient documentation

## 2016-08-15 DIAGNOSIS — I5020 Unspecified systolic (congestive) heart failure: Secondary | ICD-10-CM

## 2016-08-15 DIAGNOSIS — E785 Hyperlipidemia, unspecified: Secondary | ICD-10-CM | POA: Diagnosis not present

## 2016-08-15 DIAGNOSIS — I959 Hypotension, unspecified: Secondary | ICD-10-CM | POA: Diagnosis not present

## 2016-08-15 DIAGNOSIS — Z87891 Personal history of nicotine dependence: Secondary | ICD-10-CM | POA: Insufficient documentation

## 2016-08-15 DIAGNOSIS — Z8249 Family history of ischemic heart disease and other diseases of the circulatory system: Secondary | ICD-10-CM | POA: Diagnosis not present

## 2016-08-15 DIAGNOSIS — Z880 Allergy status to penicillin: Secondary | ICD-10-CM | POA: Diagnosis not present

## 2016-08-15 DIAGNOSIS — Z79899 Other long term (current) drug therapy: Secondary | ICD-10-CM | POA: Diagnosis not present

## 2016-08-15 DIAGNOSIS — I110 Hypertensive heart disease with heart failure: Secondary | ICD-10-CM | POA: Insufficient documentation

## 2016-08-15 DIAGNOSIS — I10 Essential (primary) hypertension: Secondary | ICD-10-CM | POA: Diagnosis not present

## 2016-08-15 DIAGNOSIS — I5022 Chronic systolic (congestive) heart failure: Secondary | ICD-10-CM | POA: Diagnosis present

## 2016-08-15 DIAGNOSIS — Z888 Allergy status to other drugs, medicaments and biological substances status: Secondary | ICD-10-CM | POA: Diagnosis not present

## 2016-08-15 DIAGNOSIS — R0602 Shortness of breath: Secondary | ICD-10-CM | POA: Diagnosis not present

## 2016-08-15 DIAGNOSIS — Z7982 Long term (current) use of aspirin: Secondary | ICD-10-CM | POA: Insufficient documentation

## 2016-08-15 DIAGNOSIS — R42 Dizziness and giddiness: Secondary | ICD-10-CM | POA: Insufficient documentation

## 2016-08-15 LAB — BASIC METABOLIC PANEL
Anion gap: 9 (ref 5–15)
BUN: 14 mg/dL (ref 6–20)
CALCIUM: 9.3 mg/dL (ref 8.9–10.3)
CHLORIDE: 105 mmol/L (ref 101–111)
CO2: 22 mmol/L (ref 22–32)
CREATININE: 0.89 mg/dL (ref 0.61–1.24)
GFR calc Af Amer: >60 mL/min (ref >60)
GFR calc non Af Amer: >60 mL/min (ref >60)
Glucose, Bld: 128 mg/dL — ABNORMAL HIGH (ref 65–99)
Potassium: 4.1 mmol/L (ref 3.5–5.1)
SODIUM: 136 mmol/L (ref 135–145)

## 2016-08-15 LAB — DIGOXIN LEVEL: DIGOXIN LVL: 0.4 ng/mL — AB (ref 0.8–2.0)

## 2016-08-15 MED ORDER — CARVEDILOL 6.25 MG PO TABS: 6.2500 mg | ORAL_TABLET | Freq: Two times a day (BID) | ORAL | 3 refills | Status: DC

## 2016-08-15 MED FILL — CARVEDILOL 6.25 MG TABLET: 6.25 | 30 days supply | Qty: 60 | Fill #0

## 2016-08-15 NOTE — Patient Instructions (Signed)
INCREASE Coreg to 6.25 mg, one tab twice day  Labs today We will only contact you if something comes back abnormal or we need to make some changes. Otherwise no news is good news!  Your physician recommends that you schedule a follow-up appointment in: 3 months with Dr Haroldine Laws   Do the following things EVERYDAY: 1) Weigh yourself in the morning before breakfast. Write it down and keep it in a log. 2) Take your medicines as prescribed 3) Eat low salt foods-Limit salt (sodium) to 2000 mg per day.  4) Stay as active as you can everyday 5) Limit all fluids for the day to less than 2 liters

## 2016-08-15 NOTE — Progress Notes (Signed)
Patient ID: Jerome Barnett, male   DOB: 09-16-1970, 46 y.o.   MRN: [id number]    ADVANCED HF CLINIC CONSULT NOTE  Primary HF: Dr. Haroldine Laws   HPI:  Jerome Barnett is a 46 year old male with h/o HL, HTN and GERD who was admitted in 4/17 with acute HF. EF 15%.  Underwent cath in 4/17 normal cors with low filling pressures and normal cardiac output. Procedure complicated by radial artery spasm requiring multiple rounds of verapamil and NTG. Patient then developed shock and was supported with norepi and milrinone transiently. Was discharged home and then readmitted several days later with hypotension in setting of volume depletion due to severe restriction of po intake.    Seen in ED 08/03/15 and found to be orthostatic. Symptoms improved with 500 cc of IVF.   Jerome Barnett saw Jerome Barnett last month for add-on visit for worsening SOB.  Volume looked good (confirmed by REDS = 33%). I performed bedside echo and EF back down in 25% range. Bp was low. Jerome Barnett cut back carvedilol and Entresto. Restarted digoxin. cMRI ordered (not done yet)  He presents today for regular follow up. Feeling great overall. Working without difficult. No problems with DOE at all.  No further episodes of weakness of fatigue. No further dizzy spells or chest discomfort. Taking all medications as directed. Hasn't needed any torsemide. No edema.  Denies lightheadedness or dizziness. No orthopnea or bendopnea.   cMRI 06/16/16 LVEF 43%, RVEF 41%, Normal atria. Mild AR. Small area of mid wall LGE in the mid inferoseptal wall at the RV insertion site.   Echo 07/04/15 LVEF 10-15%, normal RV ECHO 01/08/16 EF ~40% normal RV  RHC/LHC 06/2015  RA = 2 RV = 28/0/4 PA = 28/11 (18) PCW = 5 Fick cardiac output/index = 4.3/2.3 PVR = 3.0 WU FA sat = 94% PA sat = 60%, 63% Assessment: 1. Normal coronary arteries 2. Low filling pressures with normal cardiac output 3. Severe NICM with EF 10% by echo 3. Development of severe hypotension and shock in cath lab  due to vasodilators to treat radial artery spasm   Current Outpatient Prescriptions  Medication Sig Dispense Refill  . acetaminophen (TYLENOL) 325 MG tablet Take 650 mg by mouth every 6 (six) hours as needed for mild pain.    Marland Kitchen aspirin 81 MG chewable tablet Chew 1 tablet (81 mg total) by mouth daily. 30 tablet 3  . atorvastatin (LIPITOR) 80 MG tablet Take 1 tablet (80 mg total) by mouth daily at 6 PM. 90 tablet 3  . carvedilol (COREG) 3.125 MG tablet Take 1 tablet (3.125 mg total) by mouth 2 (two) times daily with a meal. 60 tablet 3  . digoxin (LANOXIN) 0.125 MG tablet Take 1 tablet (0.125 mg total) by mouth daily. 30 tablet 3  . sacubitril-valsartan (ENTRESTO) 24-26 MG Take 1 tablet by mouth 2 (two) times daily. 60 tablet 3  . spironolactone (ALDACTONE) 25 MG tablet Take 1 tablet (25 mg total) by mouth daily. 30 tablet 6  . torsemide (DEMADEX) 10 MG tablet Take 10 mg by mouth daily as needed (for weight >170 lbs or leg swelling). Reported on 09/29/2015     No current facility-administered medications for this encounter.     Allergies  Allergen Reactions  . Amoxicillin Swelling    Eye swelling  . Lasix [Furosemide] Swelling    Eye swelling      Social History   Social History  . Marital status: Married    Spouse name: N/A  .  Number of children: N/A  . Years of education: N/A   Occupational History  . Not on file.   Social History Main Topics  . Smoking status: Former Smoker    Packs/day: 2.00    Years: 20.00    Quit date: 03/20/2012  . Smokeless tobacco: Never Used  . Alcohol use No  . Drug use: No  . Sexual activity: Not on file   Other Topics Concern  . Not on file   Social History Narrative   Patient is married, no children.   Has pets   Works full time at [employer]       Family History  Problem Relation Age of Onset  . Hypertension Father   . Hyperlipidemia Father     Vitals:   08/15/16 0904  BP: (!) 126/98  Pulse: 76  SpO2: 100%  Weight:  172 lb 6.4 oz (78.2 kg)   Wt Readings from Last 3 Encounters:  08/15/16 172 lb 6.4 oz (78.2 kg)  07/03/16 180 lb 3.2 oz (81.7 kg)  06/12/16 174 lb 8 oz (79.2 kg)    PHYSICAL EXAM: General: Well appearing. No resp difficulty. HEENT: normal Neck: supple. JVD 5-6. Carotids 2+ bilat; no bruits. No thyromegaly or nodule noted. Cor: PMI nondisplaced. RRR, No M/G/R noted Lungs: CTAB, normal effort. Abdomen: soft, non-tender, distended, no HSM. No bruits or masses. +BS  Extremities: no cyanosis, clubbing, rash, R and LLE no edema.  Neuro: alert & orientedx3, cranial nerves grossly intact. moves all 4 extremities w/o difficulty. Affect pleasant   ASSESSMENT & PLAN: 1. Chronic systolic heart failure - cath 4/17 with normal coronaries. EF 15%. ? Viral CM. - Echo 12/2015 with improvement of EF ~40%. Bedside Echo 2/18  EF25-30% range.  - cMRI 06/16/16 LVEF 43%, RVEF 41%, Normal atria. Mild AR. Small area of mid wall LGE in the mid inferoseptal wall at the RV insertion site.  - NYHA I-II symptoms.  - Volume status stable on exam - Increase coreg to 6.25 mg BID gently, starting with evening dose. If doesn't tolerate, drop back to 3.125 mg BID.  -- Improved from last visit. Now NYHA II symptoms.  -- Volume status looks stable on exam.  -- Continue torsemide 10 mg as needed. Weight down from last visit.  -- Continue spiro 25 mg daily.  -- Continue Entresto 24/26 mg BID.   Has previously failed up-titration with marked orthostasis and lightheadedness  -- Continue digoxin 0.125 mg daily. Check level today.  -- Recent lab work stable.  -- cMRI with stable EF at 43%.  Small area of LGE noted, will discuss with MD.  Suspect decompensation was related to his flu, as he continues to improve.  -- CPX testing can be considered as needed. No indication at this time.  2. HLD - Cont atorvastatin 80 mg.   - Per change. No PCP.  3. HTN - Meds as above.   Shirley Friar, PA-C  9:05 AM   Greater  than 50% of the 25 minute visit was spent in counseling/coordination of care regarding disease state education, medication reconciliation, and discussion of medical regimen with on-site Pharm-D.

## 2016-08-15 NOTE — Progress Notes (Signed)
Advanced Heart Failure Medication Review by a Pharmacist  Does the patient  feel that his/her medications are working for him/her?  yes  Has the patient been experiencing any side effects to the medications prescribed?  no  Does the patient measure his/her own blood pressure or blood glucose at home?  no   Does the patient have any problems obtaining medications due to transportation or finances?   no  Understanding of regimen: good Understanding of indications: good Potential of compliance: good Patient understands to avoid NSAIDs. Patient understands to avoid decongestants.  Issues to address at subsequent visits: None   Pharmacist comments: Jerome Barnett is a pleasant 46 yo M presenting without a medication list but with good recall of his regimen. He reports good compliance with his regimen. He did state that he was told by PT to try ibuprofen for his shoulder pain and I recommended that he use APAP instead. No other medication-related questions or concerns for me at this time.   Ruta Hinds. Velva Harman, PharmD, BCPS, CPP Clinical Pharmacist Pager: [phone] Phone: [phone] 08/15/2016 9:09 AM      Time with patient: 10 minutes Preparation and documentation time: 2 minutes Total time: 12 minutes

## 2016-09-12 MED FILL — SPIRONOLACTONE 25 MG TABLET: 25 | 30 days supply | Qty: 15 | Fill #1

## 2016-09-12 MED FILL — ATORVASTATIN 80 MG TABLET: 80 | 30 days supply | Qty: 30 | Fill #3

## 2016-09-12 MED FILL — ENTRESTO 24 MG-26 MG TABLET: 24-26 | 30 days supply | Qty: 60 | Fill #2

## 2016-09-12 MED FILL — DIGOXIN 0.125 MG TABLET: 125 | 30 days supply | Qty: 30 | Fill #2

## 2016-10-12 MED FILL — DIGOXIN 0.125 MG TABLET: 125 | 30 days supply | Qty: 30 | Fill #3

## 2016-10-12 MED FILL — SPIRONOLACTONE 25 MG TABLET: 25 | 30 days supply | Qty: 15 | Fill #2

## 2016-10-12 MED FILL — CARVEDILOL 6.25 MG TABLET: 6.25 | 30 days supply | Qty: 60 | Fill #1

## 2016-10-12 MED FILL — ATORVASTATIN 80 MG TABLET: 80 | 30 days supply | Qty: 30 | Fill #4

## 2016-10-17 ENCOUNTER — Telehealth (HOSPITAL_COMMUNITY): Payer: Self-pay

## 2016-10-17 NOTE — Telephone Encounter (Signed)
ENTRESTO TAB 24-26MG  is approved through 10/17/2017 by [place] K. Velva Harman, PharmD, BCPS, CPP Clinical Pharmacist Pager: [phone] Phone: [phone] 10/17/2016 2:43 PM

## 2016-10-17 NOTE — Telephone Encounter (Signed)
Patient calling CHF clinic needing entresto refills (PA expired). Will forward to Ileene Patrick CHF clinical pharmD to renew.  Renee Pain, RN

## 2016-10-24 MED FILL — ENTRESTO 24 MG-26 MG TABLET: 24-26 | 30 days supply | Qty: 60 | Fill #3

## 2016-11-09 MED FILL — ATORVASTATIN 80 MG TABLET: 80 | 30 days supply | Qty: 30 | Fill #5

## 2016-11-09 MED FILL — CARVEDILOL 6.25 MG TABLET: 6.25 | 30 days supply | Qty: 60 | Fill #2

## 2016-11-13 ENCOUNTER — Other Ambulatory Visit (HOSPITAL_COMMUNITY): Payer: Self-pay | Admitting: Student

## 2016-11-14 MED FILL — SPIRONOLACTONE 25 MG TABLET: 25 | 30 days supply | Qty: 30 | Fill #0

## 2016-11-15 ENCOUNTER — Ambulatory Visit (HOSPITAL_COMMUNITY)
Admission: RE | Admit: 2016-11-15 | Discharge: 2016-11-15 | Disposition: A | Discharge status: Home / Self Care | Payer: 59 | Source: Ambulatory Visit | Attending: Internal Medicine | Admitting: Internal Medicine

## 2016-11-15 ENCOUNTER — Encounter (HOSPITAL_COMMUNITY): Payer: Self-pay | Admitting: Internal Medicine

## 2016-11-15 VITALS — BP 126/80 | HR 87 | Wt 176.5 lb

## 2016-11-15 DIAGNOSIS — Z8249 Family history of ischemic heart disease and other diseases of the circulatory system: Secondary | ICD-10-CM | POA: Diagnosis not present

## 2016-11-15 DIAGNOSIS — I2510 Atherosclerotic heart disease of native coronary artery without angina pectoris: Secondary | ICD-10-CM | POA: Diagnosis not present

## 2016-11-15 DIAGNOSIS — K219 Gastro-esophageal reflux disease without esophagitis: Secondary | ICD-10-CM | POA: Diagnosis not present

## 2016-11-15 DIAGNOSIS — I10 Essential (primary) hypertension: Secondary | ICD-10-CM

## 2016-11-15 DIAGNOSIS — E785 Hyperlipidemia, unspecified: Secondary | ICD-10-CM | POA: Insufficient documentation

## 2016-11-15 DIAGNOSIS — I429 Cardiomyopathy, unspecified: Secondary | ICD-10-CM | POA: Diagnosis not present

## 2016-11-15 DIAGNOSIS — I5020 Unspecified systolic (congestive) heart failure: Secondary | ICD-10-CM

## 2016-11-15 DIAGNOSIS — I110 Hypertensive heart disease with heart failure: Secondary | ICD-10-CM | POA: Insufficient documentation

## 2016-11-15 DIAGNOSIS — Z888 Allergy status to other drugs, medicaments and biological substances status: Secondary | ICD-10-CM | POA: Insufficient documentation

## 2016-11-15 DIAGNOSIS — Z87891 Personal history of nicotine dependence: Secondary | ICD-10-CM | POA: Diagnosis not present

## 2016-11-15 DIAGNOSIS — Z7982 Long term (current) use of aspirin: Secondary | ICD-10-CM | POA: Diagnosis not present

## 2016-11-15 DIAGNOSIS — I5022 Chronic systolic (congestive) heart failure: Secondary | ICD-10-CM | POA: Insufficient documentation

## 2016-11-15 DIAGNOSIS — R42 Dizziness and giddiness: Secondary | ICD-10-CM | POA: Diagnosis not present

## 2016-11-15 DIAGNOSIS — Z880 Allergy status to penicillin: Secondary | ICD-10-CM | POA: Insufficient documentation

## 2016-11-15 LAB — BASIC METABOLIC PANEL
Anion gap: 7 (ref 5–15)
BUN: 14 mg/dL (ref 6–20)
CALCIUM: 9.3 mg/dL (ref 8.9–10.3)
CO2: 25 mmol/L (ref 22–32)
CREATININE: 0.93 mg/dL (ref 0.61–1.24)
Chloride: 106 mmol/L (ref 101–111)
Glucose, Bld: 143 mg/dL — ABNORMAL HIGH (ref 65–99)
Potassium: 3.8 mmol/L (ref 3.5–5.1)
SODIUM: 138 mmol/L (ref 135–145)

## 2016-11-15 MED ORDER — MECLIZINE HCL 25 MG PO TABS: 25.0000 mg | ORAL_TABLET | Freq: Three times a day (TID) | ORAL | Status: DC | PRN

## 2016-11-15 NOTE — Addendum Note (Signed)
Encounter addended by: Shirley Muscat, RN on: 11/15/2016 10:06 AM<BR>    Actions taken: Medication long-term status modified, Visit diagnoses modified, Order list changed, Sign clinical note, Diagnosis association updated

## 2016-11-15 NOTE — Patient Instructions (Addendum)
Stop taking Digoxin  Start Meclizine 25 mg (1 tab) as needed every 8 hours  Labs drawn today  .Your physician recommends that you schedule a follow-up appointment in: 4 months with echo Your physician has requested that you have an echocardiogram. Echocardiography is a painless test that uses sound waves to create images of your heart. It provides your doctor with information about the size and shape of your heart and how well your heart's chambers and valves are working. This procedure takes approximately one hour. There are no restrictions for this procedure.

## 2016-11-15 NOTE — Progress Notes (Signed)
Patient ID: Jerome Barnett, male   DOB: Apr 08, 1970, 46 y.o.   MRN: [id number]    ADVANCED HF CLINIC CONSULT NOTE  Primary HF: Dr. Haroldine Laws   HPI:  Jerome Barnett is a 46 year old male with h/o HL, HTN and GERD who was admitted in 4/17 with acute HF. EF 15%.  Underwent cath in 4/17 normal cors with low filling pressures and normal cardiac output. Procedure complicated by radial artery spasm requiring multiple rounds of verapamil and NTG. Patient then developed shock and was supported with norepi and milrinone transiently. Was discharged home and then readmitted several days later with hypotension in setting of volume depletion due to severe restriction of po intake.    Seen in ED 08/03/15 and found to be orthostatic. Symptoms improved with 500 cc of IVF.   We saw him last month for add-on visit for worsening SOB.  Volume looked good (confirmed by REDS = 33%). I performed bedside echo and EF back down in 25% range. Bp was low. We cut back carvedilol and Entresto. Restarted digoxin. cMRI ordered (not done yet)  Presents today for f/u. Feeling pretty good. At last visit carvedilol increased to 6.25 bid. Tolerated well. Remains very active. No dyspnea. Struggles if it is really humid. No edema, orthopnea or PND. Vertigo is back.   cMRI  06/16/16  LVEF 43% RV 41% NICM    Echo 07/04/15 LVEF 10-15%, normal RV ECHO 01/08/16 EF ~40% normal RV  RHC/LHC 06/2015  RA = 2 RV = 28/0/4 PA = 28/11 (18) PCW = 5 Fick cardiac output/index = 4.3/2.3 PVR = 3.0 WU FA sat = 94% PA sat = 60%, 63% Assessment: 1. Normal coronary arteries 2. Low filling pressures with normal cardiac output 3. Severe NICM with EF 10% by echo 3. Development of severe hypotension and shock in cath lab due to vasodilators to treat radial artery spasm    Current Outpatient Prescriptions  Medication Sig Dispense Refill  . acetaminophen (TYLENOL) 325 MG tablet Take 650 mg by mouth every 6 (six) hours as needed for mild  pain.    Marland Kitchen aspirin 81 MG chewable tablet Chew 1 tablet (81 mg total) by mouth daily. 30 tablet 3  . atorvastatin (LIPITOR) 80 MG tablet Take 1 tablet (80 mg total) by mouth daily at 6 PM. 90 tablet 3  . carvedilol (COREG) 6.25 MG tablet Take 1 tablet (6.25 mg total) by mouth 2 (two) times daily with a meal. 60 tablet 3  . digoxin (LANOXIN) 0.125 MG tablet Take 1 tablet (0.125 mg total) by mouth daily. 30 tablet 3  . sacubitril-valsartan (ENTRESTO) 24-26 MG Take 1 tablet by mouth 2 (two) times daily. 60 tablet 3  . spironolactone (ALDACTONE) 25 MG tablet Take 12.5 mg by mouth daily.    Marland Kitchen torsemide (DEMADEX) 10 MG tablet Take 10 mg by mouth daily as needed (for weight >170 lbs or leg swelling). Reported on 09/29/2015     No current facility-administered medications for this encounter.     Allergies  Allergen Reactions  . Amoxicillin Swelling    Eye swelling  . Lasix [Furosemide] Swelling    Eye swelling      Social History   Social History  . Marital status: Married    Spouse name: N/A  . Number of children: N/A  . Years of education: N/A   Occupational History  . Not on file.   Social History Main Topics  . Smoking status: Former Smoker    Packs/day: 2.00  Years: 20.00    Quit date: 03/20/2012  . Smokeless tobacco: Never Used  . Alcohol use No  . Drug use: No  . Sexual activity: Not on file   Other Topics Concern  . Not on file   Social History Narrative   Patient is married, no children.   Has pets   Works full time at [employer]       Family History  Problem Relation Age of Onset  . Hypertension Father   . Hyperlipidemia Father     Vitals:   11/15/16 0859  BP: 126/80  Pulse: 87  SpO2: 97%  Weight: 176 lb 8 oz (80.1 kg)     Wt Readings from Last 3 Encounters:  11/15/16 176 lb 8 oz (80.1 kg)  08/15/16 172 lb 6.4 oz (78.2 kg)  07/03/16 180 lb 3.2 oz (81.7 kg)     PHYSICAL EXAM: General:  Well appearing. No resp difficulty HEENT:  normal Neck: supple. no JVD. Carotids 2+ bilat; no bruits. No lymphadenopathy or thryomegaly appreciated. Cor: PMI nondisplaced. Regular rate & rhythm. No rubs, gallops or murmurs. Lungs: clear Abdomen: soft, nontender, nondistended. No hepatosplenomegaly. No bruits or masses. Good bowel sounds. Extremities: no cyanosis, clubbing, rash, edema Neuro: alert & orientedx3, cranial nerves grossly intact. moves all 4 extremities w/o difficulty. Affect pleasant   ASSESSMENT & PLAN: 1. Chronic systolic heart failure - cath 4/17 with normal coronaries. EF 15%. ? Viral CM. - Echo 12/2015 with improvement of EF ~40%. Bedside Echo 2/18  EF25-30% range. cMRI 3/30 LVER 43% RV normal  - Volume status looks great. Not taking torsemide - Only taking torsemide prn    - Continue spiro 25 mg daily.  - Continue Entresto 24/26 mg BID.    - Continue carvedilol 6.25 bid - Stop digoxin  - EF improving. NYHA II. cMRI reassuring. Continue current therapy 2. HLD - Cont atorvastatin 80 mg.  PCP folllowing 3. HTN - Blood pressure well controlled. Continue current regimen. 4. VERTIGO - start meclizine  Glori Bickers, MD  9:32 AM

## 2016-11-24 ENCOUNTER — Other Ambulatory Visit (HOSPITAL_COMMUNITY): Payer: Self-pay | Admitting: Internal Medicine

## 2016-11-24 MED FILL — ENTRESTO 24 MG-26 MG TABLET: 24-26 | 30 days supply | Qty: 60 | Fill #0

## 2016-12-15 MED FILL — CARVEDILOL 6.25 MG TABLET: 6.25 | 30 days supply | Qty: 60 | Fill #3

## 2016-12-15 MED FILL — SPIRONOLACTONE 25 MG TABLET: 25 | 30 days supply | Qty: 30 | Fill #1

## 2016-12-15 MED FILL — ATORVASTATIN 80 MG TABLET: 80 | 30 days supply | Qty: 30 | Fill #6

## 2016-12-26 MED FILL — ENTRESTO 24 MG-26 MG TABLET: 24-26 | 30 days supply | Qty: 60 | Fill #1

## 2017-01-17 MED FILL — ATORVASTATIN 80 MG TABLET: 80 | 30 days supply | Qty: 30 | Fill #7

## 2017-01-17 MED FILL — CARVEDILOL 6.25 MG TABLET: 6.25 | 30 days supply | Qty: 60 | Fill #2

## 2017-01-17 MED FILL — SPIRONOLACTONE 25 MG TABLET: 25 | 30 days supply | Qty: 30 | Fill #2

## 2017-01-26 ENCOUNTER — Other Ambulatory Visit (HOSPITAL_COMMUNITY): Payer: Self-pay | Admitting: Internal Medicine

## 2017-01-26 MED FILL — ENTRESTO 24 MG-26 MG TABLET: 24-26 | 30 days supply | Qty: 60 | Fill #2

## 2017-01-30 ENCOUNTER — Other Ambulatory Visit (HOSPITAL_COMMUNITY): Payer: Self-pay | Admitting: Internal Medicine

## 2017-02-16 MED FILL — ATORVASTATIN 80 MG TABLET: 80 | 30 days supply | Qty: 30 | Fill #8

## 2017-02-16 MED FILL — CARVEDILOL 6.25 MG TABLET: 6.25 | 30 days supply | Qty: 60 | Fill #3

## 2017-02-16 MED FILL — SPIRONOLACTONE 25 MG TABLET: 25 | 30 days supply | Qty: 30 | Fill #3

## 2017-02-28 MED FILL — ENTRESTO 24 MG-26 MG TABLET: 24-26 | 30 days supply | Qty: 60 | Fill #3

## 2017-03-14 ENCOUNTER — Ambulatory Visit (HOSPITAL_COMMUNITY)
Admission: RE | Admit: 2017-03-14 | Discharge: 2017-03-14 | Disposition: A | Discharge status: Home / Self Care | Payer: 59 | Source: Ambulatory Visit | Attending: Internal Medicine | Admitting: Internal Medicine

## 2017-03-14 ENCOUNTER — Encounter (HOSPITAL_COMMUNITY): Payer: Self-pay | Admitting: Internal Medicine

## 2017-03-14 VITALS — BP 106/78 | HR 79 | Wt 184.0 lb

## 2017-03-14 DIAGNOSIS — R42 Dizziness and giddiness: Secondary | ICD-10-CM | POA: Diagnosis not present

## 2017-03-14 DIAGNOSIS — I5020 Unspecified systolic (congestive) heart failure: Secondary | ICD-10-CM | POA: Diagnosis not present

## 2017-03-14 DIAGNOSIS — Z8249 Family history of ischemic heart disease and other diseases of the circulatory system: Secondary | ICD-10-CM | POA: Diagnosis not present

## 2017-03-14 DIAGNOSIS — Z79899 Other long term (current) drug therapy: Secondary | ICD-10-CM | POA: Insufficient documentation

## 2017-03-14 DIAGNOSIS — I5022 Chronic systolic (congestive) heart failure: Secondary | ICD-10-CM | POA: Insufficient documentation

## 2017-03-14 DIAGNOSIS — R0602 Shortness of breath: Secondary | ICD-10-CM | POA: Diagnosis not present

## 2017-03-14 DIAGNOSIS — I110 Hypertensive heart disease with heart failure: Secondary | ICD-10-CM | POA: Insufficient documentation

## 2017-03-14 DIAGNOSIS — K219 Gastro-esophageal reflux disease without esophagitis: Secondary | ICD-10-CM | POA: Diagnosis not present

## 2017-03-14 DIAGNOSIS — I429 Cardiomyopathy, unspecified: Secondary | ICD-10-CM | POA: Insufficient documentation

## 2017-03-14 DIAGNOSIS — Z7982 Long term (current) use of aspirin: Secondary | ICD-10-CM | POA: Diagnosis not present

## 2017-03-14 DIAGNOSIS — Z888 Allergy status to other drugs, medicaments and biological substances status: Secondary | ICD-10-CM | POA: Insufficient documentation

## 2017-03-14 DIAGNOSIS — E785 Hyperlipidemia, unspecified: Secondary | ICD-10-CM | POA: Insufficient documentation

## 2017-03-14 DIAGNOSIS — I959 Hypotension, unspecified: Secondary | ICD-10-CM | POA: Insufficient documentation

## 2017-03-14 DIAGNOSIS — Z87891 Personal history of nicotine dependence: Secondary | ICD-10-CM | POA: Insufficient documentation

## 2017-03-14 DIAGNOSIS — Z880 Allergy status to penicillin: Secondary | ICD-10-CM | POA: Insufficient documentation

## 2017-03-14 DIAGNOSIS — I10 Essential (primary) hypertension: Secondary | ICD-10-CM

## 2017-03-14 MED ORDER — PANTOPRAZOLE SODIUM 40 MG PO TBEC: 40.0000 mg | DELAYED_RELEASE_TABLET | Freq: Every day | ORAL | 3 refills | Status: DC

## 2017-03-14 NOTE — Progress Notes (Signed)
  Echocardiogram 2D Echocardiogram has been performed.  Jerome Barnett 03/14/2017, 10:02 AM

## 2017-03-14 NOTE — Patient Instructions (Addendum)
START Protonix 40 mg (1 tablet) once daily.  Follow up 4 months with Dr. Haroldine Laws. We will call you closer to this time, or you may call our office to schedule 1 month before you are due to be seen. Take all medication as prescribed the day of your appointment. Bring all medications with you to your appointment.  Do the following things EVERYDAY: 1) Weigh yourself in the morning before breakfast. Write it down and keep it in a log. 2) Take your medicines as prescribed 3) Eat low salt foods-Limit salt (sodium) to 2000 mg per day.  4) Stay as active as you can everyday 5) Limit all fluids for the day to less than 2 liters

## 2017-03-14 NOTE — Progress Notes (Signed)
Patient ID: Jerome Barnett, male   DOB: 23-Jun-1970, 46 y.o.   MRN: [id number]    ADVANCED HF CLINIC CONSULT NOTE  Primary HF: Dr. Haroldine Laws   HPI:  Jerome Barnett is a 46 year old male with h/o HL, HTN and GERD who was admitted in 4/17 with acute HF. EF 15%.  Underwent cath in 4/17 normal cors with low filling pressures and normal cardiac output. Procedure complicated by radial artery spasm requiring multiple rounds of verapamil and NTG. Patient then developed shock and was supported with norepi and milrinone transiently. Was discharged home and then readmitted several days later with hypotension in setting of volume depletion due to severe restriction of po intake.    Seen in ED 08/03/15 and found to be orthostatic. Symptoms improved with 500 cc of IVF.   We saw him last month for add-on visit for worsening SOB.  Volume looked good (confirmed by REDS = 33%). I performed bedside echo and EF back down in 25% range. Bp was low. We cut back carvedilol and Entresto. Restarted digoxin. cMRI ordered (not done yet)  Presents today for f/u. Doing very well. Vertigo has resolved. When Jerome Barnett takes his night meds. Often gets dizzy and can have some chest pressure. Denies presyncope. Having a lot of GERD but feels different. No edema, orthopnea or PND. Only taking torsemide prn.   ECHO today reviewed personally EF 40-45% (formal read 35-40%)  cMRI  06/16/16  LVEF 43% RV 41% NICM    Echo 07/04/15 LVEF 10-15%, normal RV ECHO 01/08/16 EF ~40% normal RV  RHC/LHC 06/2015  RA = 2 RV = 28/0/4 PA = 28/11 (18) PCW = 5 Fick cardiac output/index = 4.3/2.3 PVR = 3.0 WU FA sat = 94% PA sat = 60%, 63% Assessment: 1. Normal coronary arteries 2. Low filling pressures with normal cardiac output 3. Severe NICM with EF 10% by echo 3. Development of severe hypotension and shock in cath lab due to vasodilators to treat radial artery spasm    Current Outpatient Medications  Medication Sig Dispense Refill  .  acetaminophen (TYLENOL) 325 MG tablet Take 650 mg by mouth every 6 (six) hours as needed for mild pain.    Marland Kitchen aspirin 81 MG chewable tablet Chew 1 tablet (81 mg total) by mouth daily. 30 tablet 3  . atorvastatin (LIPITOR) 80 MG tablet Take 1 tablet (80 mg total) by mouth daily at 6 PM. 90 tablet 3  . carvedilol (COREG) 6.25 MG tablet Take 1 tablet (6.25 mg total) by mouth 2 (two) times daily with a meal. 60 tablet 3  . ENTRESTO 24-26 MG TAKE 1 TABLET BY MOUTH 2 (TWO) TIMES DAILY. 60 tablet 3  . spironolactone (ALDACTONE) 25 MG tablet Take 25 mg by mouth daily.    Marland Kitchen torsemide (DEMADEX) 10 MG tablet Take 10 mg by mouth daily as needed (for weight >170 lbs or leg swelling). Reported on 09/29/2015     No current facility-administered medications for this encounter.     Allergies  Allergen Reactions  . Amoxicillin Swelling    Eye swelling  . Lasix [Furosemide] Swelling    Eye swelling      Social History   Socioeconomic History  . Marital status: Married    Spouse name: Not on file  . Number of children: Not on file  . Years of education: Not on file  . Highest education level: Not on file  Social Needs  . Financial resource strain: Not on file  . Food insecurity -  worry: Not on file  . Food insecurity - inability: Not on file  . Transportation needs - medical: Not on file  . Transportation needs - non-medical: Not on file  Occupational History  . Not on file  Tobacco Use  . Smoking status: Former Smoker    Packs/day: 2.00    Years: 20.00    Pack years: 40.00    Last attempt to quit: 03/20/2012    Years since quitting: 4.9  . Smokeless tobacco: Never Used  Substance and Sexual Activity  . Alcohol use: No    Alcohol/week: 0.0 oz  . Drug use: No  . Sexual activity: Not on file  Other Topics Concern  . Not on file  Social History Narrative   Patient is married, no children.   Has pets   Works full time at [employer]       Family History  Problem Relation Age of  Onset  . Hypertension Father   . Hyperlipidemia Father     Vitals:   03/14/17 1002  BP: 106/78  Pulse: 79  SpO2: 96%  Weight: 184 lb (83.5 kg)     Wt Readings from Last 3 Encounters:  03/14/17 184 lb (83.5 kg)  11/15/16 176 lb 8 oz (80.1 kg)  08/15/16 172 lb 6.4 oz (78.2 kg)     PHYSICAL EXAM: General:  Well appearing. No resp difficulty HEENT: normal Neck: supple. no JVD. Carotids 2+ bilat; no bruits. No lymphadenopathy or thryomegaly appreciated. Cor: PMI nondisplaced. Regular rate & rhythm. No rubs, gallops or murmurs. Lungs: clear Abdomen: soft, nontender, nondistended. No hepatosplenomegaly. No bruits or masses. Good bowel sounds. Extremities: no cyanosis, clubbing, rash, edema Neuro: alert & orientedx3, cranial nerves grossly intact. moves all 4 extremities w/o difficulty. Affect pleasant  ASSESSMENT & PLAN: 1. Chronic systolic heart failure - cath 4/17 with normal coronaries. EF 15%. ? Viral CM. - Echo 12/2015 with improvement of EF ~40%. Bedside Echo 2/18  EF25-30% range. cMRI 3/30 LVEF 43% RV normal  - ECHO today reviewed personally EF 40-45% (formal read 35-40%) - Stable NYHA I-II - Volume status looks good. Not needing torsemide - Continue spiro 25 mg daily.  - Continue Entresto 24/26 mg BID.    - Continue carvedilol 6.25 bid - Unclear what's causing nighttime symptoms. GERD vs meds. Will try holding each med for a few days and see if it makes a difference  2. HLD - Cont atorvastatin 80 mg.  PCP folllowing 3. HTN - Blood pressure well controlled. Continue current regimen. 4. GERD - start protonix   Glori Bickers, MD  11:00 AM

## 2017-03-23 ENCOUNTER — Other Ambulatory Visit (HOSPITAL_COMMUNITY): Payer: Self-pay | Admitting: Internal Medicine

## 2017-03-23 MED FILL — PANTOPRAZOLE SOD DR 40 MG T: 40 | 30 days supply | Qty: 30 | Fill #0

## 2017-03-23 MED FILL — ATORVASTATIN 80 MG TABLET: 80 | 30 days supply | Qty: 30 | Fill #9

## 2017-03-23 MED FILL — SPIRONOLACTONE 25 MG TABLET: 25 | 30 days supply | Qty: 30 | Fill #4

## 2017-03-27 MED FILL — CARVEDILOL 6.25 MG TABLET: 6.25 | 30 days supply | Qty: 60 | Fill #0

## 2017-04-02 ENCOUNTER — Other Ambulatory Visit (HOSPITAL_COMMUNITY): Payer: Self-pay | Admitting: Internal Medicine

## 2017-04-02 MED FILL — ENTRESTO 24 MG-26 MG TABLET: 24-26 | 30 days supply | Qty: 60 | Fill #0

## 2017-04-23 MED FILL — PANTOPRAZOLE SOD DR 40 MG T: 40 | 30 days supply | Qty: 30 | Fill #1

## 2017-04-23 MED FILL — CARVEDILOL 6.25 MG TABLET: 6.25 | 30 days supply | Qty: 60 | Fill #1

## 2017-04-23 MED FILL — SPIRONOLACTONE 25 MG TABLET: 25 | 30 days supply | Qty: 30 | Fill #5

## 2017-04-23 MED FILL — ATORVASTATIN 80 MG TABLET: 80 | 30 days supply | Qty: 30 | Fill #10

## 2017-05-03 MED FILL — ENTRESTO 24 MG-26 MG TABLET: 24-26 | 30 days supply | Qty: 60 | Fill #1

## 2017-06-04 ENCOUNTER — Other Ambulatory Visit (HOSPITAL_COMMUNITY): Payer: Self-pay | Admitting: Internal Medicine

## 2017-06-04 DIAGNOSIS — I5020 Unspecified systolic (congestive) heart failure: Secondary | ICD-10-CM

## 2017-06-04 MED FILL — SPIRONOLACTONE 25 MG TABLET: 25 | 30 days supply | Qty: 30 | Fill #6

## 2017-06-04 MED FILL — PANTOPRAZOLE SOD DR 40 MG T: 40 | 30 days supply | Qty: 30 | Fill #2

## 2017-06-04 MED FILL — ENTRESTO 24 MG-26 MG TABLET: 24-26 | 30 days supply | Qty: 60 | Fill #2

## 2017-06-04 MED FILL — CARVEDILOL 6.25 MG TABLET: 6.25 | 30 days supply | Qty: 60 | Fill #2

## 2017-06-15 MED FILL — ATORVASTATIN 80 MG TABLET: 80 | 30 days supply | Qty: 30 | Fill #0

## 2017-07-05 ENCOUNTER — Other Ambulatory Visit (HOSPITAL_COMMUNITY): Payer: Self-pay | Admitting: Internal Medicine

## 2017-07-05 MED FILL — ENTRESTO 24 MG-26 MG TABLET: 24-26 | 30 days supply | Qty: 60 | Fill #3

## 2017-07-05 MED FILL — CARVEDILOL 6.25 MG TABLET: 6.25 | 30 days supply | Qty: 60 | Fill #3

## 2017-07-10 MED FILL — SPIRONOLACTONE 25 MG TABLET: 25 | 30 days supply | Qty: 30 | Fill #0

## 2017-07-11 MED FILL — PANTOPRAZOLE SOD DR 40 MG T: 40 | 30 days supply | Qty: 30 | Fill #3

## 2017-07-11 MED FILL — ATORVASTATIN 80 MG TABLET: 80 | 30 days supply | Qty: 30 | Fill #1

## 2017-08-06 ENCOUNTER — Other Ambulatory Visit (HOSPITAL_COMMUNITY): Payer: Self-pay | Admitting: Internal Medicine

## 2017-08-08 MED FILL — ENTRESTO 24 MG-26 MG TABLET: 24-26 | 30 days supply | Qty: 60 | Fill #0

## 2017-08-08 MED FILL — CARVEDILOL 6.25 MG TABLET: 6.25 | 30 days supply | Qty: 60 | Fill #0

## 2017-08-09 MED FILL — SPIRONOLACTONE 25 MG TABLET: 25 | 30 days supply | Qty: 30 | Fill #1

## 2017-08-09 MED FILL — ATORVASTATIN 80 MG TABLET: 80 | 30 days supply | Qty: 30 | Fill #2

## 2017-08-09 MED FILL — PANTOPRAZOLE SOD DR 40 MG T: 40 | 30 days supply | Qty: 30 | Fill #4

## 2017-08-17 DIAGNOSIS — B029 Zoster without complications: Secondary | ICD-10-CM | POA: Diagnosis not present

## 2017-08-17 DIAGNOSIS — I10 Essential (primary) hypertension: Secondary | ICD-10-CM | POA: Diagnosis not present

## 2017-09-11 MED FILL — ENTRESTO 24 MG-26 MG TABLET: 24-26 | 30 days supply | Qty: 60 | Fill #1

## 2017-09-11 MED FILL — PANTOPRAZOLE SOD DR 40 MG T: 40 | 30 days supply | Qty: 30 | Fill #5

## 2017-09-11 MED FILL — CARVEDILOL 6.25 MG TABLET: 6.25 | 30 days supply | Qty: 60 | Fill #1

## 2017-09-11 MED FILL — ATORVASTATIN 80 MG TABLET: 80 | 30 days supply | Qty: 30 | Fill #3

## 2017-09-11 MED FILL — SPIRONOLACTONE 25 MG TABLET: 25 | 30 days supply | Qty: 30 | Fill #2

## 2017-09-12 IMAGING — CT CT HEAD W/O CM
1 of 2 series · 15 of 30 positions shown, 19 images · non-contrast
Comparison: None.

CLINICAL DATA: Right upper extremity weakness for 1 day

EXAM:
CT HEAD WITHOUT CONTRAST
TECHNIQUE: Contiguous axial images were obtained from the base of the skull
through the vertex without intravenous contrast.

[Series 3: head 2.0 h70h · axial · 0.42mm/px · z∈[-133,+17]mm · 15 of 85 slices shown, 19 images]
[im 5/85  brain]
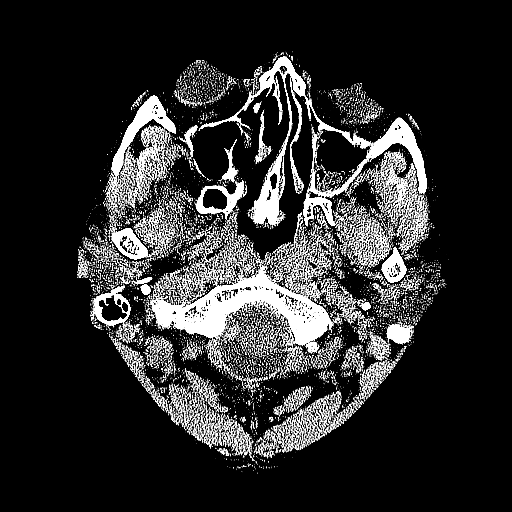
[im 5/85  bone]
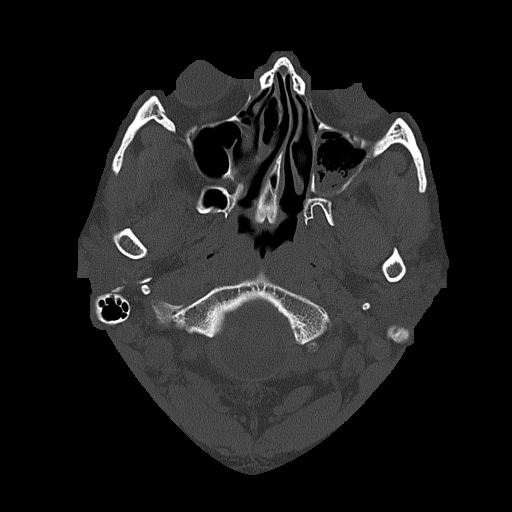
[im 9/85  brain]
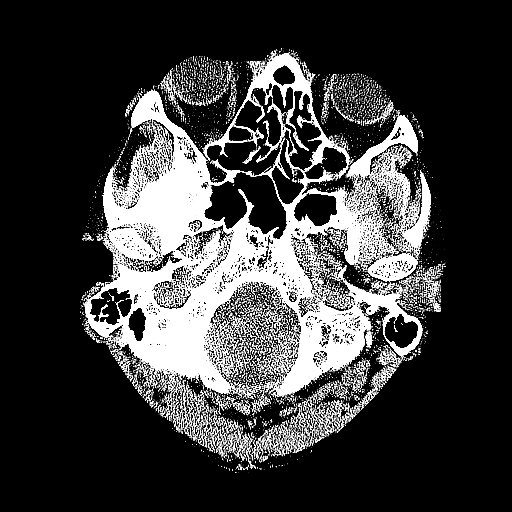
[im 17/85  brain]
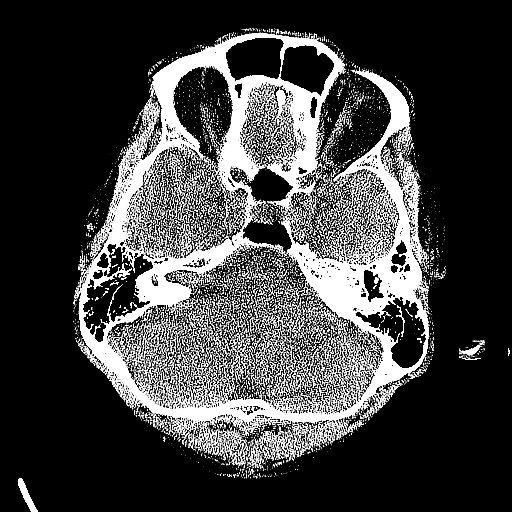
[im 22/85  brain]
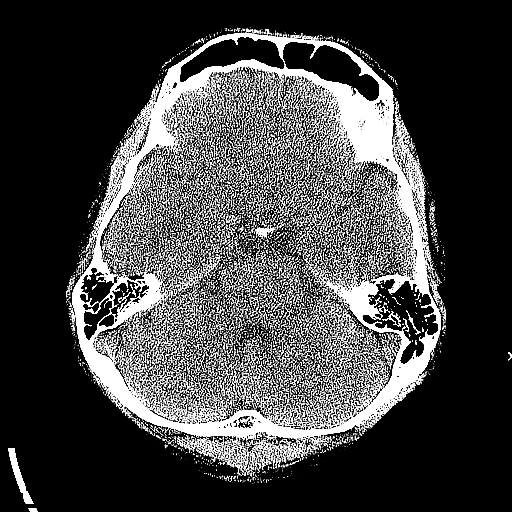
[im 26/85  brain]
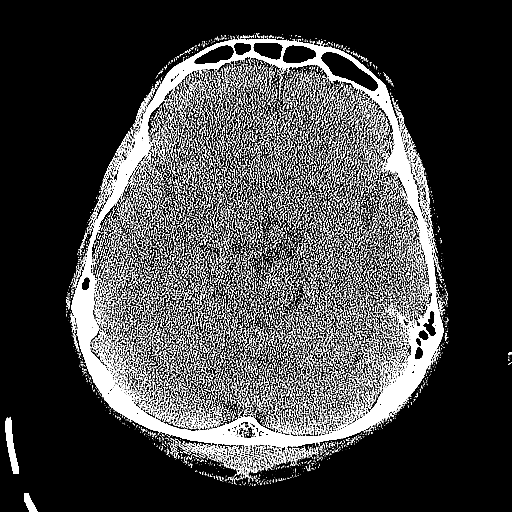
[im 26/85  bone]
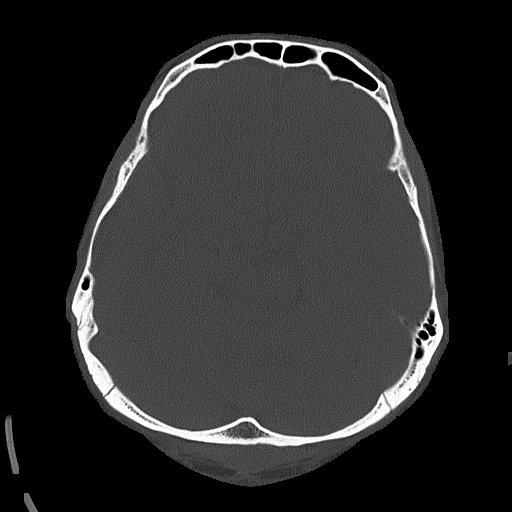
[im 30/85  brain]
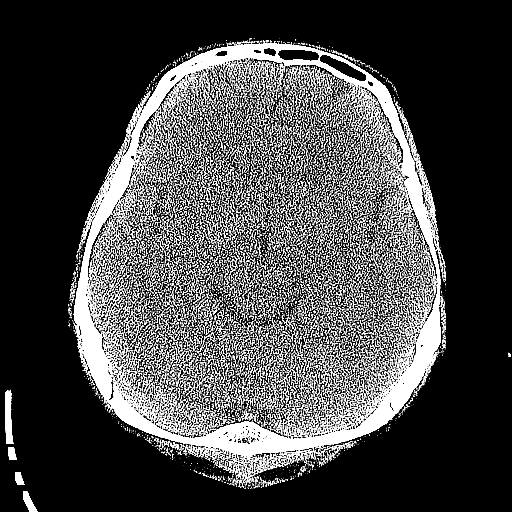
[im 38/85  brain]
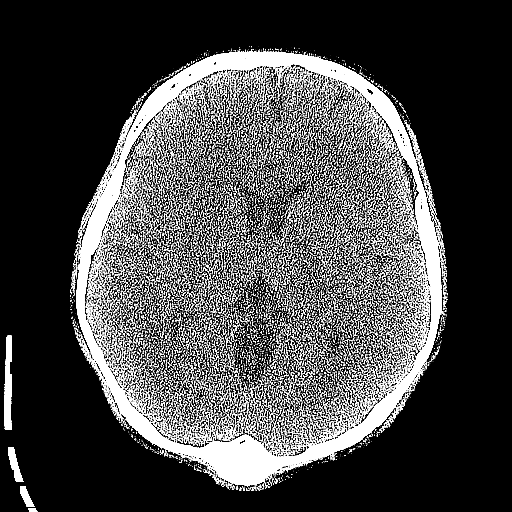
[im 43/85  brain]
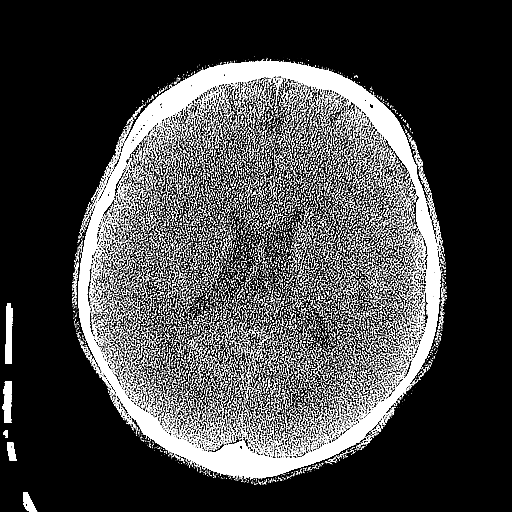
[im 47/85  brain]
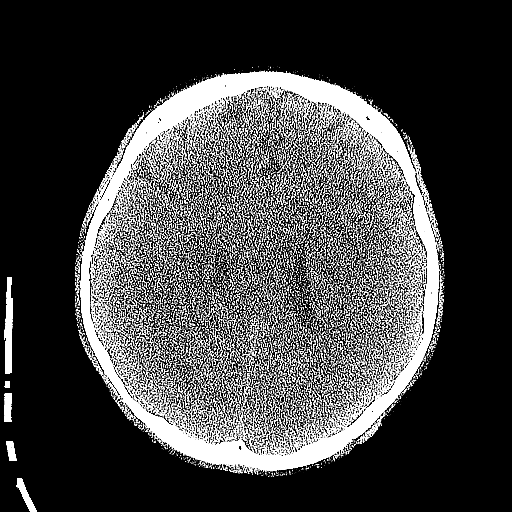
[im 47/85  bone]
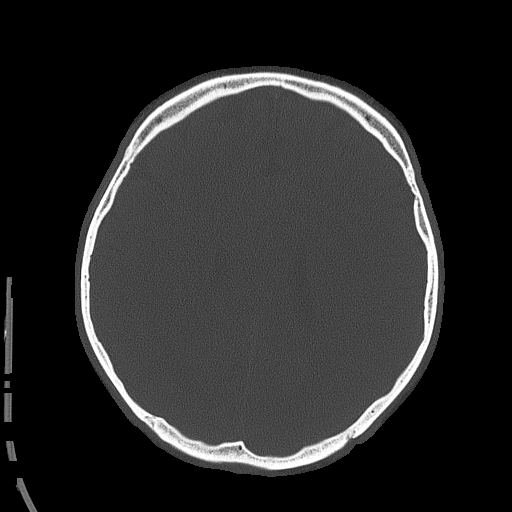
[im 55/85  brain]
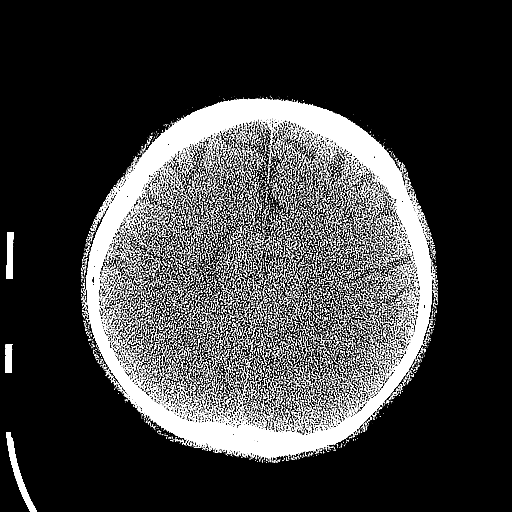
[im 59/85  brain]
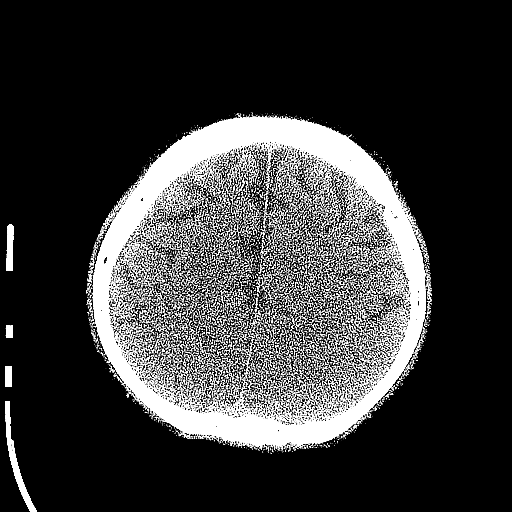
[im 64/85  brain]
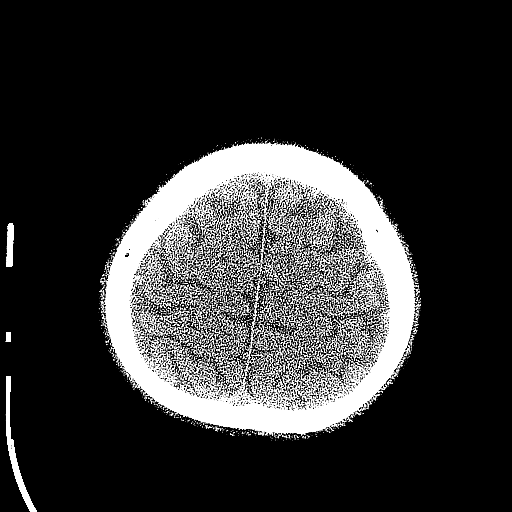
[im 68/85  brain]
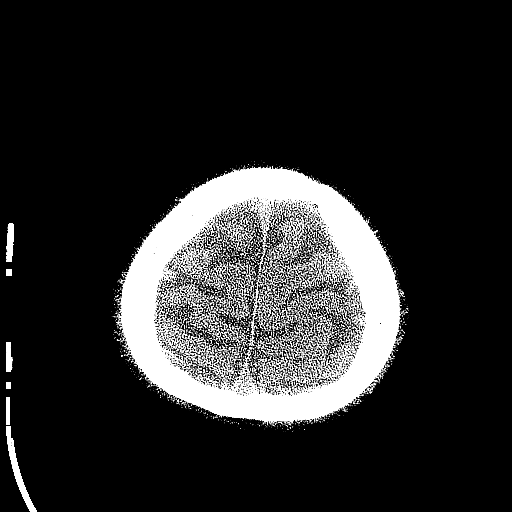
[im 68/85  bone]
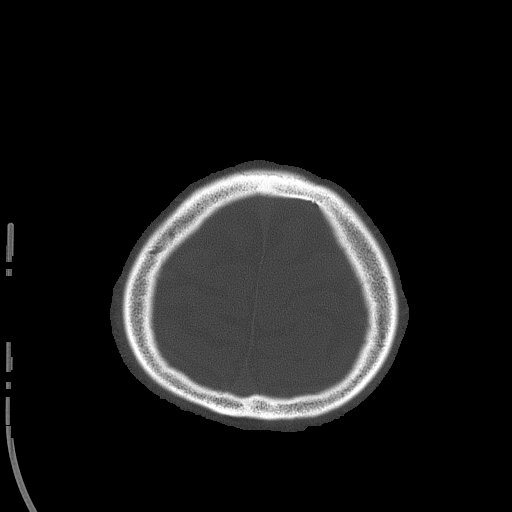
[im 76/85  brain]
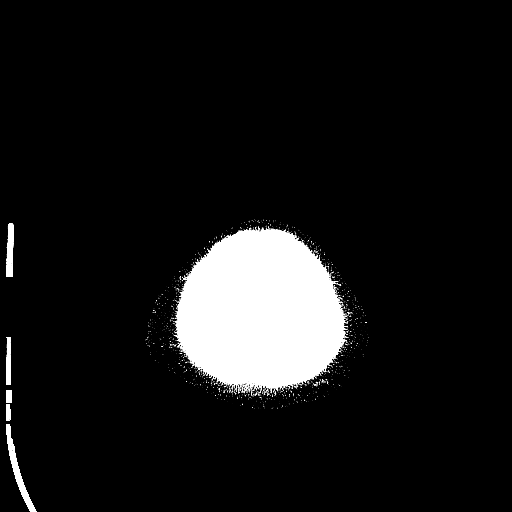
[im 80/85  brain]
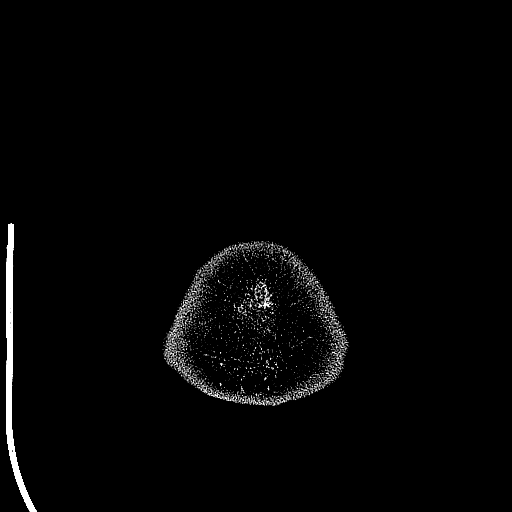

[15 of 30 positions shown; findings below may reference images not displayed]

FINDINGS: The ventricles are normal in size and configuration. There is no
intracranial mass, hemorrhage, extra-axial fluid collection, or
midline shift. The gray-white compartments are normal. There is no
acute infarct evident. Bony calvarium appears intact. Mastoid air
cells are clear. No intraorbital lesions are evident. There is
mucosal thickening in the left maxillary antrum. There is also mild
mucosal thickening of several ethmoid air cells. There is leftward
deviation of the nasal septum. There is a prominent concha bullosa
on the right, an anatomic variant.
IMPRESSION: Paranasal sinus disease, most notable in the left maxillary antrum.
No intracranial mass, hemorrhage, or focal gray-white compartment
lesions/acute appearing infarct.

## 2017-09-12 IMAGING — MR MR HEAD W/O CM
9 of 10 series · 34 of 48 positions shown · non-contrast
Comparison: CT head earlier today.

CLINICAL DATA: One week history of dizziness. Presyncope. Episodic
RIGHT arm weakness.

EXAM:
MRI HEAD WITHOUT CONTRAST
TECHNIQUE: Multiplanar, multiecho pulse sequences of the brain and surrounding
structures were obtained without intravenous contrast.

[Series 3: T1 · sagittal · 5.0mm · 0.47mm/px · 1 of 25 slices shown]
[im 1/25]
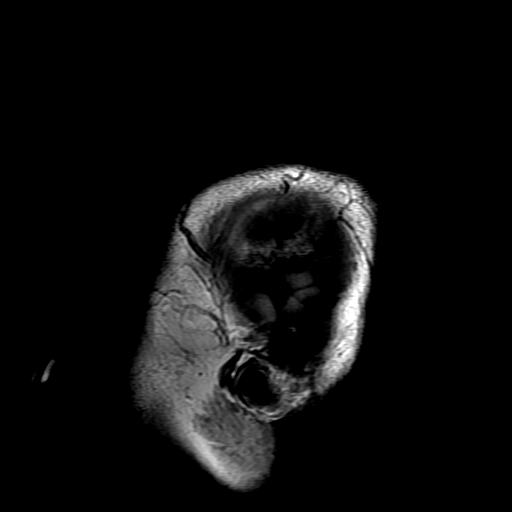

[Series 4: DWI · axial · 3.0mm · 1.09mm/px · z∈[-113,+33]mm · 8 of 100 slices shown (1 of 4)]
[im 1/100]
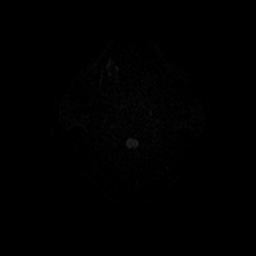
[im 12/100]
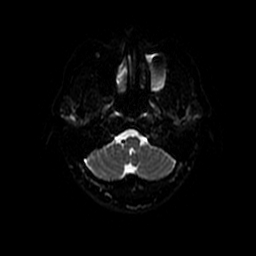
[im 34/100]
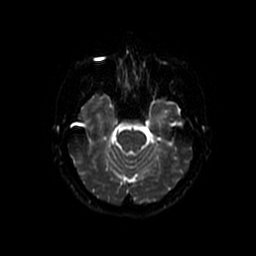
[im 45/100]
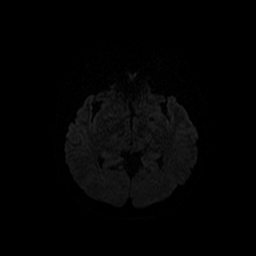
[im 56/100]
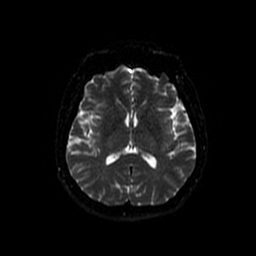
[im 67/100]
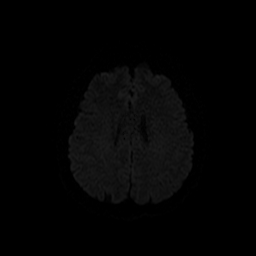
[im 89/100]
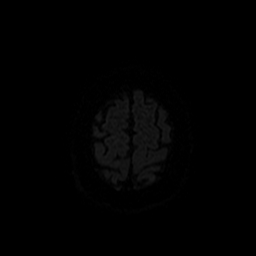
[im 100/100]
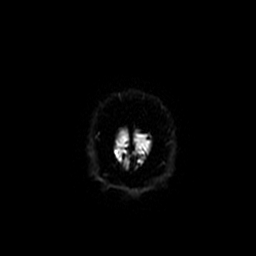

[Series 5: T2 · axial · 5.0mm · 0.45mm/px · z∈[-104,+52]mm · 3 of 27 slices shown (1 of 2)]
[im 1/27]
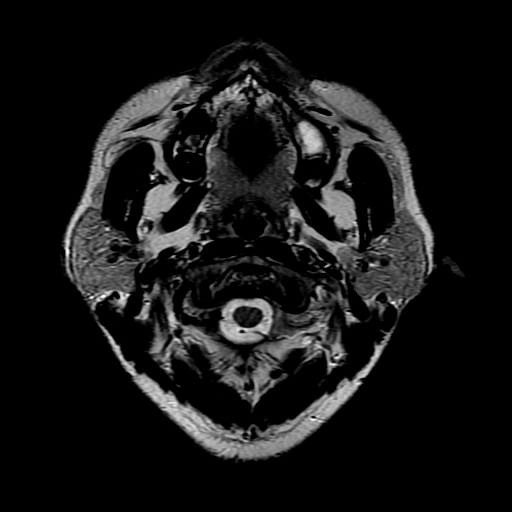
[im 14/27]
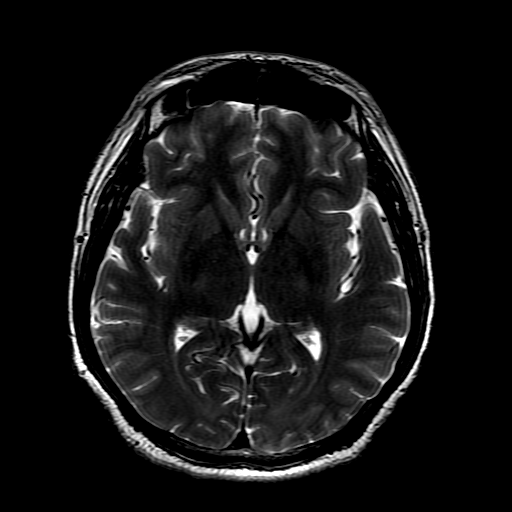
[im 27/27]
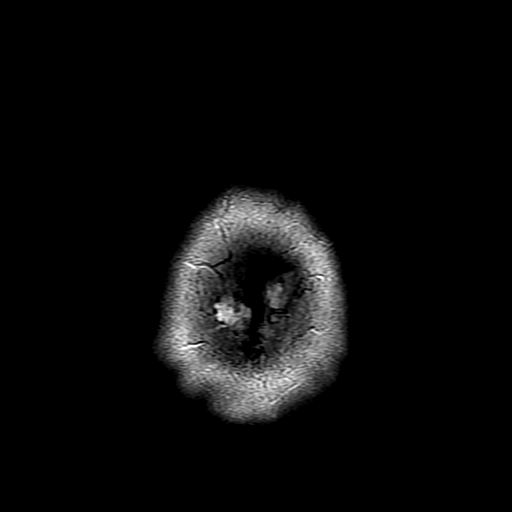

[Series 6: DWI · coronal · 5.0mm · 1.09mm/px · 6 of 66 slices shown (2 of 4)]
[im 1/66]
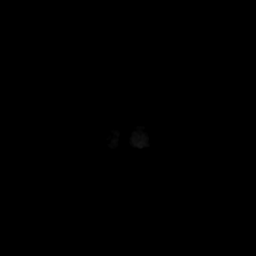
[im 14/66]
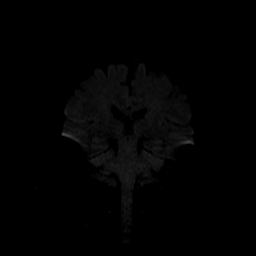
[im 27/66]
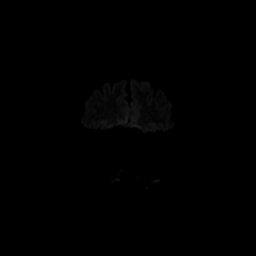
[im 40/66]
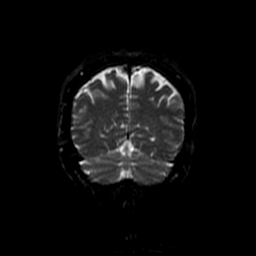
[im 53/66]
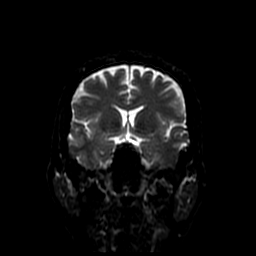
[im 66/66]
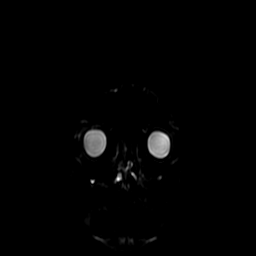

[Series 7: FLAIR · axial · 5.0mm · 0.45mm/px · z∈[-104,+52]mm · 3 of 27 slices shown]
[im 1/27]
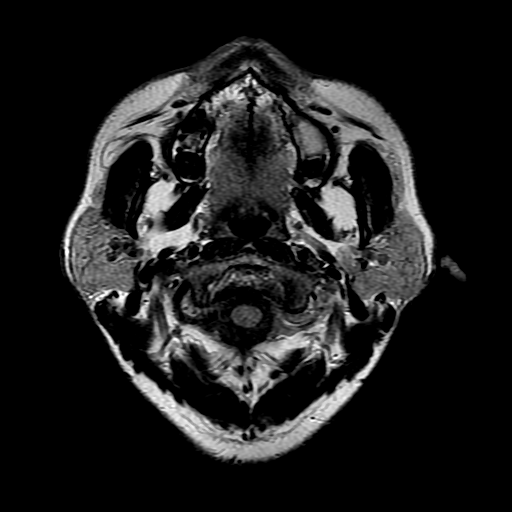
[im 14/27]
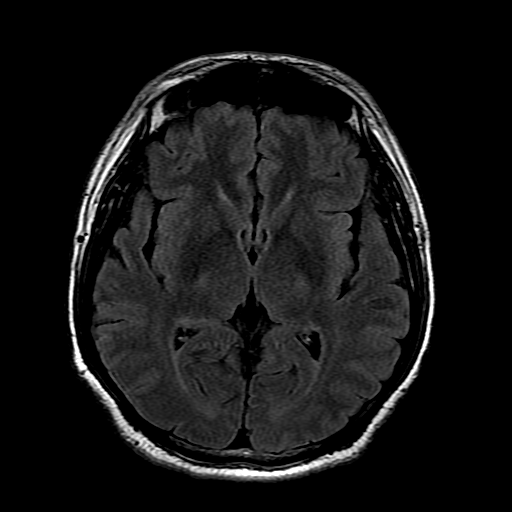
[im 27/27]
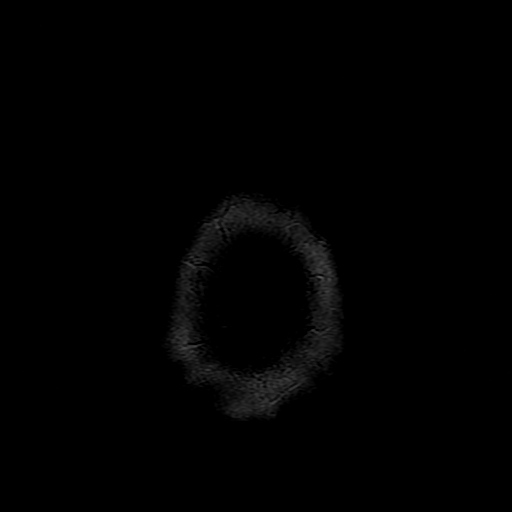

[Series 8: ax mpgr · axial · 5.0mm · 0.45mm/px · z∈[-104,-26]mm · 2 of 27 slices shown]
[im 1/27]
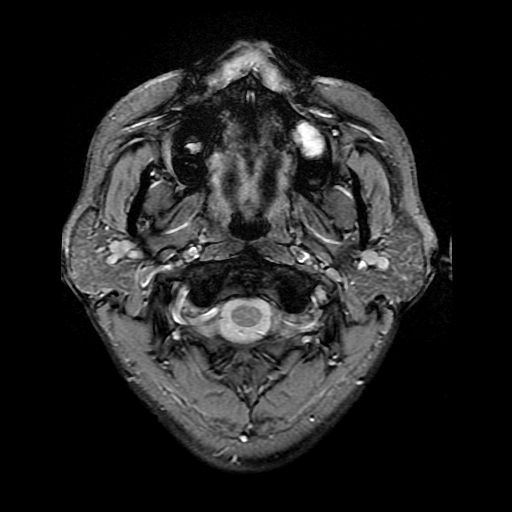
[im 14/27]
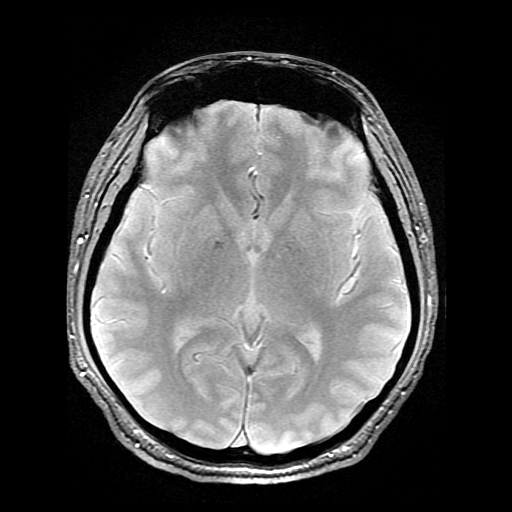

[Series 10: T2 · coronal · 5.0mm · 0.43mm/px · 3 of 28 slices shown (2 of 2)]
[im 1/28]
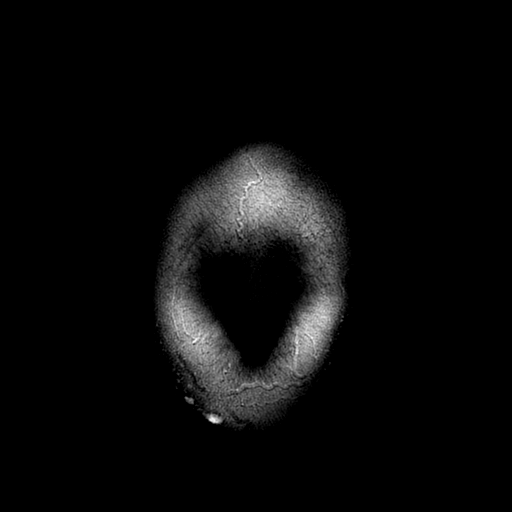
[im 14/28]
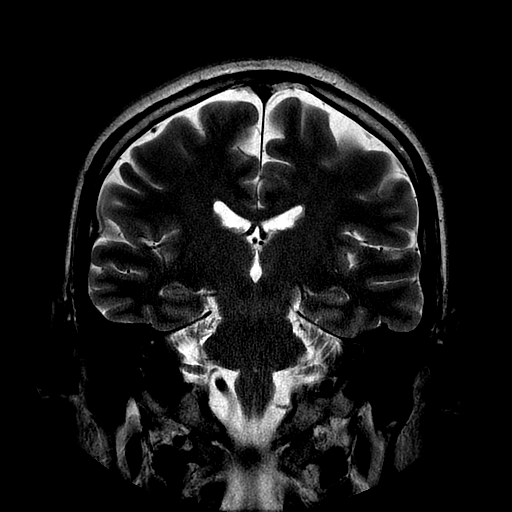
[im 28/28]
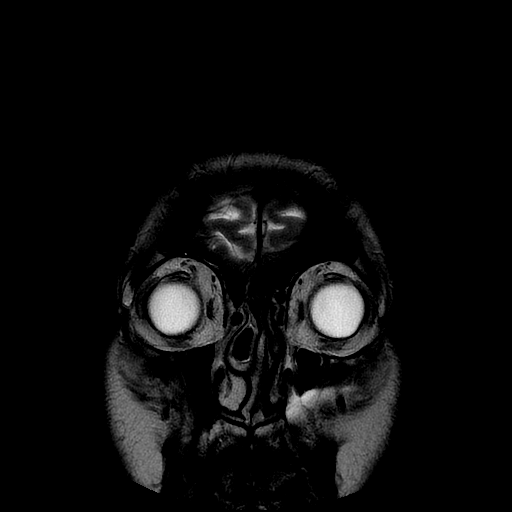

[Series 400: DWI · axial · 3.0mm · 1.09mm/px · z∈[-113,+33]mm · 5 of 50 slices shown (3 of 4)]
[im 1/50]
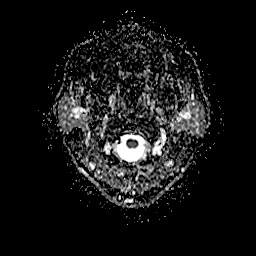
[im 13/50]
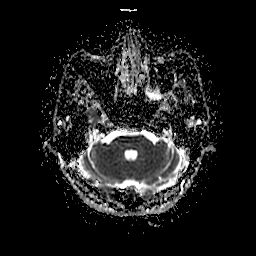
[im 25/50]
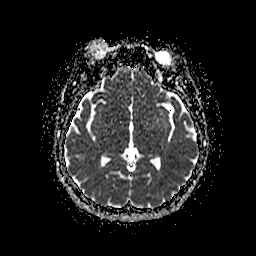
[im 37/50]
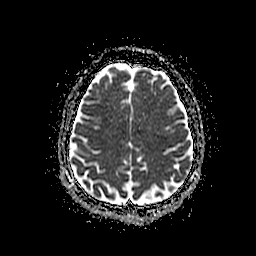
[im 50/50]
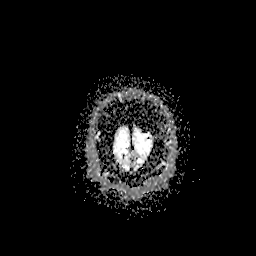

[Series 600: DWI · coronal · 5.0mm · 1.09mm/px · 3 of 33 slices shown (4 of 4)]
[im 1/33]
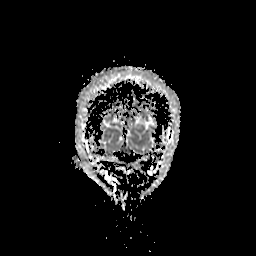
[im 17/33]
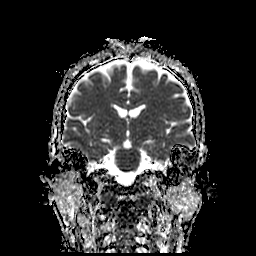
[im 33/33]
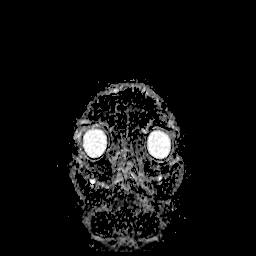

[34 of 48 positions shown; findings below may reference images not displayed]

FINDINGS: No evidence for acute infarction, hemorrhage, mass lesion,
hydrocephalus, or extra-axial fluid. Normal cerebral volume. Minor
white matter disease, no significant white matter disease.

Flow voids are maintained throughout the carotid, basilar, and
vertebral arteries. There are no areas of chronic hemorrhage.

Pituitary, pineal, and cerebellar tonsils unremarkable. No upper
cervical lesions.

Acute and chronic LEFT maxillary sinus disease. Nasal septal
deviation RIGHT to LEFT with large RIGHT concha bullosa. Negative
orbits. No mastoid or middle ear fluid. Visualized extracranial soft
tissues unremarkable.
IMPRESSION: No acute or focal intracranial abnormalities.

No evidence for large vessel occlusion.

Acute and chronic LEFT maxillary sinusitis.

## 2017-09-23 IMAGING — CR DG CHEST 1V PORT
1 series · 1 of 1 positions shown · non-contrast
Comparison: 07/06/2015.

CLINICAL DATA: PICC line.

EXAM:
PORTABLE CHEST 1 VIEW

[AP]
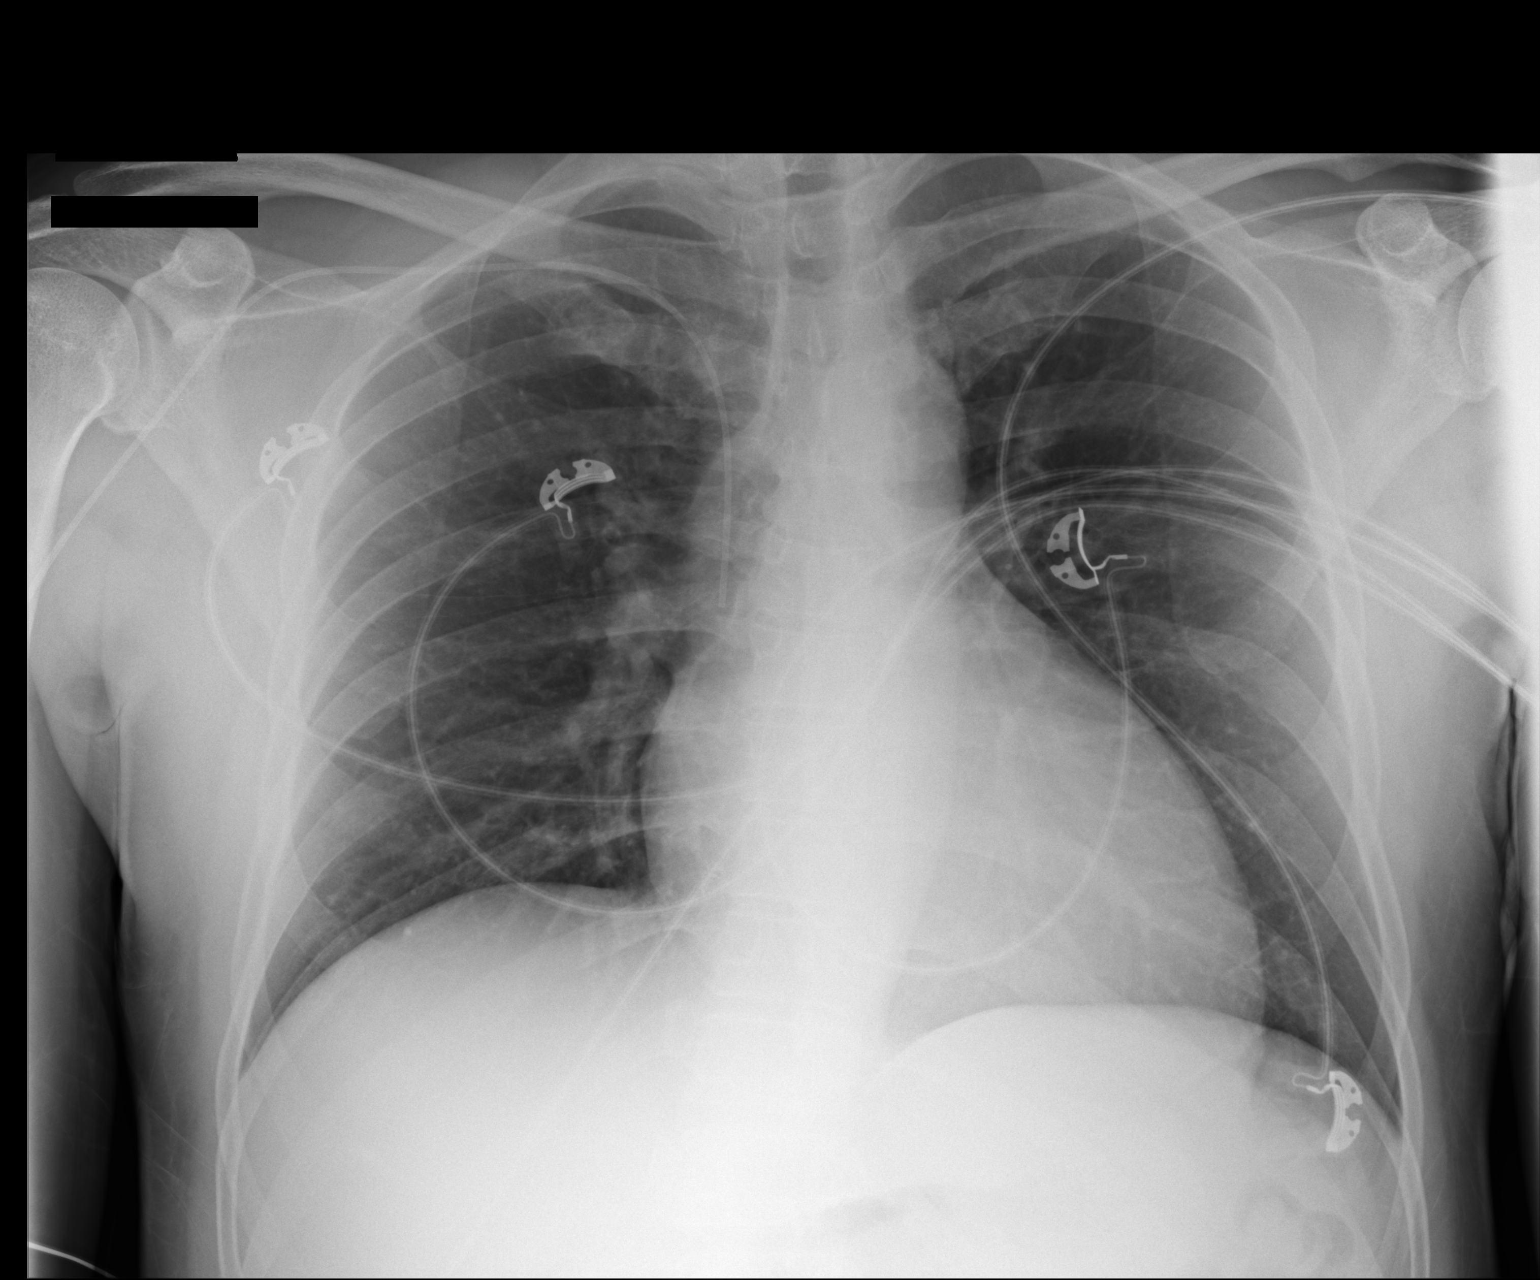

[1 of 1 positions shown; findings below may reference images not displayed]

FINDINGS: Right PICC line in stable position. mediastinum hilar structures
normal. Cardiomegaly with normal pulmonary vascularity. No focal
infiltrate. No pleural effusion or pneumothorax.
IMPRESSION: Right PICC line in good anatomic position.  Stable cardiomegaly.

## 2017-10-11 MED FILL — SPIRONOLACTONE 25 MG TABLET: 25 | 30 days supply | Qty: 30 | Fill #3

## 2017-10-11 MED FILL — PANTOPRAZOLE SOD DR 40 MG T: 40 | 30 days supply | Qty: 30 | Fill #6

## 2017-10-11 MED FILL — CARVEDILOL 6.25 MG TABLET: 6.25 | 30 days supply | Qty: 60 | Fill #2

## 2017-10-11 MED FILL — ENTRESTO 24 MG-26 MG TABLET: 24-26 | 30 days supply | Qty: 60 | Fill #2

## 2017-10-11 MED FILL — ATORVASTATIN 80 MG TABLET: 80 | 30 days supply | Qty: 30 | Fill #4

## 2017-10-22 ENCOUNTER — Encounter (HOSPITAL_COMMUNITY): Payer: Self-pay | Admitting: Emergency Medicine

## 2017-10-22 ENCOUNTER — Observation Stay (HOSPITAL_COMMUNITY)
Admission: EM | Admit: 2017-10-22 | Discharge: 2017-10-23 | Disposition: A | Discharge status: Home / Self Care | Payer: 59 | Attending: Oncology | Admitting: Oncology

## 2017-10-22 ENCOUNTER — Emergency Department (HOSPITAL_COMMUNITY): Payer: 59

## 2017-10-22 ENCOUNTER — Other Ambulatory Visit: Payer: Self-pay

## 2017-10-22 DIAGNOSIS — I509 Heart failure, unspecified: Secondary | ICD-10-CM | POA: Diagnosis not present

## 2017-10-22 DIAGNOSIS — F419 Anxiety disorder, unspecified: Secondary | ICD-10-CM

## 2017-10-22 DIAGNOSIS — Z881 Allergy status to other antibiotic agents status: Secondary | ICD-10-CM

## 2017-10-22 DIAGNOSIS — R42 Dizziness and giddiness: Secondary | ICD-10-CM | POA: Diagnosis not present

## 2017-10-22 DIAGNOSIS — R110 Nausea: Secondary | ICD-10-CM | POA: Diagnosis not present

## 2017-10-22 DIAGNOSIS — I420 Dilated cardiomyopathy: Secondary | ICD-10-CM | POA: Insufficient documentation

## 2017-10-22 DIAGNOSIS — Z9861 Coronary angioplasty status: Secondary | ICD-10-CM

## 2017-10-22 DIAGNOSIS — Z7982 Long term (current) use of aspirin: Secondary | ICD-10-CM | POA: Insufficient documentation

## 2017-10-22 DIAGNOSIS — Z87891 Personal history of nicotine dependence: Secondary | ICD-10-CM | POA: Diagnosis not present

## 2017-10-22 DIAGNOSIS — R0602 Shortness of breath: Secondary | ICD-10-CM | POA: Diagnosis not present

## 2017-10-22 DIAGNOSIS — I5022 Chronic systolic (congestive) heart failure: Secondary | ICD-10-CM

## 2017-10-22 DIAGNOSIS — Z79899 Other long term (current) drug therapy: Secondary | ICD-10-CM | POA: Insufficient documentation

## 2017-10-22 DIAGNOSIS — R079 Chest pain, unspecified: Secondary | ICD-10-CM

## 2017-10-22 DIAGNOSIS — R55 Syncope and collapse: Secondary | ICD-10-CM | POA: Diagnosis not present

## 2017-10-22 DIAGNOSIS — I5020 Unspecified systolic (congestive) heart failure: Secondary | ICD-10-CM | POA: Diagnosis present

## 2017-10-22 DIAGNOSIS — Z880 Allergy status to penicillin: Secondary | ICD-10-CM | POA: Diagnosis not present

## 2017-10-22 DIAGNOSIS — Z955 Presence of coronary angioplasty implant and graft: Secondary | ICD-10-CM | POA: Diagnosis not present

## 2017-10-22 DIAGNOSIS — Z888 Allergy status to other drugs, medicaments and biological substances status: Secondary | ICD-10-CM

## 2017-10-22 DIAGNOSIS — R002 Palpitations: Secondary | ICD-10-CM

## 2017-10-22 DIAGNOSIS — I080 Rheumatic disorders of both mitral and aortic valves: Secondary | ICD-10-CM | POA: Diagnosis not present

## 2017-10-22 DIAGNOSIS — Z8249 Family history of ischemic heart disease and other diseases of the circulatory system: Secondary | ICD-10-CM | POA: Insufficient documentation

## 2017-10-22 DIAGNOSIS — R0601 Orthopnea: Secondary | ICD-10-CM | POA: Diagnosis not present

## 2017-10-22 DIAGNOSIS — I428 Other cardiomyopathies: Secondary | ICD-10-CM

## 2017-10-22 LAB — I-STAT TROPONIN, ED
Troponin i, poc: 0 ng/mL (ref 0.00–0.08)
Troponin i, poc: 0 ng/mL (ref 0.00–0.08)

## 2017-10-22 LAB — CBC
HEMATOCRIT: 42.7 % (ref 39.0–52.0)
Hemoglobin: 14.2 g/dL (ref 13.0–17.0)
MCH: 30 pg (ref 26.0–34.0)
MCHC: 33.3 g/dL (ref 30.0–36.0)
MCV: 90.1 fL (ref 78.0–100.0)
PLATELETS: 224 10*3/uL (ref 150–400)
RBC: 4.74 MIL/uL (ref 4.22–5.81)
RDW: 12.4 % (ref 11.5–15.5)
WBC: 6.6 10*3/uL (ref 4.0–10.5)

## 2017-10-22 LAB — BASIC METABOLIC PANEL
Anion gap: 10 (ref 5–15)
BUN: 9 mg/dL (ref 6–20)
CO2: 23 mmol/L (ref 22–32)
CREATININE: 1.06 mg/dL (ref 0.61–1.24)
Calcium: 9.3 mg/dL (ref 8.9–10.3)
Chloride: 106 mmol/L (ref 98–111)
GFR calc Af Amer: >60 mL/min (ref >60)
GLUCOSE: 144 mg/dL — AB (ref 70–99)
POTASSIUM: 3.5 mmol/L (ref 3.5–5.1)
Sodium: 139 mmol/L (ref 135–145)

## 2017-10-22 LAB — TSH: TSH: 1.338 u[IU]/mL (ref 0.350–4.500)

## 2017-10-22 LAB — TROPONIN I: Troponin I: <0.03 ng/mL (ref <0.03)

## 2017-10-22 LAB — BRAIN NATRIURETIC PEPTIDE: B Natriuretic Peptide: 22.9 pg/mL (ref 0.0–100.0)

## 2017-10-22 MED ORDER — SACUBITRIL-VALSARTAN 24-26 MG PO TABS
1.0000 | ORAL_TABLET | Freq: Two times a day (BID) | ORAL | Status: DC
  Administered 2017-10-23: 1 via ORAL
  Filled 2017-10-22 (×2): qty 1

## 2017-10-22 MED ORDER — ENOXAPARIN SODIUM 40 MG/0.4ML ~~LOC~~ SOLN
40.0000 mg | SUBCUTANEOUS | Status: DC
  Administered 2017-10-22: 40 mg via SUBCUTANEOUS
  Filled 2017-10-22: qty 0.4

## 2017-10-22 MED ORDER — ATORVASTATIN CALCIUM 80 MG PO TABS
80.0000 mg | ORAL_TABLET | Freq: Every day | ORAL | Status: DC
  Administered 2017-10-22: 80 mg via ORAL
  Filled 2017-10-22: qty 1

## 2017-10-22 MED ORDER — SPIRONOLACTONE 25 MG PO TABS: 25.0000 mg | ORAL_TABLET | Freq: Every day | ORAL | Status: DC

## 2017-10-22 MED ORDER — PANTOPRAZOLE SODIUM 40 MG PO TBEC
40.0000 mg | DELAYED_RELEASE_TABLET | Freq: Every day | ORAL | Status: DC
  Administered 2017-10-22 – 2017-10-23 (×2): 40 mg via ORAL
  Filled 2017-10-22 (×2): qty 1

## 2017-10-22 MED ORDER — SACUBITRIL-VALSARTAN 24-26 MG PO TABS: 1.0000 | ORAL_TABLET | Freq: Two times a day (BID) | ORAL | Status: DC

## 2017-10-22 MED ORDER — SPIRONOLACTONE 25 MG PO TABS
25.0000 mg | ORAL_TABLET | Freq: Every day | ORAL | Status: DC
  Administered 2017-10-23: 25 mg via ORAL
  Filled 2017-10-22: qty 1

## 2017-10-22 MED ORDER — ASPIRIN 81 MG PO CHEW
81.0000 mg | CHEWABLE_TABLET | Freq: Every day | ORAL | Status: DC
  Administered 2017-10-23: 81 mg via ORAL
  Filled 2017-10-22: qty 1

## 2017-10-22 MED ORDER — CARVEDILOL 6.25 MG PO TABS
6.2500 mg | ORAL_TABLET | Freq: Two times a day (BID) | ORAL | Status: DC
  Administered 2017-10-22 – 2017-10-23 (×2): 6.25 mg via ORAL
  Filled 2017-10-22 (×2): qty 1

## 2017-10-22 MED ORDER — SODIUM CHLORIDE 0.9 % IV BOLUS
500.0000 mL | Freq: Once | INTRAVENOUS | Status: AC
  Administered 2017-10-22: 500 mL via INTRAVENOUS

## 2017-10-22 NOTE — H&P (Addendum)
Date: 10/22/2017               Patient Name:  Jerome Barnett MRN: [id number]  DOB: 08-19-1970 Age / Sex: 47 y.o., male   PCP: Patient, No Pcp Per         Medical Service: Internal Medicine Teaching Service         Attending Physician: Dr. Annia Belt, MD    First Contact: Dr. Donne Hazel Pager: [phone]  Second Contact: Dr. Hetty Ely Pager: [phone]       After Hours (After 5p/  First Contact Pager: [phone]  weekends / holidays): Second Contact Pager: [phone]   Chief Complaint: shortness of breath  History of Present Illness:  47 yo M with history of CHF (EF 35% per patient) who presents with tingling, light headedness, sob, rapid heart rate and nausea which occurred at 10:30pm yesterday and again this morning. Chest discomfort is non-radiating and has waxed and waned since last night. Worsening factors include walking. Alleviating factors include rest. He did not sleep well due to these symptoms. He states these symptoms are similar to when he first presented and was diagnosed with CHF in April 2017. During that admission, he presented in a similar fashion, received IVF and symptomatically improved. He also received an echo which showed 10-15% ejection fraction and had a heart cath. He was in the hospital for three weeks and still experienced some lingering symptoms after discharge that eventually resolved over the course of a few months. In December of 2018, he was seen as a follow up for his CHF. At that visit, he received an echo that was suggestive of an EF of 40%. He states that he has not experienced similar symptoms again until yesterday.  He reports missing a dose of Spironolactone, coreg, entresto, and aspirin Saturday morning. His weight increased 5lbs yesterday (170lbs to 175lbs). His dry weight is 170lbs.   At the time of my exam, all symptoms have resolved.    Does not have a PCP   Denies decreased appetite, fevers, travel history, dizziness upon standing   Meds:    Current Meds  Medication Sig  . aspirin 81 MG chewable tablet Chew 1 tablet (81 mg total) by mouth daily.  Marland Kitchen atorvastatin (LIPITOR) 80 MG tablet TAKE 1 TABLET (80 MG TOTAL) BY MOUTH DAILY AT 6 PM.  . carvedilol (COREG) 6.25 MG tablet Take 1 tablet (6.25 mg total) by mouth 2 (two) times daily with a meal.  . ENTRESTO 24-26 MG TAKE 1 TABLET BY MOUTH TWICE A DAY  . pantoprazole (PROTONIX) 40 MG tablet Take 1 tablet (40 mg total) by mouth daily.  Marland Kitchen spironolactone (ALDACTONE) 25 MG tablet TAKE 1 TABLET (25 MG TOTAL) BY MOUTH DAILY.     Allergies: Allergies as of 10/22/2017 - Review Complete 10/22/2017  Allergen Reaction Noted  . Amoxicillin Swelling 07/04/2015  . Lasix [furosemide] Swelling 07/04/2015   Past Medical History:  Diagnosis Date  . CHF (congestive heart failure) ([place])     Family History: none  Social History: Former smoker, 20 pack year history but quit 5 years ago. 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 Works out 3 times a week at [employer]  Review of Systems: A complete ROS was negative except as per HPI.   Physical Exam: Blood pressure (!) 145/88, pulse 67, temperature 98.3 F (36.8 C), temperature source Oral, resp. rate 15, height 5\' 8"  (1.727 m), weight 175 lb (79.4 kg), SpO2 98 %. Vitals:  10/22/17 1345 10/22/17 1400 10/22/17 1445 10/22/17 1815  BP: (!) 125/94  136/90 (!) 145/88  Pulse: 60  63 67  Resp: 15  15 15   Temp:      TempSrc:      SpO2: 94% 97% 99% 98%  Weight:      Height:       General: Vital signs reviewed.  Patient is well-developed and well-nourished, in no acute distress and cooperative with exam.  Head: Normocephalic and atraumatic. Eyes: EOMI, conjunctivae normal, no scleral icterus.  Neck: Supple, trachea midline, normal ROM, masses, thyromegaly, or carotid bruit present.  Cardiovascular: RRR, S1 normal, S2 pronounced, no murmurs, gallops, or rubs. +JVD Pulmonary/Chest: Faint bibasilar crackles on inspiration, no wheezing   Abdominal: Soft, non-tender, non-distended, BS +, no masses, organomegaly, or guarding present.  Musculoskeletal: No joint deformities, erythema, or stiffness, ROM full and nontender. Extremities: No lower extremity edema bilaterally,  pulses symmetric and intact bilaterally. No cyanosis or clubbing. Neurological: A&O x3, Strength is normal and symmetric bilaterally, cranial nerve II-XII are grossly intact, no focal motor deficit, sensory intact to light touch bilaterally.  Skin: Warm, dry and intact. No rashes or erythema. Psychiatric: Normal mood and affect. speech and behavior is normal. Cognition and memory are normal.    EKG: personally reviewed my interpretation is normal sinus rhythm, LVH  CXR: personally reviewed my interpretation is No edema or consolidation.  Stable cardiac silhouette.  Assessment & Plan by Problem: Active Problems:   Heart failure with reduced ejection fraction, NYHA class II (HCC)   Palpitations  Jerome Barnett is a 47 y/o man with a history of HFrEF(ranging from 10-40% over the last two years) who presents with episodes of tingling, sob, light headedness, palpitations and nausea concerning for angina vs CHF exacerbation  HFrEF, exacerbation: History of similar symptoms that improved with CHF exacerbation treatment. His EKG and troponin x2 have been unremarkable for ischemia. Heart cath in April 2018 with normal coronary arteries. Reports 5lb weight gain over one day in the setting of missing a dose of his medications.  - Hopeful that he can go home tomorrow after mild diuresis and cardiac monitoring - Home Spironolactone 25mg  qd - Continue home coreg, lipitor, aspirin, pantoprazole, and entresto - cardiac monitoring - orthostatic vitals - BNP. A1c, troponin, and TSH  Dispo: Admit patient to Observation with expected length of stay less than 2 midnights.  Signed: Isabelle Course, MD 10/22/2017, 6:37 PM  Pager: [phone]

## 2017-10-22 NOTE — ED Triage Notes (Signed)
Pt. Stated, I started having som lightheadedness and chest pain last night. Ive had it before .

## 2017-10-22 NOTE — H&P (Signed)
Date: 10/22/2017               Patient Name:  Jerome Barnett MRN: [id number]  DOB: 1970-10-02 Age / Sex: 47 y.o., male   PCP: Patient, No Pcp Per              Medical Service: Internal Medicine Teaching Service              Attending Physician: Dr. Annia Belt, MD                                   Chief Complaint: short of breath, "heart is fluttering"   History of Present Illness: 47 yo M with history of CHF who presents with tingling, light headedness, sob, rapid heart rate and nausea which occurred at 10:30pm yesterday and again this morning. Chest discomfort is non-radiating and has waxed and waned since last night. Worsening factors include walking. Alleviating factors include rest. He did not sleep well due to these symptoms. He states these symptoms are similar to when he first presented and was diagnosed with CHF in April 2017. During that admission, he presented in a similar fashion, received IVF and symptomatically improved. He also received an echo which showed 10-15% ejection fraction and had a heart cath. He was in the hospital for three weeks and still experienced some lingering symptoms after discharge that eventually resolved over the course of a few months. In December of 2018, he was seen as a follow up for his CHF. At that visit, he received an echo that was suggestive of an EF of 40%. He states that he has not experienced similar symptoms again until yesterday.   He reports missing a dose of Spironolactone, coreg, entresto, and aspirin Saturday morning. His weight increased 5lbs yesterday (8/4) from 170lbs to 175lbs. His normal dry weight is 170lbs, which he checks daily. He reports spending lots of time outside for work as a [employer]. He denies any recent travel or supplement use. Denies decreased appetite, fevers, travel history, dizziness upon standing. He drinks infrequently, and does not currently smoke although says he has a 20 pack year history. He  denies recreational drug use. At the time of my exam, all symptoms have resolved.    Meds: No current facility-administered medications on file prior to encounter.    Current Outpatient Medications on File Prior to Encounter  Medication Sig Dispense Refill  . aspirin 81 MG chewable tablet Chew 1 tablet (81 mg total) by mouth daily. 30 tablet 3  . atorvastatin (LIPITOR) 80 MG tablet TAKE 1 TABLET (80 MG TOTAL) BY MOUTH DAILY AT 6 PM. 90 tablet 3  . carvedilol (COREG) 6.25 MG tablet Take 1 tablet (6.25 mg total) by mouth 2 (two) times daily with a meal. 60 tablet 3  . ENTRESTO 24-26 MG TAKE 1 TABLET BY MOUTH TWICE A DAY 60 tablet 3  . pantoprazole (PROTONIX) 40 MG tablet Take 1 tablet (40 mg total) by mouth daily. 90 tablet 3  . spironolactone (ALDACTONE) 25 MG tablet TAKE 1 TABLET (25 MG TOTAL) BY MOUTH DAILY. 30 tablet 6    Allergies: Allergies as of 10/22/2017 - Review Complete 10/22/2017  Allergen Reaction Noted  . Amoxicillin Swelling 07/04/2015  . Lasix [furosemide] Swelling 07/04/2015   Past Medical History:  Diagnosis Date  . CHF (congestive heart failure) ([place])    Past Surgical History:  Procedure Laterality Date  . CARDIAC  CATHETERIZATION N/A 07/05/2015   Procedure: Right/Left Heart Cath and Coronary Angiography;  Surgeon: Jolaine Artist, MD;  Location: [facility];  Service: Cardiovascular;  Laterality: N/A;   Family History  Problem Relation Age of Onset  . Hypertension Father   . Hyperlipidemia Father    Social History   Socioeconomic History  . Marital status: Married    Spouse name: Not on file  . Number of children: Not on file  . Years of education: Not on file  . Highest education level: Not on file  Occupational History  . Not on file  Social Needs  . Financial resource strain: Not on file  . Food insecurity:    Worry: Not on file    Inability: Not on file  . Transportation needs:    Medical: Not on file    Non-medical: Not on file    Tobacco Use  . Smoking status: Former Smoker    Packs/day: 2.00    Years: 20.00    Pack years: 40.00    Last attempt to quit: 03/20/2012    Years since quitting: 5.5  . Smokeless tobacco: Never Used  Substance and Sexual Activity  . Alcohol use: No    Alcohol/week: 0.0 oz  . Drug use: No  . Sexual activity: Not on file  Lifestyle  . Physical activity:    Days per week: Not on file    Minutes per session: Not on file  . Stress: Not on file  Relationships  . Social connections:    Talks on phone: Not on file    Gets together: Not on file    Attends religious service: Not on file    Active member of club or organization: Not on file    Attends meetings of clubs or organizations: Not on file    Relationship status: Not on file  . Intimate partner violence:    Fear of current or ex partner: Not on file    Emotionally abused: Not on file    Physically abused: Not on file    Forced sexual activity: Not on file  Other Topics Concern  . Not on file  Social History Narrative   Patient is married, no children.   Has pets   Works full time at [employer]     Review of Systems: Review of Systems  Constitutional: Negative for chills, fever and weight loss.  HENT: Negative for ear pain, hearing loss and tinnitus.   Eyes: Negative for blurred vision and double vision.  Respiratory: Positive for shortness of breath.   Cardiovascular: Positive for palpitations, orthopnea and PND. Negative for leg swelling.  Gastrointestinal: Positive for nausea. Negative for constipation, diarrhea, heartburn and vomiting.  Neurological: Positive for dizziness and tingling. Negative for loss of consciousness.  Psychiatric/Behavioral: The patient is nervous/anxious.     Physical Exam: Blood pressure (!) 138/94, pulse 65, temperature 98.3 F (36.8 C), temperature source Oral, resp. rate 19, height 5\' 8"  (1.727 m), weight 175 lb (79.4 kg), SpO2 95 %.  Physical Exam  Constitutional: He is oriented  to person, place, and time and well-developed, well-nourished, and in no distress. No distress.  HENT:  Head: Normocephalic and atraumatic.  Mouth/Throat: Oropharynx is clear and moist. No oropharyngeal exudate.  Eyes: Pupils are equal, round, and reactive to light. Conjunctivae and EOM are normal. Right eye exhibits no discharge. Left eye exhibits no discharge. No scleral icterus.  Neck: Neck supple. No tracheal deviation present.  Cardiovascular: Normal rate  and regular rhythm.  S2 pronounced  Pulmonary/Chest: Effort normal and breath sounds normal. No accessory muscle usage or stridor.  Neurological: He is alert and oriented to person, place, and time. He has intact cranial nerves. He displays no weakness and facial symmetry. He has a normal Cerebellar Exam and a normal Heel to [facility].  Skin: Skin is warm, dry and intact. He is not diaphoretic.  Psychiatric: Affect normal. His mood appears anxious.    Lab results: Results for orders placed or performed during the hospital encounter of 10/22/17 (from the past 24 hour(s))  I-stat troponin, ED     Status: None   Collection Time: 10/22/17  9:59 AM  Result Value Ref Range   Troponin i, poc 0.00 0.00 - 0.08 ng/mL   Comment 3          Basic metabolic panel     Status: Abnormal   Collection Time: 10/22/17 10:00 AM  Result Value Ref Range   Sodium 139 135 - 145 mmol/L   Potassium 3.5 3.5 - 5.1 mmol/L   Chloride 106 98 - 111 mmol/L   CO2 23 22 - 32 mmol/L   Glucose, Bld 144 (H) 70 - 99 mg/dL   BUN 9 6 - 20 mg/dL   Creatinine, Ser 1.06 0.61 - 1.24 mg/dL   Calcium 9.3 8.9 - 10.3 mg/dL   GFR calc non Af Amer >60 >60 mL/min   GFR calc Af Amer >60 >60 mL/min   Anion gap 10 5 - 15  CBC     Status: None   Collection Time: 10/22/17 10:00 AM  Result Value Ref Range   WBC 6.6 4.0 - 10.5 K/uL   RBC 4.74 4.22 - 5.81 MIL/uL   Hemoglobin 14.2 13.0 - 17.0 g/dL   HCT 42.7 39.0 - 52.0 %   MCV 90.1 78.0 - 100.0 fL   MCH 30.0 26.0 - 34.0 pg    MCHC 33.3 30.0 - 36.0 g/dL   RDW 12.4 11.5 - 15.5 %   Platelets 224 150 - 400 K/uL  I-stat troponin, ED     Status: None   Collection Time: 10/22/17  2:48 PM  Result Value Ref Range   Troponin i, poc 0.00 0.00 - 0.08 ng/mL   Comment 3             Imaging results:  Dg Chest 2 View  Result Date: 10/22/2017 CLINICAL DATA:  Chest pain and shortness of breath EXAM: CHEST - 2 VIEW COMPARISON:  June 09, 2016 FINDINGS: No edema or consolidation. The heart size and pulmonary vascularity are normal. No adenopathy. No bone lesions. No pneumothorax. IMPRESSION: No edema or consolidation.  Stable cardiac silhouette. Electronically Signed   By: Lowella Grip III M.D.   On: 10/22/2017 10:34    Assessment & Plan by Problem: Mr. Dax is a 47 y/o man with a history of CHF with varying EF ranging from 10-40% over the last two years. His presenting symptoms are congruent with a previous exacerbation of CHF in April 2017.   # SOB  Palpitations: Given his past history of a CHF exacerbations with similar presenting symptoms, and his otherwise unremarkable exam, my leading hypothesis is his SOB and palpitations are likely 2/2 his CHF.  He has received an EKG, which demonstrated LVH but was not suggestive of an acute infarct. He had two negative troponins today, which makes MI less likely. A CXR, CBC, and BMP were unremarkable save mild hyperglycemia. Given his history of forgetting  medications over the weekend, overlayed with this mornings weight gain, are suggestive of a CHF exacerbation. Differential diagnosis includes PE, which is made less likely by EKG findings, symptomatic transience. Also consider asthma which is made less likely by a normal exam and CXR.  - Spironolactone 25mg  qd - continue home coreg, lipitor, asprin, pantoprozole and entresto - Cardiac monitoring  - Orthostatic vitals - BNP, A1c, troponin, and TSH    This is a Careers information officer Note.  The care of the patient was discussed with  Dr. Donne Hazel and the assessment and plan was formulated with their assistance.  Please see their note for official documentation of the patient encounter.   Signed: Clearnce Hasten, Medical Student 10/22/2017, 7:26 PM

## 2017-10-22 NOTE — ED Notes (Signed)
Patient ambulatory to bathroom with steady gait at this time 

## 2017-10-22 NOTE — ED Provider Notes (Signed)
[facility] Emergency Department Provider Note MRN:  [id number]  Arrival date & time: 10/22/17     Chief Complaint   Dizziness and Chest Pain   History of Present Illness   Jerome Barnett is a 47 y.o. year-old male with a history of congestive heart failure presenting to the ED with chief complaint of dizziness and chest pain.  Patient explains that last night at about 10:30 PM he was going to bed when he experienced a sudden onset of a fluttering sensation in the left side of his chest, associated with lightheadedness, nausea.  The symptoms improved and he was able to go to sleep.  He woke up feeling well but then his symptoms returned, also with dry heaves this morning.  Patient denies fevers or cough, no shortness of breath, some continued pain of his left flank where he recently has had shingles.  Review of Systems  A complete 10 system review of systems was obtained and all systems are negative except as noted in the HPI and PMH.   Patient's Health History    Past Medical History:  Diagnosis Date  . CHF (congestive heart failure) ([place])     Past Surgical History:  Procedure Laterality Date  . CARDIAC CATHETERIZATION N/A 07/05/2015   Procedure: Right/Left Heart Cath and Coronary Angiography;  Surgeon: Jolaine Artist, MD;  Location: [place] [facility];  Service: Cardiovascular;  Laterality: N/A;    Family History  Problem Relation Age of Onset  . Hypertension Father   . Hyperlipidemia Father     Social History   Socioeconomic History  . Marital status: Married    Spouse name: Not on file  . Number of children: Not on file  . Years of education: Not on file  . Highest education level: Not on file  Occupational History  . Not on file  Social Needs  . Financial resource strain: Not on file  . Food insecurity:    Worry: Not on file    Inability: Not on file  . Transportation needs:    Medical: Not on file    Non-medical: Not on file  Tobacco  Use  . Smoking status: Former Smoker    Packs/day: 2.00    Years: 20.00    Pack years: 40.00    Last attempt to quit: 03/20/2012    Years since quitting: 5.5  . Smokeless tobacco: Never Used  Substance and Sexual Activity  . Alcohol use: No    Alcohol/week: 0.0 oz  . Drug use: No  . Sexual activity: Not on file  Lifestyle  . Physical activity:    Days per week: Not on file    Minutes per session: Not on file  . Stress: Not on file  Relationships  . Social connections:    Talks on phone: Not on file    Gets together: Not on file    Attends religious service: Not on file    Active member of club or organization: Not on file    Attends meetings of clubs or organizations: Not on file    Relationship status: Not on file  . Intimate partner violence:    Fear of current or ex partner: Not on file    Emotionally abused: Not on file    Physically abused: Not on file    Forced sexual activity: Not on file  Other Topics Concern  . Not on file  Social History Narrative   Patient is married, no children.   Has  pets   Works full time at [employer] Signs and Nursing Notes reviewed Vitals:   10/22/17 1845 10/22/17 2011  BP: (!) 138/94 (!) 151/92  Pulse: 65 65  Resp: 19 18  Temp:  98 F (36.7 C)  SpO2: 95% 98%    CONSTITUTIONAL: Well-appearing, NAD NEURO:  Alert and oriented x 3, no focal deficits EYES:  eyes equal and reactive ENT/NECK:  no LAD, no JVD CARDIO: Regular rate, well-perfused, normal S1 and S2 PULM:  CTAB no wheezing or rhonchi GI/GU:  normal bowel sounds, non-distended, non-tender MSK/SPINE:  No gross deformities, no edema SKIN: Dermatomal scarring of the left flank PSYCH:  Appropriate speech and behavior  Diagnostic and Interventional Summary    EKG Interpretation  Date/Time:  Monday October 22 2017 09:28:35 EDT Ventricular Rate:  75 PR Interval:  132 QRS Duration: 92 QT Interval:  380 QTC Calculation: 424 R  Axis:   33 Text Interpretation:  Normal sinus rhythm Left ventricular hypertrophy Cannot rule out Inferior infarct , age undetermined Abnormal ECG Confirmed by Gerlene Fee [phone]) on 10/22/2017 2:11:07 PM      Labs Reviewed  BASIC METABOLIC PANEL - Abnormal; Notable for the following components:      Result Value   Glucose, Bld 144 (*)    All other components within normal limits  CBC  TROPONIN I  TSH  BRAIN NATRIURETIC PEPTIDE  HIV ANTIBODY (ROUTINE TESTING)  HEMOGLOBIN S8N  BASIC METABOLIC PANEL  I-STAT TROPONIN, ED  I-STAT TROPONIN, ED    DG Chest 2 View  Final Result      Medications  pantoprazole (PROTONIX) EC tablet 40 mg (40 mg Oral Given 10/22/17 2233)  carvedilol (COREG) tablet 6.25 mg (6.25 mg Oral Given 10/22/17 2232)  atorvastatin (LIPITOR) tablet 80 mg (80 mg Oral Given 10/22/17 2232)  aspirin chewable tablet 81 mg (has no administration in time range)  enoxaparin (LOVENOX) injection 40 mg (40 mg Subcutaneous Given 10/22/17 2232)  sacubitril-valsartan (ENTRESTO) 24-26 mg per tablet (has no administration in time range)  spironolactone (ALDACTONE) tablet 25 mg (has no administration in time range)  sodium chloride 0.9 % bolus 500 mL (0 mLs Intravenous Stopped 10/22/17 1546)     Procedures Critical Care  ED Course and Medical Decision Making  I have reviewed the triage vital signs and the nursing notes.  Pertinent labs & imaging results that were available during my care of the patient were reviewed by me and considered in my medical decision making (see below for details). Clinical Course as of Oct 22 2237  Mon Oct 23, 7943  1159 47 year old male history of congestive heart failure presenting with unprovoked chest palpitations, dizziness.  Vital signs stable, well-appearing, no signs of fluid overload today.  Patient thinks he may be dehydrated, as the symptoms occurred previously during an episode of dehydration.  Will observe with cardiac monitoring for a few hours in  the ED, to tropes, 500 cc IV fluids and reassess.   [MB]  1550 Troponin negative x2.  However given patient's history of heart failure, the nature of his symptoms, and the new EKG changes today (new T wave inversion), will consult internal medicine for admission.   [MB]    Clinical Course User Index [MB] Maudie Flakes, MD    Admitted to medicine for further care and evaluation.  Barth Kirks. Sedonia Small, Scott [email or web address]  Final Clinical Impressions(s) / ED  Diagnoses     ICD-10-CM   1. Near syncope R55   2. Chest pain, unspecified type R07.9   3. Dizziness R42   4. Palpitations R00.2     ED Discharge Orders    None         Maudie Flakes, MD 10/22/17 2239

## 2017-10-23 ENCOUNTER — Observation Stay (HOSPITAL_BASED_OUTPATIENT_CLINIC_OR_DEPARTMENT_OTHER): Payer: 59

## 2017-10-23 DIAGNOSIS — I351 Nonrheumatic aortic (valve) insufficiency: Secondary | ICD-10-CM

## 2017-10-23 DIAGNOSIS — I340 Nonrheumatic mitral (valve) insufficiency: Secondary | ICD-10-CM

## 2017-10-23 DIAGNOSIS — R0602 Shortness of breath: Secondary | ICD-10-CM | POA: Diagnosis not present

## 2017-10-23 DIAGNOSIS — R0601 Orthopnea: Secondary | ICD-10-CM | POA: Diagnosis not present

## 2017-10-23 DIAGNOSIS — I509 Heart failure, unspecified: Secondary | ICD-10-CM | POA: Diagnosis not present

## 2017-10-23 DIAGNOSIS — R110 Nausea: Secondary | ICD-10-CM | POA: Diagnosis not present

## 2017-10-23 DIAGNOSIS — R002 Palpitations: Secondary | ICD-10-CM | POA: Diagnosis not present

## 2017-10-23 DIAGNOSIS — I080 Rheumatic disorders of both mitral and aortic valves: Secondary | ICD-10-CM | POA: Diagnosis not present

## 2017-10-23 LAB — BASIC METABOLIC PANEL
Anion gap: 9 (ref 5–15)
BUN: 10 mg/dL (ref 6–20)
CO2: 27 mmol/L (ref 22–32)
Calcium: 9 mg/dL (ref 8.9–10.3)
Chloride: 104 mmol/L (ref 98–111)
Creatinine, Ser: 0.99 mg/dL (ref 0.61–1.24)
GFR calc Af Amer: >60 mL/min (ref >60)
Glucose, Bld: 114 mg/dL — ABNORMAL HIGH (ref 70–99)
POTASSIUM: 3.8 mmol/L (ref 3.5–5.1)
Sodium: 140 mmol/L (ref 135–145)

## 2017-10-23 LAB — HEMOGLOBIN A1C
HEMOGLOBIN A1C: 5.6 % (ref 4.8–5.6)
Mean Plasma Glucose: 114.02 mg/dL

## 2017-10-23 LAB — ECHOCARDIOGRAM LIMITED
HEIGHTINCHES: 68 in
Weight: 2718.4 oz

## 2017-10-23 LAB — HIV ANTIBODY (ROUTINE TESTING W REFLEX): HIV Screen 4th Generation wRfx: NONREACTIVE

## 2017-10-23 NOTE — Discharge Summary (Signed)
Name: Indio Santilli MRN: [id number] DOB: 1970-12-29 47 y.o. PCP: Patient, No Pcp Per  Date of Admission: 10/22/2017  9:26 AM Date of Discharge:  Attending Physician: Annia Belt, MD  Discharge Diagnosis: 1. Mild CHF Exacerbation vs. Transient Arrhythmia    Discharge Medications: Allergies as of 10/23/2017      Reactions   Amoxicillin Swelling   Eye swelling   Lasix [furosemide] Swelling   Eye swelling      Medication List    TAKE these medications   aspirin 81 MG chewable tablet Chew 1 tablet (81 mg total) by mouth daily.   atorvastatin 80 MG tablet Commonly known as:  LIPITOR TAKE 1 TABLET (80 MG TOTAL) BY MOUTH DAILY AT 6 PM.   carvedilol 6.25 MG tablet Commonly known as:  COREG Take 1 tablet (6.25 mg total) by mouth 2 (two) times daily with a meal.   ENTRESTO 24-26 MG Generic drug:  sacubitril-valsartan TAKE 1 TABLET BY MOUTH TWICE A DAY   pantoprazole 40 MG tablet Commonly known as:  PROTONIX Take 1 tablet (40 mg total) by mouth daily.   spironolactone 25 MG tablet Commonly known as:  ALDACTONE TAKE 1 TABLET (25 MG TOTAL) BY MOUTH DAILY.       Disposition and follow-up:   Mr.Dasean Hauth was discharged from [facility] in Good condition.  At the hospital follow up visit please address:  1.  Please inquire about any continued symptoms and consider outpatient cardiac monitor if concern for arrhythmia   2.  Labs / imaging needed at time of follow-up: none  3.  Pending labs/ test needing follow-up: none   Follow-up Appointments: Follow-up Information    Bensimhon, Shaune Pascal, MD Follow up.   Specialty:  Cardiology Why:  Please go to your already scheduled appointment on 8/19 at [facility] information: [address] [place] [address] [phone]           Hospital Course by problem list:     Jerome Barnett is a 47 y.o. year-old male with a history of CHF who presented with  tingling, light headedness, SOB, rapid heart rate and nausea which occurred at 10:30pm on 8/4 and again on the morning of 8/5. He described the symptoms as similar to when he was diagnosed with CHF in April 2017. He has not had similar symptoms since then. He has a 40-pack year history but quit in 2014. He does not drink EtOH, or use recreational drugs. He had no recent travel and had not been sick.   As an initial work-up, he received trop(x3, neg), BNP(<0.03), CBC (WNL), A1c (5.6), TSH (1.338), and HIV screen (neg). All were unrevealing. ECG showed no gross acute ischemic changes although high J-point elevation versus minimal ST elevation in leads I and aVL and deep inverted T wave in lead III. T wave inverted in lead V1. T waves were biphasic in lead III on a March 2018 tracing. Q wave in lead III on current tracing but not on the March 2018 tracing. Echo showed EF of 45-50% with hypokinesis of the left ventricle, mild aortic and mitral regurgitation and mild left atrial dilation. CXR showed no edema or consolidation. Stable cardiac silhouette.    His symptoms had completely resolved at the time of his evaluation by the internal medicine team. He was admitted to the floor and restarted on home medications, and placed on a cardiac monitor overnight. He was still free of symptoms in the morning and  repeat ECG and trop were unremarkable. Cardiology was consulted and evaluated the ECG tracings, concluding that they were likely reflective of left ventricular hypertrophy and did not represent an acute condition. A follow up appointment was made with cardiologist on 19 AUG.   # SOB and palpitations: Etiology unclear. History of a CHF exacerbations with SOB and palpitations that felt similar, although the physical exam does not reveal volume overload and the BNP is normal. Other differential includes transient arrhythmia vs panic attack. He is currently asymptomatic. BNP, A1c, troponin, CXR and TSH were all  unremarkable.  - Continue home Spironolactone 25mg  qd  - Continue home coreg, lipitor, asprin, pantoprozole and entresto  - Cardiology consulted, will f/u at outpatient visit on 8/19      Discharge Vitals:   BP 125/89 (BP Location: Left Arm)   Pulse 70   Temp 98.1 F (36.7 C) (Oral)   Resp 18   Ht 5\' 8"  (1.727 m)   Wt 169 lb 14.4 oz (77.1 kg)   SpO2 95%   BMI 25.83 kg/m   Pertinent Labs, Studies, and Procedures:  Echo 10/23/17: - Left ventricle: The cavity size was normal. Wall thickness was   normal. Systolic function was mildly reduced. The estimated   ejection fraction was in the range of 45% to 50%. Diffuse   hypokinesis. The study is not technically sufficient to allow   evaluation of LV diastolic function. GLS: -15% - Aortic valve: There was mild regurgitation. - Mitral valve: There was mild regurgitation. - Left atrium: The atrium was mildly dilated.  Discharge Instructions: Discharge Instructions    (HEART FAILURE PATIENTS) Call MD:  Anytime you have any of the following symptoms: 1) 3 pound weight gain in 24 hours or 5 pounds in 1 week 2) shortness of breath, with or without a dry hacking cough 3) swelling in the hands, feet or stomach 4) if you have to sleep on extra pillows at night in order to breathe.   Complete by:  As directed    Call MD for:  persistant dizziness or light-headedness   Complete by:  As directed    Call MD for:  persistant nausea and vomiting   Complete by:  As directed    Diet - low sodium heart healthy   Complete by:  As directed    Discharge instructions   Complete by:  As directed    Mr. Tolen,   You were seen in the hospital for shortness of breath, rapid heart rate, and nausea. During your admission, you received blood tests, x-rays, an echocardiogram and ECGs. The testing did not pinpoint the cause of your symptoms but did allow your doctors to be sure that you were not having a heart attack or another serious condition which would  require you to be hospitalized. You could have experienced a transient arrhythmia or you could have had the beginnings of a heart failure exacerbation which resolved after taking your Spironolactone. We talked to your cardiologist about your case and he agreed that you were able to go home and follow up with him later in the month. The team contacted his office and set up an appointment for you on August 19th at 11:00am for follow up.   Increase activity slowly   Complete by:  As directed       Signed: Isabelle Course, MD 10/23/2017, 4:32 PM   Pager: [phone]

## 2017-10-23 NOTE — Progress Notes (Addendum)
Subjective: [name] was alert and sitting upright in bed when the team came to see him today. He was talkative and shared that he has not had and chest pain, shortness of breath, or heart fluttering sensation since admission. We talked about his care plan and course of action. He didn't have any questions but seemed happy about the prospect of going home today.    Objective: Vital signs in last 24 hours: Vitals:   10/22/17 2011 10/22/17 2328 10/23/17 0529 10/23/17 1003  BP: (!) 151/92 140/87 132/90 127/87  Pulse: 65 72 68 71  Resp: 18 18 18    Temp: 98 F (36.7 C) 97.9 F (36.6 C) 97.8 F (36.6 C)   TempSrc: Oral Oral Oral   SpO2: 98% 96% 96% 96%  Weight: 168 lb 11.2 oz (76.5 kg)  169 lb 14.4 oz (77.1 kg)   Height: 5\' 8"  (1.727 m)       Intake/Output Summary (Last 24 hours) at 10/23/2017 1152 Last data filed at 10/23/2017 1009 Gross per 24 hour  Intake 740.2 ml  Output 450 ml  Net 290.2 ml   Physical Exam  Constitutional: He is oriented to person, place, and time and well-developed, well-nourished, and in no distress. No distress.  HENT:  Head: Normocephalic and atraumatic.  Neck: Neck supple.  Cardiovascular: Intact distal pulses.  Pulmonary/Chest: Effort normal.  Abdominal: He exhibits no distension.  Musculoskeletal: He exhibits no edema, tenderness or deformity.  Neurological: He is alert and oriented to person, place, and time. He displays normal reflexes.  Skin: Skin is warm and dry. He is not diaphoretic.  Psychiatric: Mood and affect normal.    Lab Results: Results for orders placed or performed during the hospital encounter of 10/22/17 (from the past 24 hour(s))  I-stat troponin, ED     Status: None   Collection Time: 10/22/17  2:48 PM  Result Value Ref Range   Troponin i, poc 0.00 0.00 - 0.08 ng/mL   Comment 3          Troponin I     Status: None   Collection Time: 10/22/17  8:05 PM  Result Value Ref Range   Troponin I <0.03 <0.03 ng/mL  TSH      Status: None   Collection Time: 10/22/17  8:05 PM  Result Value Ref Range   TSH 1.338 0.350 - 4.500 uIU/mL  Brain natriuretic peptide     Status: None   Collection Time: 10/22/17  8:05 PM  Result Value Ref Range   B Natriuretic Peptide 22.9 0.0 - 100.0 pg/mL  Hemoglobin A1c     Status: None   Collection Time: 10/22/17  8:05 PM  Result Value Ref Range   Hgb A1c MFr Bld 5.6 4.8 - 5.6 %   Mean Plasma Glucose 114.02 mg/dL  Basic metabolic panel     Status: Abnormal   Collection Time: 10/23/17  6:15 AM  Result Value Ref Range   Sodium 140 135 - 145 mmol/L   Potassium 3.8 3.5 - 5.1 mmol/L   Chloride 104 98 - 111 mmol/L   CO2 27 22 - 32 mmol/L   Glucose, Bld 114 (H) 70 - 99 mg/dL   BUN 10 6 - 20 mg/dL   Creatinine, Ser 0.99 0.61 - 1.24 mg/dL   Calcium 9.0 8.9 - 10.3 mg/dL   GFR calc non Af Amer >60 >60 mL/min   GFR calc Af Amer >60 >60 mL/min   Anion gap 9 5 - 15   Micro  Results: No results found for this or any previous visit (from the past 240 hour(s)). Studies/Results: Dg Chest 2 View  Result Date: 10/22/2017 CLINICAL DATA:  Chest pain and shortness of breath EXAM: CHEST - 2 VIEW COMPARISON:  June 09, 2016 FINDINGS: No edema or consolidation. The heart size and pulmonary vascularity are normal. No adenopathy. No bone lesions. No pneumothorax. IMPRESSION: No edema or consolidation.  Stable cardiac silhouette. Electronically Signed   By: Lowella Grip III M.D.   On: 10/22/2017 10:34   Medications: I have reviewed the patient's current medications. Scheduled Meds: . aspirin  81 mg Oral Daily  . atorvastatin  80 mg Oral q1800  . carvedilol  6.25 mg Oral BID WC  . enoxaparin (LOVENOX) injection  40 mg Subcutaneous Q24H  . pantoprazole  40 mg Oral Daily  . sacubitril-valsartan  1 tablet Oral BID  . spironolactone  25 mg Oral Daily   Assessment/Plan: [name] is a 47 y/o man with a history of CHF with varying EF ranging from 10-40% over the last two years. His presenting  symptoms are congruent with a previous exacerbation of CHF in April 2017.   # SOB and palpitations: Etiology unclear. History of a CHF exacerbations with SOB and palpitations that felt similar, although the physical exam does not reveal volume overload and the BNP is normal. Cannot rule out a panic attack. Non-revealing EKG abnormalities spurred cardiology consultation. He is currently asymptomatic. BNP, A1c, troponin, CXR and TSH were all unremarkable.  - Spironolactone 25mg  qd - continue home coreg, lipitor, asprin, pantoprozole and entresto - Cardiac monitoring  - Consult cardiology (8/6)    This is a Careers information officer Note.  The care of the patient was discussed with Dr. Donne Hazel and the assessment and plan formulated with her assistance.  Please see her attached note for official documentation of the daily encounter.   LOS: 0 days   Clearnce Hasten, Medical Student 10/23/2017, 11:52 AM

## 2017-10-23 NOTE — Progress Notes (Signed)
   Subjective: Jerome Barnett is feeling well this morning. Has no complaints. He is sitting up in bed and denies any more chest pain, palipations, sob. His weight is down 6lbs from admission. We discussed the possibility of going home today which he was in agreement with   Objective:  Vital signs in last 24 hours: Vitals:   10/22/17 2328 10/23/17 0529 10/23/17 1003 10/23/17 1153  BP: 140/87 132/90 127/87 125/89  Pulse: 72 68 71 70  Resp: 18 18    Temp: 97.9 F (36.6 C) 97.8 F (36.6 C)  98.1 F (36.7 C)  TempSrc: Oral Oral  Oral  SpO2: 96% 96% 96% 95%  Weight:  169 lb 14.4 oz (77.1 kg)    Height:       Gen: Well appearing, NAD ENT: OP clear without erythema or exudate.  CV: RRR, no murmurs, no JVD Pulm: Normal effort, CTA throughout, no wheezing Abd: Soft, NT, ND, normal BS.  Ext: Warm, no edema, normal joints Skin:  No rashes  Assessment/Plan:  Active Problems:   Heart failure with reduced ejection fraction, NYHA class II (HCC)   Palpitations  Jerome Barnett is a 47 y/o man with a history of non ischemic cardiomyopathy and HFrEF(ranging from 10-40% over the last two years) who presents with episodes of orthopnea, tingling, sob, light headedness, palpitations and nausea concerning for angina vs CHF exacerbation  SOB with palpitations: Improving. History of similar symptoms that improved with CHF exacerbation treatment. However, no JVD, crackles, or lower extremity edema on exam. O2 sats normal on RA. BUN normal.  Troponin x2 have been negative. Heart cath in April 2018 with normal coronary arteries. Reports 5lb weight gain over one day in the setting of missing a dose of his medications. Concerns for arrhythmia vs CHF exacerbation - Initial EKG: with normal sinus rhythm, no arrythmia, no gross acute ischemic changes. Although high J-point elevation versus minimal ST elevation in leads I and aVL and deep inverted T wave in lead III.  T wave inverted in lead V1.   - repeat EKG:  sinus rhythm, LVH. Question 1 mm ST elevation in leads I and aVL.  T waves inverted in lead III.  Q wave no longer present.  T waves now upright in lead V1. - Curbside consult with cardiology who felt that EKG changes could be explained by LVH - Weight down 6lbs since admission, symptoms have resolved - Home Spironolactone 25mg  qd - Continue home coreg, lipitor, aspirin, pantoprazole, and entresto - cardiac monitoring - BNP, troponin, and TSH all WNLs   Dispo: Anticipated discharge in approximately 1 day(s).   Isabelle Course, MD 10/23/2017, 4:13 PM Pager: [phone]

## 2017-11-02 NOTE — Progress Notes (Signed)
Patient ID: Jerome Barnett, male   DOB: 1970/11/03, 47 y.o.   MRN: [id number]    [facility]  NOTE  Primary HF: Dr. Haroldine Laws  PCP: none   HPI: Jerome Barnett is a 47 year old male with h/o HL, HTN and GERD who was admitted in 4/17 with acute HF. EF 15%.  Underwent cath in 4/17 normal cors with low filling pressures and normal cardiac output. Procedure complicated by radial artery spasm requiring multiple rounds of verapamil and NTG. Patient then developed shock and was supported with norepi and milrinone transiently. Was discharged home and then readmitted several days later with hypotension in setting of volume depletion due to severe restriction of po intake.    Seen in ED 08/03/15 and found to be orthostatic. Symptoms improved with 500 cc of IVF.   Admitted earlier this month with increased dyspnea. ECHO performed and showed EF 45-50% (improved) . Troponin was negative and BNP was not elevated so he was discharged   Today he returns for post hospital follow up. Overall feeling ok. Every now and then he is short of breath. Denies PND/Orthopnea. Every now then has some dizziness. Appetite ok. No fever or chills. Weight at home 175--->170 pounds. Working out 3-4 days a week. Having palpitations every night. Says he is under a lot of stress at work. Taking all medications  Cardiac Studies   ECHO 10/2017  Left ventricle: The cavity size was normal. Wall thickness was   normal. Systolic function was mildly reduced. The estimated   ejection fraction was in the range of 45% to 50%. Diffuse   hypokinesis. The study is not technically sufficient to allow   evaluation of LV diastolic function. GLS: -15% - Aortic valve: There was mild regurgitation. - Mitral valve: There was mild regurgitation. - Left atrium: The atrium was mildly dilated.  Echo 07/04/15 LVEF 10-15%, normal RV ECHO 01/08/16 EF ~40% normal RV  cMRI  06/16/16  LVEF 43% RV 41% NICM  RHC/LHC 06/2015  RA = 2 RV =  28/0/4 PA = 28/11 (18) PCW = 5 Fick cardiac output/index = 4.3/2.3 PVR = 3.0 WU FA sat = 94% PA sat = 60%, 63% Assessment: 1. Normal coronary arteries 2. Low filling pressures with normal cardiac output 3. Severe NICM with EF 10% by echo 3. Development of severe hypotension and shock in cath lab due to vasodilators to treat radial artery spasm    Current Outpatient Medications  Medication Sig Dispense Refill  . aspirin 81 MG chewable tablet Chew 1 tablet (81 mg total) by mouth daily. 30 tablet 3  . atorvastatin (LIPITOR) 80 MG tablet TAKE 1 TABLET (80 MG TOTAL) BY MOUTH DAILY AT 6 PM. 90 tablet 3  . carvedilol (COREG) 6.25 MG tablet Take 1 tablet (6.25 mg total) by mouth 2 (two) times daily with a meal. 60 tablet 3  . ENTRESTO 24-26 MG TAKE 1 TABLET BY MOUTH TWICE A DAY 60 tablet 3  . pantoprazole (PROTONIX) 40 MG tablet Take 1 tablet (40 mg total) by mouth daily. 90 tablet 3  . spironolactone (ALDACTONE) 25 MG tablet TAKE 1 TABLET (25 MG TOTAL) BY MOUTH DAILY. 30 tablet 6   No current facility-administered medications for this encounter.     Allergies  Allergen Reactions  . Amoxicillin Swelling    Eye swelling  . Lasix [Furosemide] Swelling    Eye swelling      Social History   Socioeconomic History  . Marital status: Married    Spouse  name: Not on file  . Number of children: Not on file  . Years of education: Not on file  . Highest education level: Not on file  Occupational History  . Not on file  Social Needs  . Financial resource strain: Not on file  . Food insecurity:    Worry: Not on file    Inability: Not on file  . Transportation needs:    Medical: Not on file    Non-medical: Not on file  Tobacco Use  . Smoking status: Former Smoker    Packs/day: 2.00    Years: 20.00    Pack years: 40.00    Last attempt to quit: 03/20/2012    Years since quitting: 5.6  . Smokeless tobacco: Never Used  Substance and Sexual Activity  . Alcohol use: No     Alcohol/week: 0.0 standard drinks  . Drug use: No  . Sexual activity: Not on file  Lifestyle  . Physical activity:    Days per week: Not on file    Minutes per session: Not on file  . Stress: Not on file  Relationships  . Social connections:    Talks on phone: Not on file    Gets together: Not on file    Attends religious service: Not on file    Active member of club or organization: Not on file    Attends meetings of clubs or organizations: Not on file    Relationship status: Not on file  . Intimate partner violence:    Fear of current or ex partner: Not on file    Emotionally abused: Not on file    Physically abused: Not on file    Forced sexual activity: Not on file  Other Topics Concern  . Not on file  Social History Narrative   Patient is married, no children.   Has pets   Works full time at [employer]       Family History  Problem Relation Age of Onset  . Hypertension Father   . Hyperlipidemia Father     Vitals:   11/05/17 1046  BP: (!) 134/96  Pulse: 73  SpO2: 98%  Weight: 78.2 kg (172 lb 6.4 oz)     Wt Readings from Last 3 Encounters:  11/05/17 78.2 kg (172 lb 6.4 oz)  10/23/17 77.1 kg (169 lb 14.4 oz)  03/14/17 83.5 kg (184 lb)     PHYSICAL EXAM: General:  Well appearing. No resp difficulty HEENT: normal Neck: supple. no JVD. Carotids 2+ bilat; no bruits. No lymphadenopathy or thryomegaly appreciated. Cor: PMI nondisplaced. Regular rate & rhythm. No rubs, gallops or murmurs. Lungs: clear Abdomen: soft, nontender, nondistended. No hepatosplenomegaly. No bruits or masses. Good bowel sounds. Extremities: no cyanosis, clubbing, rash, edema Neuro: alert & orientedx3, cranial nerves grossly intact. moves all 4 extremities w/o difficulty. Affect pleasant  EKG: NSR 73 bpm   ASSESSMENT & PLAN: 1. Chronic systolic heart failure - cath 4/17 with normal coronaries. EF 15%. ? Viral CM. - Echo 12/2015 with improvement of EF ~40%. Bedside Echo 2/18   EF25-30% range. cMRI 3/30 LVEF 43% RV normal  - ECHO 10/2017 EF 45-50%  -NYHA - Stable NYHA I -Volume status stable. May need to cut back spiro if renal function elevated.   - Continue spiro 25 mg daily.  - Continue Entresto 24/26 mg BID.    - Continue carvedilol 6.25 bid - Check BMET and BNP today  2. HLD - Cont atorvastatin 80 mg.  PCP folllowing  3. HTN - Blood pressure well controlled. Continue current regimen. 4. GERD - Continue PPI  5. Palpitations? Anxiety versus Arrhythmias.   Place monitor -->Zio Patch for 7 days.   Needs PCP. Provided with a list of PCP practices.   Follow up in 3 months with Dr Haroldine Laws  Greater than 50% of the 25 minute visit was spent in counseling/coordination of care regarding the above.   Darrick Grinder, NP  10:52 AM

## 2017-11-05 ENCOUNTER — Ambulatory Visit (HOSPITAL_COMMUNITY)
Admission: RE | Admit: 2017-11-05 | Discharge: 2017-11-05 | Disposition: A | Discharge status: Home / Self Care | Payer: 59 | Source: Ambulatory Visit | Attending: Internal Medicine | Admitting: Internal Medicine

## 2017-11-05 ENCOUNTER — Ambulatory Visit (HOSPITAL_COMMUNITY): Admission: RE | Admit: 2017-11-05 | Payer: 59 | Source: Ambulatory Visit

## 2017-11-05 ENCOUNTER — Ambulatory Visit (HOSPITAL_COMMUNITY)
Admission: RE | Admit: 2017-11-05 | Discharge: 2017-11-05 | Disposition: A | Discharge status: Home / Self Care | Payer: 59 | Source: Ambulatory Visit | Attending: Adult Health | Admitting: Adult Health

## 2017-11-05 ENCOUNTER — Encounter (HOSPITAL_COMMUNITY): Payer: Self-pay

## 2017-11-05 VITALS — BP 134/96 | HR 73 | Wt 172.4 lb

## 2017-11-05 DIAGNOSIS — K219 Gastro-esophageal reflux disease without esophagitis: Secondary | ICD-10-CM | POA: Diagnosis not present

## 2017-11-05 DIAGNOSIS — Z87891 Personal history of nicotine dependence: Secondary | ICD-10-CM | POA: Diagnosis not present

## 2017-11-05 DIAGNOSIS — E785 Hyperlipidemia, unspecified: Secondary | ICD-10-CM | POA: Diagnosis not present

## 2017-11-05 DIAGNOSIS — I5020 Unspecified systolic (congestive) heart failure: Secondary | ICD-10-CM | POA: Diagnosis not present

## 2017-11-05 DIAGNOSIS — I10 Essential (primary) hypertension: Secondary | ICD-10-CM | POA: Diagnosis not present

## 2017-11-05 DIAGNOSIS — Z7982 Long term (current) use of aspirin: Secondary | ICD-10-CM | POA: Insufficient documentation

## 2017-11-05 DIAGNOSIS — Z8249 Family history of ischemic heart disease and other diseases of the circulatory system: Secondary | ICD-10-CM | POA: Insufficient documentation

## 2017-11-05 DIAGNOSIS — I429 Cardiomyopathy, unspecified: Secondary | ICD-10-CM | POA: Diagnosis not present

## 2017-11-05 DIAGNOSIS — Z79899 Other long term (current) drug therapy: Secondary | ICD-10-CM | POA: Diagnosis not present

## 2017-11-05 DIAGNOSIS — I5022 Chronic systolic (congestive) heart failure: Secondary | ICD-10-CM | POA: Insufficient documentation

## 2017-11-05 DIAGNOSIS — R002 Palpitations: Secondary | ICD-10-CM

## 2017-11-05 DIAGNOSIS — I110 Hypertensive heart disease with heart failure: Secondary | ICD-10-CM | POA: Insufficient documentation

## 2017-11-05 LAB — BASIC METABOLIC PANEL
Anion gap: 5 (ref 5–15)
BUN: 9 mg/dL (ref 6–20)
CO2: 28 mmol/L (ref 22–32)
CREATININE: 0.98 mg/dL (ref 0.61–1.24)
Calcium: 9.3 mg/dL (ref 8.9–10.3)
Chloride: 104 mmol/L (ref 98–111)
GFR calc non Af Amer: >60 mL/min (ref >60)
Glucose, Bld: 115 mg/dL — ABNORMAL HIGH (ref 70–99)
Potassium: 4.1 mmol/L (ref 3.5–5.1)
SODIUM: 137 mmol/L (ref 135–145)

## 2017-11-05 LAB — BRAIN NATRIURETIC PEPTIDE: B Natriuretic Peptide: 9.1 pg/mL (ref 0.0–100.0)

## 2017-11-05 NOTE — Patient Instructions (Signed)
Wear Zio patch for 7 days, then return in provided pre-posted box. Call customer service phone number in booklet with any issues. We will contact you with the results.  EKG today.  Routine lab work today. Will notify you of abnormal results, otherwise no news is good news!  Follow up 3 months with Dr. Haroldine Laws.  ______________________________________________________________________ Jerome Barnett Code: 1800  Take all medication as prescribed the day of your appointment. Bring all medications with you to your appointment.  Do the following things EVERYDAY: 1) Weigh yourself in the morning before breakfast. Write it down and keep it in a log. 2) Take your medicines as prescribed 3) Eat low salt foods-Limit salt (sodium) to 2000 mg per day.  4) Stay as active as you can everyday 5) Limit all fluids for the day to less than 2 liters

## 2017-11-17 DIAGNOSIS — R002 Palpitations: Secondary | ICD-10-CM | POA: Diagnosis not present

## 2017-11-20 MED FILL — SPIRONOLACTONE 25 MG TABLET: 25 | 30 days supply | Qty: 30 | Fill #4

## 2017-11-20 MED FILL — CARVEDILOL 6.25 MG TABLET: 6.25 | 30 days supply | Qty: 60 | Fill #3

## 2017-11-20 MED FILL — PANTOPRAZOLE SOD DR 40 MG T: 40 | 30 days supply | Qty: 30 | Fill #7

## 2017-11-20 MED FILL — ATORVASTATIN 80 MG TABLET: 80 | 30 days supply | Qty: 30 | Fill #5

## 2017-11-21 ENCOUNTER — Telehealth (HOSPITAL_COMMUNITY): Payer: Self-pay | Admitting: Pharmacist

## 2017-11-21 NOTE — Telephone Encounter (Signed)
Entresto PA approved by [facility] through 11/21/18.   Ruta Hinds. Velva Harman, PharmD, BCPS, CPP Clinical Pharmacist Phone: [phone] 11/21/2017 10:23 AM

## 2017-11-22 MED FILL — ENTRESTO 24 MG-26 MG TABLET: 24-26 | 30 days supply | Qty: 60 | Fill #3

## 2017-12-19 ENCOUNTER — Encounter (HOSPITAL_COMMUNITY): Payer: Self-pay | Admitting: Internal Medicine

## 2017-12-19 ENCOUNTER — Other Ambulatory Visit: Payer: Self-pay

## 2017-12-19 ENCOUNTER — Ambulatory Visit (HOSPITAL_COMMUNITY)
Admission: RE | Admit: 2017-12-19 | Discharge: 2017-12-19 | Disposition: A | Discharge status: Home / Self Care | Payer: 59 | Source: Ambulatory Visit | Attending: Internal Medicine | Admitting: Internal Medicine

## 2017-12-19 VITALS — BP 120/79 | HR 80 | Wt 180.4 lb

## 2017-12-19 DIAGNOSIS — R002 Palpitations: Secondary | ICD-10-CM | POA: Diagnosis not present

## 2017-12-19 DIAGNOSIS — K219 Gastro-esophageal reflux disease without esophagitis: Secondary | ICD-10-CM | POA: Diagnosis not present

## 2017-12-19 DIAGNOSIS — Z880 Allergy status to penicillin: Secondary | ICD-10-CM | POA: Diagnosis not present

## 2017-12-19 DIAGNOSIS — Z87891 Personal history of nicotine dependence: Secondary | ICD-10-CM | POA: Insufficient documentation

## 2017-12-19 DIAGNOSIS — E785 Hyperlipidemia, unspecified: Secondary | ICD-10-CM | POA: Insufficient documentation

## 2017-12-19 DIAGNOSIS — Z79899 Other long term (current) drug therapy: Secondary | ICD-10-CM | POA: Diagnosis not present

## 2017-12-19 DIAGNOSIS — I5022 Chronic systolic (congestive) heart failure: Secondary | ICD-10-CM | POA: Diagnosis not present

## 2017-12-19 DIAGNOSIS — Z7982 Long term (current) use of aspirin: Secondary | ICD-10-CM | POA: Diagnosis not present

## 2017-12-19 DIAGNOSIS — Z8249 Family history of ischemic heart disease and other diseases of the circulatory system: Secondary | ICD-10-CM | POA: Insufficient documentation

## 2017-12-19 DIAGNOSIS — I110 Hypertensive heart disease with heart failure: Secondary | ICD-10-CM | POA: Insufficient documentation

## 2017-12-19 DIAGNOSIS — I2510 Atherosclerotic heart disease of native coronary artery without angina pectoris: Secondary | ICD-10-CM | POA: Diagnosis not present

## 2017-12-19 MED ORDER — CARVEDILOL 6.25 MG PO TABS: 9.3750 mg | ORAL_TABLET | Freq: Two times a day (BID) | ORAL | 6 refills | Status: DC

## 2017-12-19 MED FILL — CARVEDILOL 6.25 MG TABLET: 6.25 | 30 days supply | Qty: 90 | Fill #0

## 2017-12-19 NOTE — Progress Notes (Signed)
Patient ID: Germany Chelf, male   DOB: [birth year], 47 y.o.   MRN: [id number]    [facility]  NOTE  Primary HF: Dr. Haroldine Laws  PCP: none   HPI: Mr Stamey is a 47 year old male with h/o HL, HTN and GERD who was admitted in 4/17 with acute HF. EF 15%.  Underwent cath in 4/17 normal cors with low filling pressures and normal cardiac output. Procedure complicated by radial artery spasm requiring multiple rounds of verapamil and NTG. Patient then developed shock and was supported with norepi and milrinone transiently. Was discharged home and then readmitted several days later with hypotension in setting of volume depletion due to severe restriction of po intake.    Seen in ED 08/03/15 and found to be orthostatic. Symptoms improved with 500 cc of IVF.   Admitted 10/22/17 with increased dyspnea. ECHO performed and showed EF 45-50% (improved) . Troponin was negative and BNP was not elevated so he was discharged   Was seen in HF Clinic 8/16 with palpitations and dizziness. Around that time was getting separated from his wife and had to put dog down.   Zio patch 9/19 1. Sinus rhythm with rare PACs and PVCs. 2. No significant arrhythmias 3. Diary events of dizziness occasionaly correspond to PVCs but also triggered with NSR  Feeling better. No CP or SOB. Breathing ok. Has not been back to gym. No orthopnea or PND. Palpitations improved. Snores mildly.    Cardiac Studies   ECHO 10/2017  Left ventricle: The cavity size was normal. Wall thickness was   normal. Systolic function was mildly reduced. The estimated   ejection fraction was in the range of 45% to 50%. Diffuse   hypokinesis. The study is not technically sufficient to allow   evaluation of LV diastolic function. GLS: -15% - Aortic valve: There was mild regurgitation. - Mitral valve: There was mild regurgitation. - Left atrium: The atrium was mildly dilated.  Echo 07/04/15 LVEF 10-15%, normal RV ECHO 01/08/16 EF ~40%  normal RV  cMRI  06/16/16  LVEF 43% RV 41% NICM  RHC/LHC 06/2015  RA = 2 RV = 28/0/4 PA = 28/11 (18) PCW = 5 Fick cardiac output/index = 4.3/2.3 PVR = 3.0 WU FA sat = 94% PA sat = 60%, 63% Assessment: 1. Normal coronary arteries 2. Low filling pressures with normal cardiac output 3. Severe NICM with EF 10% by echo 3. Development of severe hypotension and shock in cath lab due to vasodilators to treat radial artery spasm    Current Outpatient Medications  Medication Sig Dispense Refill  . aspirin 81 MG chewable tablet Chew 1 tablet (81 mg total) by mouth daily. 30 tablet 3  . atorvastatin (LIPITOR) 80 MG tablet TAKE 1 TABLET (80 MG TOTAL) BY MOUTH DAILY AT 6 PM. 90 tablet 3  . carvedilol (COREG) 6.25 MG tablet Take 1 tablet (6.25 mg total) by mouth 2 (two) times daily with a meal. 60 tablet 3  . ENTRESTO 24-26 MG TAKE 1 TABLET BY MOUTH TWICE A DAY 60 tablet 3  . pantoprazole (PROTONIX) 40 MG tablet Take 1 tablet (40 mg total) by mouth daily. 90 tablet 3  . spironolactone (ALDACTONE) 25 MG tablet TAKE 1 TABLET (25 MG TOTAL) BY MOUTH DAILY. 30 tablet 6   No current facility-administered medications for this encounter.     Allergies  Allergen Reactions  . Amoxicillin Swelling    Eye swelling  . Lasix [Furosemide] Swelling    Eye swelling  Social History   Socioeconomic History  . Marital status: Married    Spouse name: Not on file  . Number of children: Not on file  . Years of education: Not on file  . Highest education level: Not on file  Occupational History  . Not on file  Social Needs  . Financial resource strain: Not on file  . Food insecurity:    Worry: Not on file    Inability: Not on file  . Transportation needs:    Medical: Not on file    Non-medical: Not on file  Tobacco Use  . Smoking status: Former Smoker    Packs/day: 2.00    Years: 20.00    Pack years: 40.00    Last attempt to quit: 03/20/2012    Years since quitting: 5.7  . Smokeless  tobacco: Never Used  Substance and Sexual Activity  . Alcohol use: No    Alcohol/week: 0.0 standard drinks  . Drug use: No  . Sexual activity: Not on file  Lifestyle  . Physical activity:    Days per week: Not on file    Minutes per session: Not on file  . Stress: Not on file  Relationships  . Social connections:    Talks on phone: Not on file    Gets together: Not on file    Attends religious service: Not on file    Active member of club or organization: Not on file    Attends meetings of clubs or organizations: Not on file    Relationship status: Not on file  . Intimate partner violence:    Fear of current or ex partner: Not on file    Emotionally abused: Not on file    Physically abused: Not on file    Forced sexual activity: Not on file  Other Topics Concern  . Not on file  Social History Narrative   Patient is married, no children.   Has pets   Works full time at [employer]       Family History  Problem Relation Age of Onset  . Hypertension Father   . Hyperlipidemia Father     Vitals:   12/19/17 0919  BP: 120/79  Pulse: 80  SpO2: 99%  Weight: 81.8 kg (180 lb 6 oz)     Wt Readings from Last 3 Encounters:  12/19/17 81.8 kg (180 lb 6 oz)  11/05/17 78.2 kg (172 lb 6.4 oz)  10/23/17 77.1 kg (169 lb 14.4 oz)     PHYSICAL EXAM: General:  Well appearing. No resp difficulty HEENT: normal Neck: supple. no JVD. Carotids 2+ bilat; no bruits. No lymphadenopathy or thryomegaly appreciated. Cor: PMI nondisplaced. Regular rate & rhythm. No rubs, gallops or murmurs. Lungs: clear Abdomen: soft, nontender, nondistended. No hepatosplenomegaly. No bruits or masses. Good bowel sounds. Extremities: no cyanosis, clubbing, rash, edema Neuro: alert & orientedx3, cranial nerves grossly intact. moves all 4 extremities w/o difficulty. Affect pleasant   ASSESSMENT & PLAN: 1. Chronic systolic heart failure - cath 4/17 with normal coronaries. EF 15%. ? Viral CM. - Echo  12/2015 with improvement of EF ~40%. Bedside Echo 2/18  EF25-30% range. cMRI 3/30 LVEF 43% RV normal  - ECHO 10/2017 EF 45-50%  - Doing well. NYHA I - Volume status ok  - Continue spiro 25 mg daily.  - Continue Entresto 24/26 mg BID.    - Increase carvedilol to 9.375 bid  2. HLD - Cont atorvastatin 80 mg.  PCP following  3. HTN -  Blood pressure well controlled. Continue current regimen.  4. GERD - Continue PPI   5. Palpitations - Monitor results reviewed with him. Suspect symptoms related to PVCs due to situational anxiety   Glori Bickers, MD  9:51 AM

## 2017-12-19 NOTE — Addendum Note (Signed)
Encounter addended by: Scarlette Calico, RN on: 12/19/2017 10:04 AM  Actions taken: Order list changed, Sign clinical note

## 2017-12-19 NOTE — Patient Instructions (Signed)
Increase Carvedilol to 9.375 mg (1 & 1/2 tabs) Twice daily   We will contact you in 4 months to schedule your next appointment.

## 2017-12-25 ENCOUNTER — Other Ambulatory Visit (HOSPITAL_COMMUNITY): Payer: Self-pay | Admitting: Internal Medicine

## 2017-12-25 MED FILL — SPIRONOLACTONE 25 MG TABLET: 25 | 30 days supply | Qty: 30 | Fill #5

## 2017-12-25 MED FILL — ATORVASTATIN 80 MG TABLET: 80 | 30 days supply | Qty: 30 | Fill #6

## 2017-12-25 MED FILL — ENTRESTO 24 MG-26 MG TABLET: 24-26 | 30 days supply | Qty: 60 | Fill #0

## 2017-12-25 MED FILL — PANTOPRAZOLE SOD DR 40 MG T: 40 | 30 days supply | Qty: 30 | Fill #8

## 2018-01-24 MED FILL — SPIRONOLACTONE 25 MG TABLET: 25 | 30 days supply | Qty: 30 | Fill #6

## 2018-01-24 MED FILL — ATORVASTATIN 80 MG TABLET: 80 | 30 days supply | Qty: 30 | Fill #7

## 2018-01-24 MED FILL — CARVEDILOL 6.25 MG TABLET: 6.25 | 30 days supply | Qty: 90 | Fill #1

## 2018-01-24 MED FILL — PANTOPRAZOLE SOD DR 40 MG T: 40 | 30 days supply | Qty: 30 | Fill #9

## 2018-01-24 MED FILL — ENTRESTO 24 MG-26 MG TABLET: 24-26 | 30 days supply | Qty: 60 | Fill #1

## 2018-02-26 ENCOUNTER — Other Ambulatory Visit (HOSPITAL_COMMUNITY): Payer: Self-pay | Admitting: Internal Medicine

## 2018-02-26 MED FILL — ENTRESTO 24 MG-26 MG TABLET: 24-26 | 30 days supply | Qty: 60 | Fill #2

## 2018-02-26 MED FILL — PANTOPRAZOLE SOD DR 40 MG T: 40 | 30 days supply | Qty: 30 | Fill #10

## 2018-02-26 MED FILL — SPIRONOLACTONE 25 MG TABLET: 25 | 30 days supply | Qty: 30 | Fill #0

## 2018-02-26 MED FILL — ATORVASTATIN 80 MG TABLET: 80 | 30 days supply | Qty: 30 | Fill #8

## 2018-03-21 ENCOUNTER — Encounter: Payer: Self-pay | Admitting: Emergency Medicine

## 2018-03-21 ENCOUNTER — Ambulatory Visit
Admission: EM | Admit: 2018-03-21 | Discharge: 2018-03-21 | Disposition: A | Discharge status: Home / Self Care | Payer: 59 | Attending: Physician Assistant | Admitting: Physician Assistant

## 2018-03-21 DIAGNOSIS — R112 Nausea with vomiting, unspecified: Secondary | ICD-10-CM | POA: Insufficient documentation

## 2018-03-21 DIAGNOSIS — H6121 Impacted cerumen, right ear: Secondary | ICD-10-CM

## 2018-03-21 MED ORDER — ONDANSETRON HCL 4 MG PO TABS: ORAL_TABLET | ORAL | 0 refills | Status: DC

## 2018-03-21 MED ORDER — MECLIZINE HCL 12.5 MG PO TABS: 12.5000 mg | ORAL_TABLET | Freq: Three times a day (TID) | ORAL | 0 refills | Status: DC | PRN

## 2018-03-21 MED FILL — ONDANSETRON HCL 4 MG TABLET: 4 | 12 days supply | Qty: 12 | Fill #0

## 2018-03-21 NOTE — ED Notes (Signed)
Cerumen removal completed with peroxide to right ear.

## 2018-03-21 NOTE — ED Notes (Signed)
iStat Chem8 Results:  Na: 138 K: 4.3 Cl: 103 ICa: 1.22 TCO2: 24 Glu: 166 BUN: 17 Crea: 1.1 Hct: 41 Hb*: 13.9 AnGap: 16

## 2018-03-21 NOTE — ED Triage Notes (Addendum)
Pt presents to [facility] for assessment of nasal congestion, sore throat, cough, body aches, nausea, emesis, bilateral lower leg pain, and dizziness with sitting since Christmas.  Pt also c/o right ear has diminished sound and sometimes throbs

## 2018-03-21 NOTE — ED Notes (Signed)
Patient able to ambulate independently  

## 2018-03-21 NOTE — ED Provider Notes (Signed)
[facility]    CSN: [id number] Arrival date & time: 03/21/18  1335     History   Chief Complaint Chief Complaint  Patient presents with  . URI    HPI Jerome Barnett is a 48 y.o. male.   The history is provided by the patient. No language interpreter was used.  URI  Presenting symptoms: congestion and cough   Severity:  Moderate Onset quality:  Gradual Duration:  2 weeks Timing:  Constant Progression:  Worsening Chronicity:  New Relieved by:  Nothing Worsened by:  Nothing Ineffective treatments:  None tried Associated symptoms: sinus pain   Risk factors: no sick contacts   Pt reports he has had dizziness every morning when he wakes up.  Pt reports he is nauseated dizzy and vomits in am.  Pt reports only in am.  Pt is able to eat and drink.  Pt has a history of shortness of breath   Past Medical History:  Diagnosis Date  . CHF (congestive heart failure) [facility])     Patient Active Problem List   Diagnosis Date Noted  . Palpitations 10/22/2017  . Near syncope 05/12/2016  . Essential hypertension 09/09/2015  . Heart failure with reduced ejection fraction, NYHA class II ([place]) 07/20/2015  . Compound nevus 07/20/2015  . Swelling of right upper extremity 07/19/2015  . Hyperlipidemia   . TIA (transient ischemic attack) 07/02/2015    Past Surgical History:  Procedure Laterality Date  . CARDIAC CATHETERIZATION N/A 07/05/2015   Procedure: Right/Left Heart Cath and Coronary Angiography;  Surgeon: Jolaine Artist, MD;  Location: [facility];  Service: Cardiovascular;  Laterality: N/A;       Home Medications    Prior to Admission medications   Medication Sig Start Date End Date Taking? Authorizing Provider  aspirin 81 MG chewable tablet Chew 1 tablet (81 mg total) by mouth daily. 07/08/15  Yes Iline Oven, MD  atorvastatin (LIPITOR) 80 MG tablet TAKE 1 TABLET (80 MG TOTAL) BY MOUTH DAILY AT 6 PM. 06/06/17  Yes Bensimhon, Shaune Pascal, MD    carvedilol (COREG) 6.25 MG tablet Take 1.5 tablets (9.375 mg total) by mouth 2 (two) times daily with a meal. 12/19/17  Yes Bensimhon, Shaune Pascal, MD  ENTRESTO 24-26 MG TAKE 1 TABLET BY MOUTH TWICE A DAY 12/25/17  Yes Bensimhon, Shaune Pascal, MD  pantoprazole (PROTONIX) 40 MG tablet Take 1 tablet (40 mg total) by mouth daily. 03/14/17  Yes Bensimhon, Shaune Pascal, MD  spironolactone (ALDACTONE) 25 MG tablet TAKE 1 TABLET BY MOUTH DAILY. 02/26/18  Yes Bensimhon, Shaune Pascal, MD  meclizine (ANTIVERT) 12.5 MG tablet Take 1 tablet (12.5 mg total) by mouth 3 (three) times daily as needed for dizziness. 03/21/18   Fransico Meadow, PA-C  ondansetron [facility]) 4 MG tablet Take one in am to prevent vomiting 03/21/18   Fransico Meadow, PA-C    Family History Family History  Problem Relation Age of Onset  . Hypertension Father   . Hyperlipidemia Father     Social History Social History   Tobacco Use  . Smoking status: Former Smoker    Packs/day: 2.00    Years: 20.00    Pack years: 40.00    Last attempt to quit: 03/20/2012    Years since quitting: 6.0  . Smokeless tobacco: Never Used  Substance Use Topics  . Alcohol use: No    Alcohol/week: 0.0 standard drinks  . Drug use: No     Allergies   Amoxicillin  and Lasix [furosemide]   Review of Systems Review of Systems  HENT: Positive for congestion and sinus pain.   Respiratory: Positive for cough.   All other systems reviewed and are negative.    Physical Exam Triage Vital Signs ED Triage Vitals [03/21/18 1343]  Enc Vitals Group     BP 132/89     Pulse Rate 76     Resp 20     Temp 97.6 F (36.4 C)     Temp Source Oral     SpO2 98 %     Weight      Height      Head Circumference      Peak Flow      Pain Score 5     Pain Loc      Pain Edu?      Excl. in [place]?    No data found.  Updated Vital Signs BP 132/89 (BP Location: Left Arm)   Pulse 76   Temp 97.6 F (36.4 C) (Oral)   Resp 20   SpO2 98%   Visual Acuity Right Eye Distance:    Left Eye Distance:   Bilateral Distance:    Right Eye Near:   Left Eye Near:    Bilateral Near:     Physical Exam Vitals signs and nursing note reviewed.  Constitutional:      Appearance: He is well-developed.  HENT:     Head: Normocephalic and atraumatic.     Right Ear: There is impacted cerumen.     Nose: Nose normal.     Mouth/Throat:     Mouth: Mucous membranes are moist.  Eyes:     Conjunctiva/sclera: Conjunctivae normal.  Neck:     Musculoskeletal: Neck supple.  Cardiovascular:     Rate and Rhythm: Normal rate and regular rhythm.     Heart sounds: No murmur.  Pulmonary:     Effort: Pulmonary effort is normal. No respiratory distress.     Breath sounds: Normal breath sounds.  Abdominal:     Palpations: Abdomen is soft.     Tenderness: There is no abdominal tenderness.  Musculoskeletal: Normal range of motion.  Skin:    General: Skin is warm and dry.  Neurological:     General: No focal deficit present.     Mental Status: He is alert.  Psychiatric:        Mood and Affect: Mood normal.      UC Treatments / Results  Labs (all labs ordered are listed, but only abnormal results are displayed) Labs Reviewed  I-STAT CHEM 8, ED    EKG None  Radiology No results found.  Procedures Procedures (including critical care time)  Medications Ordered in UC Medications - No data to display  Initial Impression / Assessment and Plan / UC Course  I have reviewed the triage vital signs and the nursing notes.  Pertinent labs & imaging results that were available during my care of the patient were reviewed by me and considered in my medical decision making (see chart for details).     Istat 8 normal.  RN irrigatted right ear.  Pt reports he feels better.  Pt has had vertigo in the past.  I will try treating pt with antivert and zofran  Final Clinical Impressions(s) / UC Diagnoses   Final diagnoses:  Nausea and vomiting, intractability of vomiting not specified,  unspecified vomiting type     Discharge Instructions     Schedule to see your Physician for evaluation  ED Prescriptions    Medication Sig Dispense Auth. Provider   meclizine (ANTIVERT) 12.5 MG tablet Take 1 tablet (12.5 mg total) by mouth 3 (three) times daily as needed for dizziness. 30 tablet Lewin Pellow K, PA-C   ondansetron (ZOFRAN) 4 MG tablet Take one in am to prevent vomiting 12 tablet Fransico Meadow, [place]     Controlled Substance Prescriptions [place] Controlled Substance Registry consulted? Not Applicable   Fransico Meadow, [place] 03/21/18 1651

## 2018-03-21 NOTE — Discharge Instructions (Signed)
Schedule to see your Physician for evaluation

## 2018-03-28 ENCOUNTER — Other Ambulatory Visit (HOSPITAL_COMMUNITY): Payer: Self-pay | Admitting: Internal Medicine

## 2018-03-28 MED FILL — SPIRONOLACTONE 25 MG TABLET: 25 | 30 days supply | Qty: 30 | Fill #1

## 2018-03-28 MED FILL — ATORVASTATIN 80 MG TABLET: 80 | 30 days supply | Qty: 30 | Fill #9

## 2018-03-28 MED FILL — CARVEDILOL 6.25 MG TABLET: 6.25 | 30 days supply | Qty: 90 | Fill #2

## 2018-03-28 MED FILL — ENTRESTO 24 MG-26 MG TABLET: 24-26 | 30 days supply | Qty: 60 | Fill #3

## 2018-03-28 MED FILL — PANTOPRAZOLE SOD DR 40 MG T: 40 | 31 days supply | Qty: 31 | Fill #0

## 2018-04-10 ENCOUNTER — Encounter: Payer: Self-pay | Admitting: Family Medicine

## 2018-04-12 ENCOUNTER — Encounter: Payer: Self-pay | Admitting: Family Medicine

## 2018-04-12 ENCOUNTER — Encounter: Payer: Self-pay | Admitting: Physician Assistant

## 2018-04-12 ENCOUNTER — Ambulatory Visit: Payer: 59 | Admitting: Family Medicine

## 2018-04-12 ENCOUNTER — Ambulatory Visit (INDEPENDENT_AMBULATORY_CARE_PROVIDER_SITE_OTHER): Payer: 59

## 2018-04-12 VITALS — BP 132/88 | HR 75 | Temp 98.0°F | Resp 17 | Ht 68.0 in | Wt 183.4 lb

## 2018-04-12 DIAGNOSIS — I10 Essential (primary) hypertension: Secondary | ICD-10-CM

## 2018-04-12 DIAGNOSIS — R200 Anesthesia of skin: Secondary | ICD-10-CM

## 2018-04-12 DIAGNOSIS — R0789 Other chest pain: Secondary | ICD-10-CM | POA: Diagnosis not present

## 2018-04-12 DIAGNOSIS — R202 Paresthesia of skin: Secondary | ICD-10-CM | POA: Diagnosis not present

## 2018-04-12 DIAGNOSIS — R0602 Shortness of breath: Secondary | ICD-10-CM

## 2018-04-12 DIAGNOSIS — Z7689 Persons encountering health services in other specified circumstances: Secondary | ICD-10-CM

## 2018-04-12 DIAGNOSIS — R42 Dizziness and giddiness: Secondary | ICD-10-CM

## 2018-04-12 DIAGNOSIS — Z87898 Personal history of other specified conditions: Secondary | ICD-10-CM

## 2018-04-12 DIAGNOSIS — I5020 Unspecified systolic (congestive) heart failure: Secondary | ICD-10-CM

## 2018-04-12 DIAGNOSIS — K920 Hematemesis: Secondary | ICD-10-CM

## 2018-04-12 NOTE — Progress Notes (Signed)
Jerome Barnett, is a 48 y.o. male  ZOX:[id number]  WJX:[id number]  DOB - 08/10/1970  CC:  Chief Complaint  Patient presents with  . Establish Care  . Follow-up    UC 1/2: nausea & vomiting. has stopped taking the Zofran. UC also gave him Meclizine for dizziness. he has stopped that medication but is still having dizziness when he wakes up       HPI: Jerome Barnett is a 48 y.o. male is here today to establish care.   Jerome Barnett has TIA (transient ischemic attack); Hyperlipidemia; Heart failure with reduced ejection fraction, NYHA class II ([place]); and Essential hypertension on their problem list.   Current symptoms Patient complains of of an array of symptoms as follows:  Onset Christmas 12/25: Shortness of breath, is constant. Had improved, now reoccurring while at rest. When active the symptoms of SOB seem to resolve. Complains of heart palpations with chest discomfort occurring regardless of activity. When this occurs he is experiencing generalized paresthesias along with the sensation of generalized numbness. Complains of dizziness which has persisted since Christmas. Seen in the ER and prescribed meclizine which he has not taken. Patient reports a history of  TIA's. He is followed by cardiology and blood pressure has remained well controlled. He denies any recent sharp chest pain, cough, extremity edema, or unintentional weight gain. No prior neurological work-up for TIA.   Complain of chronic nausea with vomiting. Reports occasional blood present in vomitus. Zofran has not helped. He endorses some problems with feeling that food is stuck in his throat. Doesn't endorse burning in chest or abdomen. Takes Protonix without any improvement of nausea.   Current medications: Current Outpatient Medications:  .  aspirin 81 MG chewable tablet, Chew 1 tablet (81 mg total) by mouth daily., Disp: 30 tablet, Rfl: 3 .  atorvastatin (LIPITOR) 80 MG tablet, TAKE 1 TABLET (80 MG TOTAL) BY MOUTH  DAILY AT 6 PM., Disp: 90 tablet, Rfl: 3 .  carvedilol (COREG) 6.25 MG tablet, Take 1.5 tablets (9.375 mg total) by mouth 2 (two) times daily with a meal., Disp: 90 tablet, Rfl: 6 .  ENTRESTO 24-26 MG, TAKE 1 TABLET BY MOUTH TWICE A DAY, Disp: 60 tablet, Rfl: 5 .  pantoprazole (PROTONIX) 40 MG tablet, TAKE 1 TABLET (40 MG TOTAL) BY MOUTH DAILY., Disp: 90 tablet, Rfl: 1 .  spironolactone (ALDACTONE) 25 MG tablet, TAKE 1 TABLET BY MOUTH DAILY., Disp: 30 tablet, Rfl: 6   Pertinent family medical history: family history includes Hyperlipidemia in his father and mother; Hypertension in his father; Irritable bowel syndrome in his father.   Allergies  Allergen Reactions  . Amoxicillin Swelling    Eye swelling  . Lasix [Furosemide] Swelling    Eye swelling    Social History   Socioeconomic History  . Marital status: Married    Spouse name: Not on file  . Number of children: Not on file  . Years of education: Not on file  . Highest education level: Not on file  Occupational History  . Not on file  Social Needs  . Financial resource strain: Not on file  . Food insecurity:    Worry: Not on file    Inability: Not on file  . Transportation needs:    Medical: Not on file    Non-medical: Not on file  Tobacco Use  . Smoking status: Former Smoker    Packs/day: 2.00    Years: 20.00    Pack years: 40.00    Last attempt to  quit: 03/20/2012    Years since quitting: 6.0  . Smokeless tobacco: Never Used  Substance and Sexual Activity  . Alcohol use: No    Alcohol/week: 0.0 standard drinks  . Drug use: No  . Sexual activity: Not on file  Lifestyle  . Physical activity:    Days per week: Not on file    Minutes per session: Not on file  . Stress: Not on file  Relationships  . Social connections:    Talks on phone: Not on file    Gets together: Not on file    Attends religious service: Not on file    Active member of club or organization: Not on file    Attends meetings of clubs or  organizations: Not on file    Relationship status: Not on file  . Intimate partner violence:    Fear of current or ex partner: Not on file    Emotionally abused: Not on file    Physically abused: Not on file    Forced sexual activity: Not on file  Other Topics Concern  . Not on file  Social History Narrative   Patient is married, no children.   Has pets   Works full time at [employer]     Review of Systems: Pertinent negatives listed in HPI   Objective:   Vitals:   04/12/18 0850  BP: 132/88  Pulse: 75  Resp: 17  Temp: 98 F (36.7 C)  SpO2: 96%    BP Readings from Last 3 Encounters:  04/12/18 132/88  03/21/18 132/89  12/19/17 120/79    Filed Weights   04/12/18 0850  Weight: 183 lb 6.4 oz (83.2 kg)      Physical Exam: Constitutional: Patient appears well-developed and well-nourished. No distress. HENT: Normocephalic, atraumatic, External right and left ear normal.  Eyes: Conjunctivae and EOM are normal. PERRLA, no scleral icterus. Neck: Normal ROM. Neck supple. No JVD. No tracheal deviation. No thyromegaly. CVS: RRR, S1/S2 +, no murmurs, no gallops, no carotid bruit.  Pulmonary: Effort and breath sounds normal, no stridor, rhonchi, wheezes, rales.  Abdominal: Soft. BS +, no distension, tenderness, rebound or guarding.  Musculoskeletal: Normal range of motion. No edema and no tenderness.  Neuro: Alert. Normal muscle tone coordination. Normal gait. BUE and BLE strength 5/5. Bilateral hand grips symmetrical. No cranial nerve deficit.  Skin: Skin is warm and dry. No rash noted. Not diaphoretic. No erythema. No pallor. Psychiatric: Normal mood and affect. Behavior, judgment, thought content normal.  Lab Results (prior encounters)  Lab Results  Component Value Date   WBC 6.6 10/22/2017   HGB 14.2 10/22/2017   HCT 42.7 10/22/2017   MCV 90.1 10/22/2017   PLT 224 10/22/2017   Lab Results  Component Value Date   CREATININE 0.98 11/05/2017   BUN 9  11/05/2017   NA 137 11/05/2017   K 4.1 11/05/2017   CL 104 11/05/2017   CO2 28 11/05/2017    Lab Results  Component Value Date   HGBA1C 5.6 10/22/2017       Component Value Date/Time   CHOL 239 (H) 07/02/2015 1700   TRIG 122 07/02/2015 1700   HDL 43 07/02/2015 1700   CHOLHDL 5.6 07/02/2015 1700   VLDL 24 07/02/2015 1700   LDLCALC 172 (H) 07/02/2015 1700        Assessment and plan:  1. Encounter to establish care  2. Essential hypertension, well controlled. We have discussed target BP range and blood pressure goal. I have  advised patient to check BP regularly and to call us back or report to clinic if the numbers are consistently higher than 140/90. We discussed the importance of compliance with medical therapy and DASH diet recommended, consequences of uncontrolled hypertension discussed.  - Continue current BP medications   3. Heart failure with reduced ejection fraction, NYHA class II ([place]) -Continue follow-up with Cardiology    4. Dizziness and giddiness - Ambulatory referral to Neurology Checking the following to rule out anemia  - CBC with Differential - Iron, TIBC and Ferritin Panel  5. Numbness and tingling, generalized  - Ambulatory referral to Neurology  6. Chest discomfort - CBC with Differential - EKG 12-Lead, SR negative ST changes   7. History of prediabetes - Comprehensive metabolic panel - Hemoglobin A1c  8. Shortness of breath - DG Chest 2 View; Future Dg Chest 2 View  Result Date: 04/12/2018 CLINICAL DATA:  Shortness of breath EXAM: CHEST - 2 VIEW COMPARISON:  10/22/2017 FINDINGS: Normal heart size and mediastinal contours. No acute infiltrate or edema. No effusion or pneumothorax. No acute osseous findings. IMPRESSION: Negative chest. Electronically Signed   By: Monte Fantasia M.D.   On: 04/12/2018 10:58    9. Hematemesis with nausea - Ambulatory referral to Gastroenterology     The patient was given clear instructions to go to ER or  return to medical center if symptoms don't improve, worsen or new problems develop. The patient verbalized understanding. The patient was advised  to call and obtain lab results if they haven't heard anything from out office within 7-10 business days.  Molli Barrows, FNP [facility] [address], [place] [address] [phone]: [phone]    This note has been created with Dragon speech recognition software and Engineer, materials. Any transcriptional errors are unintentional.

## 2018-04-12 NOTE — Patient Instructions (Addendum)
Thank you for choosing [facility] at [facility] to be your medical home!    Jerome Barnett was seen by Molli Barrows, FNP today.   Jerome Barnett's primary care provider is Scot Jun, FNP.   For the best care possible, you should try to see Molli Barrows, FNP-C whenever you come to the clinic.   We look forward to seeing you again soon!  If you have any questions about your visit today, please call us at [phone] or feel free to reach your primary care provider via [place].     If you experience worsening shortness of breath, chest pain, dizziness, weakness or numbness go immediately to the ER for evaluation as this could be related to a serious underlying condition.  Schedule a follow-up with your cardiologist for next available appointment. You have been referred to neurology and you will receive a call from their office to schedule an appointment.     Transient Ischemic Attack A transient ischemic attack (TIA) is a "warning stroke" that causes stroke-like symptoms that go away quickly. A TIA does not cause lasting damage to the brain. But having a TIA is a sign that you may be at risk for a stroke. Lifestyle changes and medical treatments can help prevent a stroke. It is important to know the symptoms of a TIA and what to do. Get help right away, even if your symptoms go away. The symptoms of a TIA are the same as those of a stroke. They can happen fast, and they usually go away within minutes or hours. They can include:  Weakness or loss of feeling in your face, arm, or leg. This often happens on one side of your body.  Trouble walking.  Trouble moving your arms or legs.  Trouble talking or understanding what people are saying.  Trouble seeing.  Seeing two of one object (double vision).  Feeling dizzy.  Feeling confused.  Loss of balance or coordination.  Feeling sick to your stomach (nauseous) and throwing up (vomiting).  A very bad  headache for no reason.  What increases the risk? Certain things may make you more likely to have a TIA. Some of these are things that you can change, such as:  Being very overweight (obese).  Using products that contain nicotine or tobacco, such as cigarettes and e-cigarettes.  Taking birth control pills.  Not being active.  Drinking too much alcohol.  Using drugs. Other risk factors include:  Having an irregular heartbeat (atrial fibrillation).  Being African American or Hispanic.  Having had blood clots, stroke, TIA, or heart attack in the past.  Being a woman with a history of high blood pressure in pregnancy (preeclampsia).  Being over the age of 45.  Being male.  Having family history of stroke.  Having the following diseases or conditions: ? High blood pressure. ? High cholesterol. ? Diabetes. ? Heart disease. ? Sickle cell disease. ? Sleep apnea. ? Migraine headache. ? Long-term (chronic) diseases that cause soreness and swelling (inflammation). ? Disorders that affect how your blood clots. Follow these instructions at home: Medicines   Take over-the-counter and prescription medicines only as told by your doctor.  If you were told to take aspirin or another medicine to thin your blood, take it exactly as told by your doctor. ? Taking too much of the medicine can cause bleeding. ? Taking too little of the medicine may not work to treat the problem. Eating and drinking   Eat 5 or more servings  of fruits and vegetables each day.  Follow instructions from your doctor about your diet. You may need to follow a certain diet to help lower your risk of having a stroke. You may need to: ? Eat a diet that is low in fat and salt. ? Eat foods that contain a lot of fiber. ? Limit the amount of carbohydrates and sugar in your diet.  Limit alcohol intake to 1 drink a day for nonpregnant women and 2 drinks a day for men. One drink equals 12 oz of beer, 5 oz of  wine, or 1 oz of hard liquor. General instructions  Keep a healthy weight.  Stay active. Try to get at least 30 minutes of activity on all or most days.  Find out if you have a condition called sleep apnea. Get treatment if needed.  Do not use any products that contain nicotine or tobacco, such as cigarettes and e-cigarettes. If you need help quitting, ask your doctor.  Do not abuse drugs.  Keep all follow-up visits as told by your doctor. This is important. Get help right away if:  You have any signs of stroke. "BE FAST" is an easy way to remember the main warning signs: ? B - Balance. Signs are dizziness, sudden trouble walking, or loss of balance. ? E - Eyes. Signs are trouble seeing or a sudden change in how you see. ? F - Face. Signs are sudden weakness or loss of feeling of the face, or the face or eyelid drooping on one side. ? A - Arms. Signs are weakness or loss of feeling in an arm. This happens suddenly and usually on one side of the body. ? S - Speech. Signs are sudden trouble speaking, slurred speech, or trouble understanding what people say. ? T - Time. Time to call emergency services. Write down what time symptoms started.  You have other signs of stroke, such as: ? A sudden, very bad headache with no known cause. ? Feeling sick to your stomach (nausea). ? Throwing up (vomiting). ? Jerky movements that you cannot control (seizure). These symptoms may be an emergency. Do not wait to see if the symptoms will go away. Get medical help right away. Call your local emergency services (911 in the [place] Do not drive yourself to the hospital. Summary  A transient ischemic attack (TIA) is a "warning stroke" that causes stroke-like symptoms that go away quickly.  A TIA is a medical emergency. Get help right away, even if your symptoms go away.  A TIA does not cause lasting damage to the brain.  Having a TIA is a sign that you may be at risk for a stroke. Lifestyle changes  and medical treatments can help prevent a stroke. This information is not intended to replace advice given to you by your health care provider. Make sure you discuss any questions you have with your health care provider. Document Released: 12/14/2007 Document Revised: 09/11/2017 Document Reviewed: 06/07/2016 Elsevier Interactive Patient Education  2019 [employer]  Warning Signs of a Stroke  A stroke is a medical emergency and should be treated right away-every second counts. A stroke is caused by a decrease or block in blood flow to the brain. When this occurs, certain areas of the brain do not get enough oxygen, and brain cells begin to die. A stroke can lead to brain damage and can sometimes be life-threatening. However, if someone having a stroke gets medical treatment right away, he or she has better chances  of surviving and recovering from the stroke. Being able to recognize the symptoms of a stroke is very important. Types of strokes There are two main types of strokes:  Ischemic strokes. This is the most common type of stroke. These strokes happen when a blood vessel that supplies blood to the brain is being blocked.  Hemorrhagic strokes. These strokes result from bleeding in the brain due to a blood vessel leaking or bursting (rupturing). A transient ischemic attack (TIA) is a "warning stroke" that causes stroke-like symptoms that go away quickly. Unlike a stroke, a TIA does not cause permanent damage to the brain. However, the symptoms of a TIA are the same as a stroke, and they also require medical treatment right away. Having a TIA is a sign that you are at higher risk for a permanent stroke. Warning signs of a stroke The symptoms of stroke may vary and will reflect the part of the brain that is involved. Symptoms usually happen suddenly. "BE FAST" is an easy way to remember the main warning signs of a stroke. B - Balance Signs are dizziness, sudden trouble walking, or loss of  balance. E - Eyes Signs are trouble seeing or a sudden change in vision. F - Face Signs are sudden weakness or numbness of the face, or the face or eyelid drooping on one side. A - Arms Signs are weakness or numbness in an arm. This happens suddenly and usually on one side of the body. S - Speech Signs are sudden trouble speaking, slurred speech, or trouble understanding what people say. T - Time Time to call emergency services. Write down what time symptoms started. Other signs of a stroke Some less common signs of a stroke include:  A sudden, severe headache with no known cause.  Nausea or vomiting.  Seizure. A stroke may be happening even if only one "BE FAST" symptoms is present. These symptoms may represent a serious problem that is an emergency. Do not wait to see if the symptoms will go away. Get medical help right away. Call your local emergency services (911 in the [place] Do not drive yourself to the hospital. Summary  A stroke is a medical emergency and should be treated right away-every second counts.  "BE FAST" is an easy way to remember the main warning signs of a stroke.  Call local emergency services right away if you or someone else has any stroke symptoms, even if the symptoms go away.  Make note of what time the first symptoms appeared. Emergency responders or emergency room staff will need to know this information.  Do not wait to see if symptoms will go away. Call 911 even if only one of the "BE FAST" symptoms appears. This information is not intended to replace advice given to you by your health care provider. Make sure you discuss any questions you have with your health care provider. Document Released: 06/23/2016 Document Revised: 06/23/2016 Document Reviewed: 06/23/2016 Elsevier Interactive Patient Education  2019 [employer].

## 2018-04-13 LAB — COMPREHENSIVE METABOLIC PANEL
A/G RATIO: 1.8 (ref 1.2–2.2)
ALBUMIN: 4.9 g/dL (ref 4.0–5.0)
ALT: 36 IU/L (ref 0–44)
AST: 16 IU/L (ref 0–40)
Alkaline Phosphatase: 52 IU/L (ref 39–117)
BUN / CREAT RATIO: 13 (ref 9–20)
BUN: 13 mg/dL (ref 6–24)
Bilirubin Total: 0.9 mg/dL (ref 0.0–1.2)
CALCIUM: 9.9 mg/dL (ref 8.7–10.2)
CO2: 23 mmol/L (ref 20–29)
Chloride: 101 mmol/L (ref 96–106)
Creatinine, Ser: 0.99 mg/dL (ref 0.76–1.27)
GFR calc Af Amer: 103 mL/min/{1.73_m2} (ref >59)
GFR, EST NON AFRICAN AMERICAN: 89 mL/min/{1.73_m2} (ref >59)
GLOBULIN, TOTAL: 2.7 g/dL (ref 1.5–4.5)
Glucose: 110 mg/dL — ABNORMAL HIGH (ref 65–99)
POTASSIUM: 4.6 mmol/L (ref 3.5–5.2)
Sodium: 140 mmol/L (ref 134–144)
TOTAL PROTEIN: 7.6 g/dL (ref 6.0–8.5)

## 2018-04-13 LAB — IRON,TIBC AND FERRITIN PANEL
FERRITIN: 288 ng/mL (ref 30–400)
IRON: 84 ug/dL (ref 38–169)
Iron Saturation: 31 % (ref 15–55)
Total Iron Binding Capacity: 272 ug/dL (ref 250–450)
UIBC: 188 ug/dL (ref 111–343)

## 2018-04-13 LAB — CBC WITH DIFFERENTIAL/PLATELET
BASOS: 1 %
Basophils Absolute: 0 10*3/uL (ref 0.0–0.2)
EOS (ABSOLUTE): 0.3 10*3/uL (ref 0.0–0.4)
EOS: 5 %
HEMATOCRIT: 42.9 % (ref 37.5–51.0)
Hemoglobin: 14.4 g/dL (ref 13.0–17.7)
IMMATURE GRANULOCYTES: 1 %
Immature Grans (Abs): 0 10*3/uL (ref 0.0–0.1)
LYMPHS ABS: 1.1 10*3/uL (ref 0.7–3.1)
Lymphs: 21 %
MCH: 29.2 pg (ref 26.6–33.0)
MCHC: 33.6 g/dL (ref 31.5–35.7)
MCV: 87 fL (ref 79–97)
Monocytes Absolute: 0.4 10*3/uL (ref 0.1–0.9)
Monocytes: 7 %
Neutrophils Absolute: 3.5 10*3/uL (ref 1.4–7.0)
Neutrophils: 65 %
Platelets: 242 10*3/uL (ref 150–450)
RBC: 4.93 x10E6/uL (ref 4.14–5.80)
RDW: 12.7 % (ref 11.6–15.4)
WBC: 5.3 10*3/uL (ref 3.4–10.8)

## 2018-04-13 LAB — HEMOGLOBIN A1C
Est. average glucose Bld gHb Est-mCnc: 126 mg/dL
Hgb A1c MFr Bld: 6 % — ABNORMAL HIGH (ref 4.8–5.6)

## 2018-04-16 ENCOUNTER — Ambulatory Visit: Payer: 59 | Admitting: Diagnostic Neuroimaging

## 2018-04-16 ENCOUNTER — Encounter: Payer: Self-pay | Admitting: Diagnostic Neuroimaging

## 2018-04-16 VITALS — BP 124/81 | HR 81 | Ht 68.0 in | Wt 182.8 lb

## 2018-04-16 DIAGNOSIS — G459 Transient cerebral ischemic attack, unspecified: Secondary | ICD-10-CM | POA: Diagnosis not present

## 2018-04-16 NOTE — Progress Notes (Signed)
[facility]  PATIENT: Jerome Barnett DOB: 11/07/1970  REFERRING CLINICIAN: Claudine Mouton, FNP HISTORY FROM: patient and chart review  REASON FOR VISIT: new consult    HISTORICAL  CHIEF COMPLAINT:  Chief Complaint  Patient presents with  . New Patient (Initial Visit)    Referred by Lavell Anchors FNP  . Dizziness    Rm, 7, alone.  Happens when sitting (has palpitation, then dizziness. nausea/vomiting. Has seen Cardiology 12/2017 fro CHF  . Numbness    HISTORY OF PRESENT ILLNESS:   48 year old male with history of CHF, here for evaluation of transient dizziness, nausea, right-sided numbness.  Around Christmas time 2019 patient was standing at work, all of a sudden became short of breath, dizzy, lightheaded, numbness on the right side of his body (face arm and leg).  He also felt some numbness in the left side of his body.  He went to sit down and after 5 to 10 minutes symptoms started to slightly improved.  Within 1 hour symptoms were almost resolved but not totally.  Patient had to go home early from work.  Since that time symptoms have continued.  They tend to fluctuate.  He has never been symptom-free since that time.  Symptoms tend to be worse when he is at home late in the evening and sitting down.  Patient had similar symptoms in 2017 and had TIA work-up at that time which was negative.  Patient also has congestive heart failure, ejection fraction 45 to 50%.  Patient has had cardiac monitoring in September 2019 which was negative.  However symptoms currently are more severe occluding shortness of breath and palpitations.  These are occurring intermittently but on a daily basis.  Patient also is been under more stress since summer 2019, going through a divorce.   REVIEW OF SYSTEMS: Full 14 system review of systems performed and negative with exception of: As per HPI.   ALLERGIES: Allergies  Allergen Reactions  . Amoxicillin Swelling    Eye swelling  .  Lasix [Furosemide] Swelling    Eye swelling    HOME MEDICATIONS: Outpatient Medications Prior to Visit  Medication Sig Dispense Refill  . aspirin 81 MG chewable tablet Chew 1 tablet (81 mg total) by mouth daily. 30 tablet 3  . atorvastatin (LIPITOR) 80 MG tablet TAKE 1 TABLET (80 MG TOTAL) BY MOUTH DAILY AT 6 PM. 90 tablet 3  . carvedilol (COREG) 6.25 MG tablet Take 1.5 tablets (9.375 mg total) by mouth 2 (two) times daily with a meal. 90 tablet 6  . ENTRESTO 24-26 MG TAKE 1 TABLET BY MOUTH TWICE A DAY 60 tablet 5  . pantoprazole (PROTONIX) 40 MG tablet TAKE 1 TABLET (40 MG TOTAL) BY MOUTH DAILY. 90 tablet 1  . spironolactone (ALDACTONE) 25 MG tablet TAKE 1 TABLET BY MOUTH DAILY. 30 tablet 6   No facility-administered medications prior to visit.     PAST MEDICAL HISTORY: Past Medical History:  Diagnosis Date  . CHF (congestive heart failure) ([place])     PAST SURGICAL HISTORY: Past Surgical History:  Procedure Laterality Date  . CARDIAC CATHETERIZATION N/A 07/05/2015   Procedure: Right/Left Heart Cath and Coronary Angiography;  Surgeon: Jolaine Artist, MD;  Location: [facility];  Service: Cardiovascular;  Laterality: N/A;    FAMILY HISTORY: Family History  Problem Relation Age of Onset  . Hyperlipidemia Mother   . Hypertension Father   . Hyperlipidemia Father   . Irritable bowel syndrome Father     SOCIAL  HISTORY: Social History   Socioeconomic History  . Marital status: Married    Spouse name: Not on file  . Number of children: Not on file  . Years of education: Not on file  . Highest education level: Not on file  Occupational History  . Not on file  Social Needs  . Financial resource strain: Not on file  . Food insecurity:    Worry: Not on file    Inability: Not on file  . Transportation needs:    Medical: Not on file    Non-medical: Not on file  Tobacco Use  . Smoking status: Former Smoker    Packs/day: 2.00    Years: 20.00    Pack years: 40.00     Last attempt to quit: 03/20/2012    Years since quitting: 6.0  . Smokeless tobacco: Never Used  Substance and Sexual Activity  . Alcohol use: No    Alcohol/week: 0.0 standard drinks  . Drug use: No  . Sexual activity: Not on file  Lifestyle  . Physical activity:    Days per week: Not on file    Minutes per session: Not on file  . Stress: Not on file  Relationships  . Social connections:    Talks on phone: Not on file    Gets together: Not on file    Attends religious service: Not on file    Active member of club or organization: Not on file    Attends meetings of clubs or organizations: Not on file    Relationship status: Not on file  . Intimate partner violence:    Fear of current or ex partner: Not on file    Emotionally abused: Not on file    Physically abused: Not on file    Forced sexual activity: Not on file  Other Topics Concern  . Not on file  Social History Narrative   Patient is separated., no children.   Has pets   Works full time at [employer] EXAM/CONSTITUTIONAL: Vitals:  Vitals:   04/16/18 1313  BP: 124/81  Pulse: 81  Weight: 182 lb 12.8 oz (82.9 kg)  Height: 5\' 8"  (1.727 m)     Body mass index is 27.79 kg/m. Wt Readings from Last 3 Encounters:  04/16/18 182 lb 12.8 oz (82.9 kg)  04/12/18 183 lb 6.4 oz (83.2 kg)  12/19/17 180 lb 6 oz (81.8 kg)     Patient is in no distress; well developed, nourished and groomed; neck is supple  CARDIOVASCULAR:  Examination of carotid arteries is normal; no carotid bruits  Regular rate and rhythm, no murmurs  Examination of peripheral vascular system by observation and palpation is normal  EYES:  Ophthalmoscopic exam of optic discs and posterior segments is normal; no papilledema or hemorrhages  No exam data present  MUSCULOSKELETAL:  Gait, strength, tone, movements noted in Neurologic exam below  NEUROLOGIC: MENTAL STATUS:  No flowsheet data  found.  awake, alert, oriented to person, place and time  recent and remote memory intact  normal attention and concentration  language fluent, comprehension intact, naming intact  fund of knowledge appropriate  CRANIAL NERVE:   2nd - no papilledema on fundoscopic exam  2nd, 3rd, 4th, 6th - pupils equal and reactive to light, visual fields full to confrontation, extraocular muscles intact, no nystagmus  5th - facial sensation symmetric  7th - facial strength symmetric  8th - hearing intact  9th - palate  elevates symmetrically, uvula midline  11th - shoulder shrug symmetric  12th - tongue protrusion midline  MOTOR:   normal bulk and tone, full strength in the BUE, BLE  SENSORY:   normal and symmetric to light touch, temperature, vibration  COORDINATION:   finger-nose-finger, fine finger movements normal  REFLEXES:   deep tendon reflexes present and symmetric  GAIT/STATION:   narrow based gait; romberg is negative     DIAGNOSTIC DATA (LABS, IMAGING, TESTING) - I reviewed patient records, labs, notes, testing and imaging myself where available.  Lab Results  Component Value Date   WBC 5.3 04/12/2018   HGB 14.4 04/12/2018   HCT 42.9 04/12/2018   MCV 87 04/12/2018   PLT 242 04/12/2018      Component Value Date/Time   NA 140 04/12/2018 0956   K 4.6 04/12/2018 0956   CL 101 04/12/2018 0956   CO2 23 04/12/2018 0956   GLUCOSE 110 (H) 04/12/2018 0956   GLUCOSE 115 (H) 11/05/2017 1104   BUN 13 04/12/2018 0956   CREATININE 0.99 04/12/2018 0956   CALCIUM 9.9 04/12/2018 0956   PROT 7.6 04/12/2018 0956   ALBUMIN 4.9 04/12/2018 0956   AST 16 04/12/2018 0956   ALT 36 04/12/2018 0956   ALKPHOS 52 04/12/2018 0956   BILITOT 0.9 04/12/2018 0956   GFRNONAA 89 04/12/2018 0956   GFRAA 103 04/12/2018 0956   Lab Results  Component Value Date   CHOL 239 (H) 07/02/2015   HDL 43 07/02/2015   LDLCALC 172 (H) 07/02/2015   TRIG 122 07/02/2015   CHOLHDL 5.6  07/02/2015   Lab Results  Component Value Date   HGBA1C 6.0 (H) 04/12/2018   No results found for: VITAMINB12 Lab Results  Component Value Date   TSH 1.338 10/22/2017    07/02/15 MRI brain (without) [I reviewed images myself and agree with interpretation. -VRP]  - No acute or focal intracranial abnormalities. - No evidence for large vessel occlusion. - Acute and chronic LEFT maxillary sinusitis.  07/03/15 carotid u/s - No significant extracranial carotid artery stenosis demonstrated. - Vertebrals are patent with antegrade flow.  06/16/16 MRI cardiac 1.  Normal LV size with mild diffuse hypokinesis, EF 43%. 2.  Normal RV size with mildly decreased systolic function, EF 64%. 3. Small area of mid-wall LGE at the mid-inferoseptal RV insertion site. This is a nonspecific finding, often indicative of chronically elevated filling pressures.  10/20/17 echocardiogram - Left ventricle: The cavity size was normal. Wall thickness was   normal. Systolic function was mildly reduced. The estimated   ejection fraction was in the range of 45% to 50%. Diffuse   hypokinesis. The study is not technically sufficient to allow   evaluation of LV diastolic function. GLS: -15% - Aortic valve: There was mild regurgitation. - Mitral valve: There was mild regurgitation. - Left atrium: The atrium was mildly dilated.  11/20/17 Heart monitor 1. Sinus rhythm with rare PACs and PVCs. 2. No significant arrhythmias 3. Diary events of dizziness occasionaly correspond to PVCs but also triggered with NSR      ASSESSMENT AND PLAN  48 y.o. year old male here with new onset shortness of breath, lightheadedness, nausea, dizziness, palpitations, right-sided numbness since December 2019.  Dx: vertebrobasilar insufficiency / TIA / stroke vs hypotension / hypoperfusion vs stress reaction  1. TIA (transient ischemic attack)     PLAN:  DIZZINESS / SOB / RIGHT SIDE NUMBNESS / NAUSEA - check MRI brain, MRA head /  neck (TIA / stroke  workup; eval vertebro-basilar insufficiency) - request sooner follow up with cardiology (for intermittent SOB, palpitations; h/o CHF) in next 1-2 weeks  - reviewed stroke warning signs and instructions to call 911 for acute stroke symptoms  Orders Placed This Encounter  Procedures  . MR BRAIN W WO CONTRAST  . MR MRA HEAD WO CONTRAST  . MR MRA NECK W WO CONTRAST   Return in about 3 months (around 07/16/2018).  I reviewed images, labs, notes, records myself. I summarized findings and reviewed with patient, for this high risk condition (TIA / stroke evaluation) requiring high complexity decision making.     Penni Bombard, MD 3/78/5885, 0:27 PM Certified in Neurology, Neurophysiology and Neuroimaging  [facility] [address] [place], [place] [address] [phone]

## 2018-04-16 NOTE — Patient Instructions (Signed)
DIZZINESS / SHORTNESS OF BREATH / RIGHT SIDE NUMBNESS / NAUSEA - check MRI brain, MRA head / neck (TIA / stroke workup)  - request sooner follow up with cardiology (for intermittent shortness of breath, palpitations; h/o CHF) in next 1-2 weeks   - reviewed stroke warning signs and instructions to call 911 for acute stroke symptoms

## 2018-04-17 ENCOUNTER — Telehealth (HOSPITAL_COMMUNITY): Payer: Self-pay

## 2018-04-17 ENCOUNTER — Telehealth: Payer: Self-pay | Admitting: Diagnostic Neuroimaging

## 2018-04-17 NOTE — Telephone Encounter (Signed)
[employer] Auth: [id number] (exp. 04/17/18 to 06/01/18) [id number] (exp. 04/17/18 to 06/01/18) [id number] (exp. 04/17/18 to 06/01/18)  Order sent to GI. I spoke to the patient and he is aware of this.

## 2018-04-17 NOTE — Telephone Encounter (Signed)
-----   Message from Jolaine Artist, MD sent at 04/16/2018  5:16 PM EST ----- Can we get him into see PA/NP in next few weeks for check in? Neurology would like [place] to see him sooner.  Thanks ----- Message ----- From: Penni Bombard, MD Sent: 04/16/2018   3:07 PM EST To: Jolaine Artist, MD  I saw our mutual patient in my neurology clinic today.  I have included my office note and please feel free to contact me with any questions or concerns.  Sincerely,  Penni Bombard, MD 06/24/9859, 4:83 PM Certified in Neurology, Neurophysiology and Neuroimaging  [facility] [address], [place], [place] [address] [phone]

## 2018-04-17 NOTE — Telephone Encounter (Signed)
Called patient this morning, appointment made for Friday, February 14th, 8:30a.  Pt appreciative. Reminder card mailed.

## 2018-04-23 ENCOUNTER — Ambulatory Visit: Payer: 59 | Admitting: Physician Assistant

## 2018-05-02 MED FILL — ATORVASTATIN 80 MG TABLET: 80 | 30 days supply | Qty: 30 | Fill #10

## 2018-05-02 MED FILL — PANTOPRAZOLE SOD DR 40 MG T: 40 | 31 days supply | Qty: 31 | Fill #1

## 2018-05-02 MED FILL — CARVEDILOL 6.25 MG TABLET: 6.25 | 30 days supply | Qty: 90 | Fill #3

## 2018-05-02 MED FILL — SPIRONOLACTONE 25 MG TABLET: 25 | 30 days supply | Qty: 30 | Fill #2

## 2018-05-02 MED FILL — ENTRESTO 24 MG-26 MG TABLET: 24-26 | 30 days supply | Qty: 60 | Fill #4

## 2018-05-02 NOTE — Progress Notes (Signed)
Patient ID: Jerome Barnett, male   DOB: 05-10-1970, 48 y.o.   MRN: [id number]    [facility]  NOTE  Primary HF: Dr. Haroldine Laws  PCP: none   HPI: Jerome Barnett is a 48 y.o. male with h/o HL, HTN and GERD who was admitted in 4/17 with acute HF. EF 15%.  Underwent cath in 4/17 normal cors with low filling pressures and normal cardiac output. Procedure complicated by radial artery spasm requiring multiple rounds of verapamil and NTG. Patient then developed shock and was supported with norepi and milrinone transiently. Was discharged home and then readmitted several days later with hypotension in setting of volume depletion due to severe restriction of po intake.    Seen in ED 08/03/15 and found to be orthostatic. Symptoms improved with 500 cc of IVF.   Admitted 10/22/17 with increased dyspnea. ECHO performed and showed EF 45-50% (improved) . Troponin was negative and BNP was not elevated so he was discharged   Was seen in HF Clinic 11/02/17 with palpitations and dizziness. Around that time was getting separated from his wife and had to put dog down.   Zio patch 9/19 1. Sinus rhythm with rare PACs and PVCs. 2. No significant arrhythmias 3. Diary events of dizziness occasionaly correspond to PVCs but also triggered with NSR  He presents today for regular follow up. Recently seen by Neurology for TIA like symptoms. Had similar symptoms in 2017 with work up negative. He is feeling OK today. He states he has dizziness on a daily basis, from when he wakes up to when he goes to bed. It is intermittent, and can happen at rest with activity. Notably, he says it is worse when he goes from standing to sitting down. He says he is currently SOB, but maintains conversation with an even tone and no increased respiratory effort. He states sometimes his SOB is worse after eating. Denies indigestion. He has pain in his feet, usually worse when he first wakes up. He has mentioned this to his PCP and  neurology. He is very stressed out about work, and is separated from his wife. His symptoms have been ongoing for months (even since he saw [place] in august). They have waxed and waned over this time. They are not related to his medications. He came off of his medications for a day earlier this year, and had no relief, and actually felt better being back on them.   Feeling better. No CP or SOB. Breathing ok. Has not been back to gym. No orthopnea or PND. Palpitations improved. Snores mildly.   Cardiac Studies   ECHO 10/2017  Left ventricle: The cavity size was normal. Wall thickness was   normal. Systolic function was mildly reduced. The estimated   ejection fraction was in the range of 45% to 50%. Diffuse   hypokinesis. The study is not technically sufficient to allow   evaluation of LV diastolic function. GLS: -15% - Aortic valve: There was mild regurgitation. - Mitral valve: There was mild regurgitation. - Left atrium: The atrium was mildly dilated.  Echo 07/04/15 LVEF 10-15%, normal RV ECHO 01/08/16 EF ~40% normal RV  cMRI  06/16/16  LVEF 43% RV 41% NICM  RHC/LHC 06/2015  RA = 2 RV = 28/0/4 PA = 28/11 (18) PCW = 5 Fick cardiac output/index = 4.3/2.3 PVR = 3.0 WU FA sat = 94% PA sat = 60%, 63% Assessment: 1. Normal coronary arteries 2. Low filling pressures with normal cardiac output 3. Severe NICM with EF  10% by echo 3. Development of severe hypotension and shock in cath lab due to vasodilators to treat radial artery spasm  Review of systems complete and found to be negative unless listed in HPI.    Current Outpatient Medications  Medication Sig Dispense Refill  . aspirin 81 MG chewable tablet Chew 1 tablet (81 mg total) by mouth daily. 30 tablet 3  . atorvastatin (LIPITOR) 80 MG tablet TAKE 1 TABLET (80 MG TOTAL) BY MOUTH DAILY AT 6 PM. 90 tablet 3  . carvedilol (COREG) 6.25 MG tablet Take 1.5 tablets (9.375 mg total) by mouth 2 (two) times daily with a meal. 90 tablet 6  .  ENTRESTO 24-26 MG TAKE 1 TABLET BY MOUTH TWICE A DAY 60 tablet 5  . pantoprazole (PROTONIX) 40 MG tablet TAKE 1 TABLET (40 MG TOTAL) BY MOUTH DAILY. 90 tablet 1  . spironolactone (ALDACTONE) 25 MG tablet TAKE 1 TABLET BY MOUTH DAILY. 30 tablet 6   No current facility-administered medications for this visit.     Allergies  Allergen Reactions  . Amoxicillin Swelling    Eye swelling  . Lasix [Furosemide] Swelling    Eye swelling      Social History   Socioeconomic History  . Marital status: Married    Spouse name: Not on file  . Number of children: Not on file  . Years of education: Not on file  . Highest education level: Not on file  Occupational History  . Not on file  Social Needs  . Financial resource strain: Not on file  . Food insecurity:    Worry: Not on file    Inability: Not on file  . Transportation needs:    Medical: Not on file    Non-medical: Not on file  Tobacco Use  . Smoking status: Former Smoker    Packs/day: 2.00    Years: 20.00    Pack years: 40.00    Last attempt to quit: 03/20/2012    Years since quitting: 6.1  . Smokeless tobacco: Never Used  Substance and Sexual Activity  . Alcohol use: No    Alcohol/week: 0.0 standard drinks  . Drug use: No  . Sexual activity: Not on file  Lifestyle  . Physical activity:    Days per week: Not on file    Minutes per session: Not on file  . Stress: Not on file  Relationships  . Social connections:    Talks on phone: Not on file    Gets together: Not on file    Attends religious service: Not on file    Active member of club or organization: Not on file    Attends meetings of clubs or organizations: Not on file    Relationship status: Not on file  . Intimate partner violence:    Fear of current or ex partner: Not on file    Emotionally abused: Not on file    Physically abused: Not on file    Forced sexual activity: Not on file  Other Topics Concern  . Not on file  Social History Narrative   Patient is  separated., no children.   Has pets   Works full time at [employer]    Family History  Problem Relation Age of Onset  . Hyperlipidemia Mother   . Hypertension Father   . Hyperlipidemia Father   . Irritable bowel syndrome Father    Vitals:   05/03/18 0832 05/03/18 0837  BP: 124/88 (!) 130/98  Pulse: 80  SpO2: 99%   Weight: 81.5 kg (179 lb 9.6 oz)    Wt Readings from Last 3 Encounters:  05/03/18 81.5 kg (179 lb 9.6 oz)  04/16/18 82.9 kg (182 lb 12.8 oz)  04/12/18 83.2 kg (183 lb 6.4 oz)    PHYSICAL EXAM: General: Well appearing. No resp difficulty. HEENT: Normal Neck: Supple. JVP 6-7 cm. Carotids 2+ bilat; no bruits. No thyromegaly or nodule noted. Cor: PMI nondisplaced. RRR, No M/G/R noted Lungs: CTAB, normal effort. Abdomen: Soft, non-tender, non-distended, no HSM. No bruits or masses. +BS  Extremities: No cyanosis, clubbing, or rash. R and LLE no edema.  Neuro: Alert & orientedx3, cranial nerves grossly intact. moves all 4 extremities w/o difficulty. Affect pleasant   ASSESSMENT & PLAN: 1. Chronic systolic heart failure - cath 4/17 with normal coronaries. EF 15%. ? Viral CM. - Echo 12/2015 with improvement of EF ~40%. Bedside Echo 2/18  EF25-30% range. cMRI 3/30 LVEF 43% RV normal  - ECHO 10/2017 EF 45-50%  - NYHA II-III symptoms by his report, but at odd times. He gets most of his work done, and has no SOB with ADLs, but reports SOB after eating.  - Volume status looks OK on exam.   - Continue spiro 25 mg daily.  - Continue Entresto 24/26 mg BID. BMET today.     - Continue carvedilol 6.25 mg BID.   2. HLD - Cont atorvastatin 80 mg. Per PCP.   3. HTN - Meds as above.   4. GERD - Continue PPI. No change.   5. Palpitations - Monitor 11/2017 showed sinus rhythm with rare PACs and PVCs. Diary of events corresponded with PVCs on occasion, but also with NSR.  - BB as above.  - EKG today shows NSR with no intraventricular conduction delay, ectopy, or ST changes.  + Q wave in lead II. Previous cath and cMRI with no ischemia.   6. TIA - Around Christmas 2019. Has been seen for Neurology. Had similar event in 2017. No lasting deficit. MRIs pending.   7. Dizziness - Recent Anemia panel stable.  - Will check TFTs.  - He is very nearsighted and has "constantly blurred vision". Hasn't seen an opthamologist in several years. Recommended he have a vision screening.   His symptoms are varied, and out of proportion to exam. He has no signs of active heart failure, and most recently his EF was near normal. Will repeat Echo for completeness sake. Labs as above. Suspect some of his symptoms are a combination of his mood and his vision. He needs to call and set up a vision screening appointment with his constantly blurred vision. Suspect he needs to wear his glasses all the time. Encouraged to discuss his mood at PCP follow up 05/13/2018.  Shirley Friar, PA-C 12:59 PM  Greater than 50% of the 25 minute visit was spent in counseling/coordination of care regarding disease state education, salt/fluid restriction, sliding scale diuretics, and medication compliance.

## 2018-05-03 ENCOUNTER — Other Ambulatory Visit: Payer: Self-pay

## 2018-05-03 ENCOUNTER — Ambulatory Visit (HOSPITAL_COMMUNITY)
Admission: RE | Admit: 2018-05-03 | Discharge: 2018-05-03 | Disposition: A | Discharge status: Home / Self Care | Payer: 59 | Source: Ambulatory Visit | Attending: Internal Medicine | Admitting: Internal Medicine

## 2018-05-03 ENCOUNTER — Encounter (HOSPITAL_COMMUNITY): Payer: Self-pay

## 2018-05-03 VITALS — BP 130/98 | HR 80 | Wt 179.6 lb

## 2018-05-03 DIAGNOSIS — D649 Anemia, unspecified: Secondary | ICD-10-CM | POA: Diagnosis not present

## 2018-05-03 DIAGNOSIS — Z79899 Other long term (current) drug therapy: Secondary | ICD-10-CM | POA: Diagnosis not present

## 2018-05-03 DIAGNOSIS — I5020 Unspecified systolic (congestive) heart failure: Secondary | ICD-10-CM | POA: Diagnosis not present

## 2018-05-03 DIAGNOSIS — R42 Dizziness and giddiness: Secondary | ICD-10-CM

## 2018-05-03 DIAGNOSIS — Z87891 Personal history of nicotine dependence: Secondary | ICD-10-CM | POA: Diagnosis not present

## 2018-05-03 DIAGNOSIS — Z8349 Family history of other endocrine, nutritional and metabolic diseases: Secondary | ICD-10-CM | POA: Insufficient documentation

## 2018-05-03 DIAGNOSIS — Z888 Allergy status to other drugs, medicaments and biological substances status: Secondary | ICD-10-CM | POA: Diagnosis not present

## 2018-05-03 DIAGNOSIS — E785 Hyperlipidemia, unspecified: Secondary | ICD-10-CM | POA: Insufficient documentation

## 2018-05-03 DIAGNOSIS — R002 Palpitations: Secondary | ICD-10-CM | POA: Insufficient documentation

## 2018-05-03 DIAGNOSIS — I110 Hypertensive heart disease with heart failure: Secondary | ICD-10-CM | POA: Diagnosis not present

## 2018-05-03 DIAGNOSIS — Z7982 Long term (current) use of aspirin: Secondary | ICD-10-CM | POA: Diagnosis not present

## 2018-05-03 DIAGNOSIS — I10 Essential (primary) hypertension: Secondary | ICD-10-CM

## 2018-05-03 DIAGNOSIS — I428 Other cardiomyopathies: Secondary | ICD-10-CM | POA: Insufficient documentation

## 2018-05-03 DIAGNOSIS — Z880 Allergy status to penicillin: Secondary | ICD-10-CM | POA: Diagnosis not present

## 2018-05-03 DIAGNOSIS — Z8249 Family history of ischemic heart disease and other diseases of the circulatory system: Secondary | ICD-10-CM | POA: Insufficient documentation

## 2018-05-03 DIAGNOSIS — I493 Ventricular premature depolarization: Secondary | ICD-10-CM | POA: Diagnosis not present

## 2018-05-03 DIAGNOSIS — K219 Gastro-esophageal reflux disease without esophagitis: Secondary | ICD-10-CM | POA: Diagnosis not present

## 2018-05-03 DIAGNOSIS — I5022 Chronic systolic (congestive) heart failure: Secondary | ICD-10-CM | POA: Insufficient documentation

## 2018-05-03 DIAGNOSIS — I2510 Atherosclerotic heart disease of native coronary artery without angina pectoris: Secondary | ICD-10-CM | POA: Diagnosis not present

## 2018-05-03 DIAGNOSIS — Z8673 Personal history of transient ischemic attack (TIA), and cerebral infarction without residual deficits: Secondary | ICD-10-CM | POA: Diagnosis not present

## 2018-05-03 LAB — BASIC METABOLIC PANEL
ANION GAP: 9 (ref 5–15)
BUN: 9 mg/dL (ref 6–20)
CALCIUM: 9.4 mg/dL (ref 8.9–10.3)
CHLORIDE: 106 mmol/L (ref 98–111)
CO2: 23 mmol/L (ref 22–32)
Creatinine, Ser: 0.98 mg/dL (ref 0.61–1.24)
GFR calc Af Amer: >60 mL/min (ref >60)
GFR calc non Af Amer: >60 mL/min (ref >60)
GLUCOSE: 137 mg/dL — AB (ref 70–99)
POTASSIUM: 4 mmol/L (ref 3.5–5.1)
Sodium: 138 mmol/L (ref 135–145)

## 2018-05-03 LAB — TSH: TSH: 1.105 u[IU]/mL (ref 0.350–4.500)

## 2018-05-03 NOTE — Patient Instructions (Signed)
Lab work done today. We will contact you for any abnormal lab work.  EKG done today.  Your physician has requested that you have an echocardiogram. Echocardiography is a painless test that uses sound waves to create images of your heart. It provides your doctor with information about the size and shape of your heart and how well your heart's chambers and valves are working. This procedure takes approximately one hour. There are no restrictions for this procedure.  Follow up with Dr. Haroldine Laws in 4-6 months

## 2018-05-04 LAB — T3, FREE: T3, Free: 3.5 pg/mL (ref 2.0–4.4)

## 2018-05-13 ENCOUNTER — Ambulatory Visit (INDEPENDENT_AMBULATORY_CARE_PROVIDER_SITE_OTHER): Payer: 59 | Admitting: Family Medicine

## 2018-05-13 ENCOUNTER — Encounter: Payer: Self-pay | Admitting: Family Medicine

## 2018-05-13 VITALS — BP 116/80 | HR 90 | Resp 17 | Ht 68.0 in | Wt 179.0 lb

## 2018-05-13 DIAGNOSIS — R112 Nausea with vomiting, unspecified: Secondary | ICD-10-CM

## 2018-05-13 DIAGNOSIS — I5020 Unspecified systolic (congestive) heart failure: Secondary | ICD-10-CM

## 2018-05-13 DIAGNOSIS — Z23 Encounter for immunization: Secondary | ICD-10-CM

## 2018-05-13 DIAGNOSIS — R42 Dizziness and giddiness: Secondary | ICD-10-CM

## 2018-05-13 DIAGNOSIS — I10 Essential (primary) hypertension: Secondary | ICD-10-CM

## 2018-05-13 NOTE — Patient Instructions (Signed)
Follow-up to schedule an eye exam:  [facility] Address: [address], [place], [place] [address] Phone: [phone]  Contact Neurology at [facility] Address: [address], [place], [place] [address] Phone: [phone] To reschedule diagnostic imaging.  You received a tetanus vaccine today. I have included information about this vaccine.    Tdap Vaccine (Tetanus, Diphtheria and Pertussis): What You Need to Know 1. Why get vaccinated? Tetanus, diphtheria and pertussis are very serious diseases. Tdap vaccine can protect [place] from these diseases. And, Tdap vaccine given to pregnant women can protect newborn babies against pertussis.Marland Kitchen TETANUS (Lockjaw) is rare in the [place] today. It causes painful muscle tightening and stiffness, usually all over the body.  It can lead to tightening of muscles in the head and neck so you can't open your mouth, swallow, or sometimes even breathe. Tetanus kills about 1 out of 10 people who are infected even after receiving the best medical care. DIPHTHERIA is also rare in the [place] today. It can cause a thick coating to form in the back of the throat.  It can lead to breathing problems, heart failure, paralysis, and death. PERTUSSIS (Whooping Cough) causes severe coughing spells, which can cause difficulty breathing, vomiting and disturbed sleep.  It can also lead to weight loss, incontinence, and rib fractures. Up to 2 in 100 adolescents and 5 in 100 adults with pertussis are hospitalized or have complications, which could include pneumonia or death. These diseases are caused by bacteria. Diphtheria and pertussis are spread from person to person through secretions from coughing or sneezing. Tetanus enters the body through cuts, scratches, or wounds. Before vaccines, as many as 200,000 cases of diphtheria, 200,000 cases of pertussis, and hundreds of cases of tetanus, were reported in the  [place] each year. Since vaccination began, reports of cases for tetanus and diphtheria have dropped by about 99% and for pertussis by about 80%. 2. Tdap vaccine Tdap vaccine can protect adolescents and adults from tetanus, diphtheria, and pertussis. One dose of Tdap is routinely given at age 45 or 33. People who did not get Tdap at that age should get it as soon as possible. Tdap is especially important for healthcare professionals and anyone having close contact with a baby younger than 12 months. Pregnant women should get a dose of Tdap during every pregnancy, to protect the newborn from pertussis. Infants are most at risk for severe, life-threatening complications from pertussis. Another vaccine, called Td, protects against tetanus and diphtheria, but not pertussis. A Td booster should be given every 10 years. Tdap may be given as one of these boosters if you have never gotten Tdap before. Tdap may also be given after a severe cut or burn to prevent tetanus infection. Your doctor or the person giving you the vaccine can give you more information. Tdap may safely be given at the same time as other vaccines. 3. Some people should not get this vaccine  A person who has ever had a life-threatening allergic reaction after a previous dose of any diphtheria, tetanus or pertussis containing vaccine, OR has a severe allergy to any part of this vaccine, should not get Tdap vaccine. Tell the person giving the vaccine about any severe allergies.  Anyone who had coma or long repeated seizures within 7 days after a childhood dose of DTP or DTaP, or a previous dose of Tdap, should not get Tdap, unless a cause other than the vaccine was found. They  can still get Td.  Talk to your doctor if you: ? have seizures or another nervous system problem, ? had severe pain or swelling after any vaccine containing diphtheria, tetanus or pertussis, ? ever had a condition called Guillain-Barr Syndrome (GBS), ? aren't  feeling well on the day the shot is scheduled. 4. Risks With any medicine, including vaccines, there is a chance of side effects. These are usually mild and go away on their own. Serious reactions are also possible but are rare. Most people who get Tdap vaccine do not have any problems with it. Mild problems following Tdap (Did not interfere with activities)  Pain where the shot was given (about 3 in 4 adolescents or 2 in 3 adults)  Redness or swelling where the shot was given (about 1 person in 5)  Mild fever of at least 100.31F (up to about 1 in 25 adolescents or 1 in 100 adults)  Headache (about 3 or 4 people in 10)  Tiredness (about 1 person in 3 or 4)  Nausea, vomiting, diarrhea, stomach ache (up to 1 in 4 adolescents or 1 in 10 adults)  Chills, sore joints (about 1 person in 10)  Body aches (about 1 person in 3 or 4)  Rash, swollen glands (uncommon) Moderate problems following Tdap (Interfered with activities, but did not require medical attention)  Pain where the shot was given (up to 1 in 5 or 6)  Redness or swelling where the shot was given (up to about 1 in 16 adolescents or 1 in 12 adults)  Fever over 102F (about 1 in 100 adolescents or 1 in 250 adults)  Headache (about 1 in 7 adolescents or 1 in 10 adults)  Nausea, vomiting, diarrhea, stomach ache (up to 1 or 3 people in 100)  Swelling of the entire arm where the shot was given (up to about 1 in 500). Severe problems following Tdap (Unable to perform usual activities; required medical attention)  Swelling, severe pain, bleeding and redness in the arm where the shot was given (rare). Problems that could happen after any vaccine:  People sometimes faint after a medical procedure, including vaccination. Sitting or lying down for about 15 minutes can help prevent fainting, and injuries caused by a fall. Tell your doctor if you feel dizzy, or have vision changes or ringing in the ears.  Some people get severe  pain in the shoulder and have difficulty moving the arm where a shot was given. This happens very rarely.  Any medication can cause a severe allergic reaction. Such reactions from a vaccine are very rare, estimated at fewer than 1 in a million doses, and would happen within a few minutes to a few hours after the vaccination. As with any medicine, there is a very remote chance of a vaccine causing a serious injury or death. The safety of vaccines is always being monitored. For more information, visit: http://www.aguilar.org/ 5. What if there is a serious problem? What should I look for?  Look for anything that concerns you, such as signs of a severe allergic reaction, very high fever, or unusual behavior. Signs of a severe allergic reaction can include hives, swelling of the face and throat, difficulty breathing, a fast heartbeat, dizziness, and weakness. These would usually start a few minutes to a few hours after the vaccination. What should I do?  If you think it is a severe allergic reaction or other emergency that can't wait, call 9-1-1 or get the person to the nearest hospital. Otherwise,  call your doctor.  Afterward, the reaction should be reported to the Vaccine Adverse Event Reporting System (VAERS). Your doctor might file this report, or you can do it yourself through the VAERS web site at www.vaers.SamedayNews.es, or by calling 5127435710. VAERS does not give medical advice. 6. The National Vaccine Injury Compensation Program The [employer] Vaccine Injury Compensation Program (VICP) is a federal program that was created to compensate people who may have been injured by certain vaccines. Persons who believe they may have been injured by a vaccine can learn about the program and about filing a claim by calling 440-621-3960 or visiting the Impact website at GoldCloset.com.ee. There is a time limit to file a claim for compensation. 7. How can I learn more?  Ask your doctor. He  or she can give you the vaccine package insert or suggest other sources of information.  Call your local or state health department.  Contact the Centers for Disease Control and Prevention (CDC): ? Call 925-081-2210 (1-800-CDC-INFO) or ? Visit CDC's website at http://hunter.com/ Vaccine Information Statement Tdap Vaccine (05/13/2013) This information is not intended to replace advice given to you by your health care provider. Make sure you discuss any questions you have with your health care provider. Document Released: 09/05/2011 Document Revised: 10/22/2017 Document Reviewed: 10/22/2017 Elsevier Interactive Patient Education  2019 [employer].

## 2018-05-13 NOTE — Progress Notes (Deleted)
Established Patient Office Visit  Subjective:  Patient ID: Jerome Barnett, male    DOB: 29-Jun-1970  Age: 48 y.o. MRN: [id number]  CC:  Chief Complaint  Patient presents with  . Dizziness    3-4 days/week, less intense only last a few seconds  . Nausea    one instance    HPI Jerome Barnett presents for ***  Past Medical History:  Diagnosis Date  . CHF (congestive heart failure) ([place])     Past Surgical History:  Procedure Laterality Date  . CARDIAC CATHETERIZATION N/A 07/05/2015   Procedure: Right/Left Heart Cath and Coronary Angiography;  Surgeon: Jolaine Artist, MD;  Location: [facility];  Service: Cardiovascular;  Laterality: N/A;    Family History  Problem Relation Age of Onset  . Hyperlipidemia Mother   . Hypertension Father   . Hyperlipidemia Father   . Irritable bowel syndrome Father     Social History   Socioeconomic History  . Marital status: Married    Spouse name: Not on file  . Number of children: Not on file  . Years of education: Not on file  . Highest education level: Not on file  Occupational History  . Not on file  Social Needs  . Financial resource strain: Not on file  . Food insecurity:    Worry: Not on file    Inability: Not on file  . Transportation needs:    Medical: Not on file    Non-medical: Not on file  Tobacco Use  . Smoking status: Former Smoker    Packs/day: 2.00    Years: 20.00    Pack years: 40.00    Last attempt to quit: 03/20/2012    Years since quitting: 6.1  . Smokeless tobacco: Never Used  Substance and Sexual Activity  . Alcohol use: No    Alcohol/week: 0.0 standard drinks  . Drug use: No  . Sexual activity: Not on file  Lifestyle  . Physical activity:    Days per week: Not on file    Minutes per session: Not on file  . Stress: Not on file  Relationships  . Social connections:    Talks on phone: Not on file    Gets together: Not on file    Attends religious service: Not on file    Active  member of club or organization: Not on file    Attends meetings of clubs or organizations: Not on file    Relationship status: Not on file  . Intimate partner violence:    Fear of current or ex partner: Not on file    Emotionally abused: Not on file    Physically abused: Not on file    Forced sexual activity: Not on file  Other Topics Concern  . Not on file  Social History Narrative   Patient is separated., no children.   Has pets   Works full time at [employer]     Outpatient Medications Prior to Visit  Medication Sig Dispense Refill  . aspirin 81 MG chewable tablet Chew 1 tablet (81 mg total) by mouth daily. 30 tablet 3  . atorvastatin (LIPITOR) 80 MG tablet TAKE 1 TABLET (80 MG TOTAL) BY MOUTH DAILY AT 6 PM. 90 tablet 3  . carvedilol (COREG) 6.25 MG tablet Take 1.5 tablets (9.375 mg total) by mouth 2 (two) times daily with a meal. (Patient taking differently: Take 6.25 mg by mouth 2 (two) times daily with a meal. ) 90 tablet 6  . ENTRESTO  24-26 MG TAKE 1 TABLET BY MOUTH TWICE A DAY 60 tablet 5  . pantoprazole (PROTONIX) 40 MG tablet TAKE 1 TABLET (40 MG TOTAL) BY MOUTH DAILY. 90 tablet 1  . spironolactone (ALDACTONE) 25 MG tablet TAKE 1 TABLET BY MOUTH DAILY. 30 tablet 6   No facility-administered medications prior to visit.     Allergies  Allergen Reactions  . Amoxicillin Swelling    Eye swelling  . Lasix [Furosemide] Swelling    Eye swelling    ROS Review of Systems    Objective:    Physical Exam  BP 116/80   Pulse 90   Resp 17   Ht 5\' 8"  (1.727 m)   Wt 179 lb (81.2 kg)   SpO2 96%   BMI 27.22 kg/m  Wt Readings from Last 3 Encounters:  05/13/18 179 lb (81.2 kg)  05/03/18 179 lb 9.6 oz (81.5 kg)  04/16/18 182 lb 12.8 oz (82.9 kg)     Health Maintenance Due  Topic Date Due  . TETANUS/TDAP  12/16/1987    There are no preventive care reminders to display for this patient.  Lab Results  Component Value Date   TSH 1.105 05/03/2018   Lab Results   Component Value Date   WBC 5.3 04/12/2018   HGB 14.4 04/12/2018   HCT 42.9 04/12/2018   MCV 87 04/12/2018   PLT 242 04/12/2018   Lab Results  Component Value Date   NA 138 05/03/2018   K 4.0 05/03/2018   CO2 23 05/03/2018   GLUCOSE 137 (H) 05/03/2018   BUN 9 05/03/2018   CREATININE 0.98 05/03/2018   BILITOT 0.9 04/12/2018   ALKPHOS 52 04/12/2018   AST 16 04/12/2018   ALT 36 04/12/2018   PROT 7.6 04/12/2018   ALBUMIN 4.9 04/12/2018   CALCIUM 9.4 05/03/2018   ANIONGAP 9 05/03/2018   Lab Results  Component Value Date   CHOL 239 (H) 07/02/2015   Lab Results  Component Value Date   HDL 43 07/02/2015   Lab Results  Component Value Date   LDLCALC 172 (H) 07/02/2015   Lab Results  Component Value Date   TRIG 122 07/02/2015   Lab Results  Component Value Date   CHOLHDL 5.6 07/02/2015   Lab Results  Component Value Date   HGBA1C 6.0 (H) 04/12/2018      Assessment & Plan:   No orders of the defined types were placed in this encounter.   Follow-up: No follow-ups on file.    Molli Barrows, FNP

## 2018-05-13 NOTE — Progress Notes (Signed)
Established Patient Office Visit  Subjective:  Patient ID: Jerome Barnett, male    DOB: 1970-08-30  Age: 48 y.o. MRN: [id number]  CC:  Chief Complaint  Patient presents with  . Dizziness    3-4 days/week, less intense only last a few seconds  . Nausea    one instance    HPI Jerome Barnett presents for follow-up of dizziness and nausea.  Patient previously seen in office on 04/12/2018 with multiple complaints including nausea with episodes of vomiting, numbness and tingling, redness of breath and gradually worsening dizziness. Chest x-ray performed in office was negative of anything acute.  Patient was referred urgently to neurology for further evaluation as he has a history of a TIA.  Dr. Leta Baptist ordered an MRI of the neck and brain however patient canceled appointment and needs to follow-up to reschedule.  He reports today dizziness is only occurring 3 to 4 days out of the week.  He continues to have intermittent numbness and tingling of the extremities.  Overall he feels symptoms have improved.  He is not experiencing nausea and has not had any episodes of vomiting.  He is compliant with PPI therapy.  Regarding shortness of breath he has followed up with cardiology and has an echo cardiogram scheduled at the end of the week.  Suffers from CHF with most recent EF 45 to 50% with mildly reduced systolic function.  He is compliant with Entresto therapy. Past Medical History:  Diagnosis Date  . CHF (congestive heart failure) ([place])     Past Surgical History:  Procedure Laterality Date  . CARDIAC CATHETERIZATION N/A 07/05/2015   Procedure: Right/Left Heart Cath and Coronary Angiography;  Surgeon: Jolaine Artist, MD;  Location: [facility];  Service: Cardiovascular;  Laterality: N/A;    Family History  Problem Relation Age of Onset  . Hyperlipidemia Mother   . Hypertension Father   . Hyperlipidemia Father   . Irritable bowel syndrome Father     Social History    Socioeconomic History  . Marital status: Married    Spouse name: Not on file  . Number of children: Not on file  . Years of education: Not on file  . Highest education level: Not on file  Occupational History  . Not on file  Social Needs  . Financial resource strain: Not on file  . Food insecurity:    Worry: Not on file    Inability: Not on file  . Transportation needs:    Medical: Not on file    Non-medical: Not on file  Tobacco Use  . Smoking status: Former Smoker    Packs/day: 2.00    Years: 20.00    Pack years: 40.00    Last attempt to quit: 03/20/2012    Years since quitting: 6.1  . Smokeless tobacco: Never Used  Substance and Sexual Activity  . Alcohol use: No    Alcohol/week: 0.0 standard drinks  . Drug use: No  . Sexual activity: Not on file  Lifestyle  . Physical activity:    Days per week: Not on file    Minutes per session: Not on file  . Stress: Not on file  Relationships  . Social connections:    Talks on phone: Not on file    Gets together: Not on file    Attends religious service: Not on file    Active member of club or organization: Not on file    Attends meetings of clubs or organizations: Not on file  Relationship status: Not on file  . Intimate partner violence:    Fear of current or ex partner: Not on file    Emotionally abused: Not on file    Physically abused: Not on file    Forced sexual activity: Not on file  Other Topics Concern  . Not on file  Social History Narrative   Patient is separated., no children.   Has pets   Works full time at [employer]     Outpatient Medications Prior to Visit  Medication Sig Dispense Refill  . aspirin 81 MG chewable tablet Chew 1 tablet (81 mg total) by mouth daily. 30 tablet 3  . atorvastatin (LIPITOR) 80 MG tablet TAKE 1 TABLET (80 MG TOTAL) BY MOUTH DAILY AT 6 PM. 90 tablet 3  . carvedilol (COREG) 6.25 MG tablet Take 1.5 tablets (9.375 mg total) by mouth 2 (two) times daily with a meal.  (Patient taking differently: Take 6.25 mg by mouth 2 (two) times daily with a meal. ) 90 tablet 6  . ENTRESTO 24-26 MG TAKE 1 TABLET BY MOUTH TWICE A DAY 60 tablet 5  . pantoprazole (PROTONIX) 40 MG tablet TAKE 1 TABLET (40 MG TOTAL) BY MOUTH DAILY. 90 tablet 1  . spironolactone (ALDACTONE) 25 MG tablet TAKE 1 TABLET BY MOUTH DAILY. 30 tablet 6   No facility-administered medications prior to visit.     Allergies  Allergen Reactions  . Amoxicillin Swelling    Eye swelling  . Lasix [Furosemide] Swelling    Eye swelling    ROS Review of Systems Pertinent negatives listed in HPI   Objective:    Physical Exam  BP 116/80   Pulse 90   Resp 17   Ht 5\' 8"  (1.727 m)   Wt 179 lb (81.2 kg)   SpO2 96%   BMI 27.22 kg/m    Physical Exam: Constitutional: Patient appears well-developed and well-nourished. No distress. HENT: Normocephalic, atraumatic, External right and left ear normal. Eyes: Conjunctivae and EOM are normal. PERRLA, no scleral icterus. Neck: Normal ROM. Neck supple. No JVD. No tracheal deviation. No thyromegaly. CVS: RRR, S1/S2 +, no murmurs, no gallops, no carotid bruit.  Pulmonary: Effort and breath sounds normal, no stridor, rhonchi, wheezes, rales.  Abdominal: Soft. BS +, no distension, tenderness, rebound or guarding.  Musculoskeletal: Normal range of motion. No edema and no tenderness.  Lymphadenopathy: No lymphadenopathy noted, cervical, inguinal or axillary Neuro: Alert. Normal reflexes, muscle tone coordination. No cranial nerve deficit. Skin: Skin is warm and dry. No rash noted. Not diaphoretic. No erythema. No pallor. Psychiatric: Normal mood and affect. Behavior, judgment, thought content normal.  Wt Readings from Last 3 Encounters:  05/13/18 179 lb (81.2 kg)  05/03/18 179 lb 9.6 oz (81.5 kg)  04/16/18 182 lb 12.8 oz (82.9 kg)     Health Maintenance Due  Topic Date Due  . TETANUS/TDAP  12/16/1987    There are no preventive care reminders to display  for this patient.  Lab Results  Component Value Date   TSH 1.105 05/03/2018   Lab Results  Component Value Date   WBC 5.3 04/12/2018   HGB 14.4 04/12/2018   HCT 42.9 04/12/2018   MCV 87 04/12/2018   PLT 242 04/12/2018   Lab Results  Component Value Date   NA 138 05/03/2018   K 4.0 05/03/2018   CO2 23 05/03/2018   GLUCOSE 137 (H) 05/03/2018   BUN 9 05/03/2018   CREATININE 0.98 05/03/2018   BILITOT 0.9 04/12/2018  ALKPHOS 52 04/12/2018   AST 16 04/12/2018   ALT 36 04/12/2018   PROT 7.6 04/12/2018   ALBUMIN 4.9 04/12/2018   CALCIUM 9.4 05/03/2018   ANIONGAP 9 05/03/2018   Lab Results  Component Value Date   CHOL 239 (H) 07/02/2015   Lab Results  Component Value Date   HDL 43 07/02/2015   Lab Results  Component Value Date   LDLCALC 172 (H) 07/02/2015   Lab Results  Component Value Date   TRIG 122 07/02/2015   Lab Results  Component Value Date   CHOLHDL 5.6 07/02/2015   Lab Results  Component Value Date   HGBA1C 6.0 (H) 04/12/2018      Assessment & Plan:  1. Essential hypertension, well controlled -no medication adjustments today  2. Dizziness and giddiness -Recommended an eye exam and reschedule ordered MRI recommended by neurologist.  3. Nausea and vomiting, intractability of vomiting not specified, unspecified vomiting type, resolved  -Continue PPI, likely secondary to GERD  4. Heart failure with reduced ejection fraction, NYHA class II ([place]), -continue management by heart failure clinic. ECHO scheduled for the end of week to further evaluate prior complaint of SOB.   5. Need TDAP -administered today   Follow-up: Return in about 6 months (around 11/11/2018) for CPE fasting labs .    Molli Barrows, FNP Primary Care at [facility] [address], [place] [phone]: [phone]

## 2018-05-17 ENCOUNTER — Telehealth (HOSPITAL_COMMUNITY): Payer: Self-pay

## 2018-05-17 ENCOUNTER — Ambulatory Visit (HOSPITAL_COMMUNITY)
Admission: RE | Admit: 2018-05-17 | Discharge: 2018-05-17 | Disposition: A | Discharge status: Home / Self Care | Payer: 59 | Source: Ambulatory Visit | Attending: Cardiology | Admitting: Cardiology

## 2018-05-17 DIAGNOSIS — I5020 Unspecified systolic (congestive) heart failure: Secondary | ICD-10-CM

## 2018-05-17 DIAGNOSIS — I351 Nonrheumatic aortic (valve) insufficiency: Secondary | ICD-10-CM | POA: Diagnosis not present

## 2018-05-17 NOTE — Telephone Encounter (Signed)
Spoke with pt about echo results. Pt grateful for the information.

## 2018-05-17 NOTE — Telephone Encounter (Signed)
-----   Message from Shirley Friar, PA-C sent at 05/17/2018 10:29 AM EST ----- Please let him know his EF is normal, even better than his last Echo. His symptoms are very unlikely to be related to his heart.

## 2018-05-17 NOTE — Progress Notes (Signed)
  Echocardiogram 2D Echocardiogram has been performed.  Jerome Barnett 05/17/2018, 8:46 AM

## 2018-05-20 DIAGNOSIS — H04123 Dry eye syndrome of bilateral lacrimal glands: Secondary | ICD-10-CM | POA: Diagnosis not present

## 2018-05-20 DIAGNOSIS — H40033 Anatomical narrow angle, bilateral: Secondary | ICD-10-CM | POA: Diagnosis not present

## 2018-06-04 MED FILL — SPIRONOLACTONE 25 MG TABLET: 25 | 30 days supply | Qty: 30 | Fill #3 | Status: TO

## 2018-06-04 MED FILL — CARVEDILOL 6.25 MG TABLET: 6.25 | 30 days supply | Qty: 90 | Fill #4 | Status: TO

## 2018-06-04 MED FILL — PANTOPRAZOLE SOD DR 40 MG T: 40 | 31 days supply | Qty: 31 | Fill #2 | Status: TO

## 2018-06-04 MED FILL — ATORVASTATIN 80 MG TABLET: 80 | 30 days supply | Qty: 30 | Fill #11

## 2018-06-04 MED FILL — ENTRESTO 24 MG-26 MG TABLET: 24-26 | 30 days supply | Qty: 60 | Fill #5

## 2018-07-03 ENCOUNTER — Telehealth: Payer: Self-pay | Admitting: *Deleted

## 2018-07-03 NOTE — Telephone Encounter (Signed)
Attempted to call patient x 2 on only # in chart. Immediately rang busy both times. Trying to reach patient to convert FU to video visit.

## 2018-07-08 ENCOUNTER — Other Ambulatory Visit (HOSPITAL_COMMUNITY): Payer: Self-pay | Admitting: Internal Medicine

## 2018-07-08 DIAGNOSIS — I5020 Unspecified systolic (congestive) heart failure: Secondary | ICD-10-CM

## 2018-07-08 MED FILL — SPIRONOLACTONE 25 MG TABS: 25 | 30 days supply | Qty: 30 | Fill #0

## 2018-07-08 MED FILL — PANTOPRAZOLE SOD DR 40 MG T: 40 | 31 days supply | Qty: 31 | Fill #0

## 2018-07-09 MED FILL — ENTRESTO 24 MG-26 MG TABLET: 24-26 | 30 days supply | Qty: 60 | Fill #0

## 2018-07-09 MED FILL — ATORVASTATIN 80 MG TABLET: 80 | 30 days supply | Qty: 30 | Fill #0

## 2018-07-16 NOTE — Telephone Encounter (Signed)
Called patient and advised him due to current COVID 19 pandemic, our office is severely reducing in person visits in order to minimize the risk to our patients and healthcare providers. We recommend to convert your appointment to a video visit. He stated he is doing well, saw his cardiologist, and is unavailable tomorrow or next week for a video visit. He asked to be rescheduled on a Fri in late June. We rescheduled.

## 2018-07-17 ENCOUNTER — Ambulatory Visit: Payer: 59 | Admitting: Diagnostic Neuroimaging

## 2018-08-09 MED FILL — CARVEDILOL 6.25 MG TABLET: 6.25 | 30 days supply | Qty: 90 | Fill #0

## 2018-08-09 MED FILL — ATORVASTATIN 80 MG TABLET: 80 | 30 days supply | Qty: 30 | Fill #1

## 2018-08-09 MED FILL — SPIRONOLACTONE 25 MG TABS: 25 | 30 days supply | Qty: 30 | Fill #1

## 2018-08-09 MED FILL — ENTRESTO 24 MG-26 MG TABLET: 24-26 | 30 days supply | Qty: 60 | Fill #1

## 2018-08-09 MED FILL — PANTOPRAZOLE SOD DR 40 MG T: 40 | 31 days supply | Qty: 31 | Fill #1

## 2018-08-13 ENCOUNTER — Encounter: Payer: Self-pay | Admitting: Emergency Medicine

## 2018-08-13 ENCOUNTER — Other Ambulatory Visit: Payer: Self-pay

## 2018-08-13 ENCOUNTER — Ambulatory Visit
Admission: EM | Admit: 2018-08-13 | Discharge: 2018-08-13 | Disposition: A | Discharge status: Home / Self Care | Payer: 59 | Attending: Physician Assistant | Admitting: Physician Assistant

## 2018-08-13 DIAGNOSIS — M542 Cervicalgia: Secondary | ICD-10-CM | POA: Diagnosis not present

## 2018-08-13 DIAGNOSIS — J029 Acute pharyngitis, unspecified: Secondary | ICD-10-CM

## 2018-08-13 MED ORDER — METHOCARBAMOL 500 MG PO TABS: 500.0000 mg | ORAL_TABLET | Freq: Two times a day (BID) | ORAL | 0 refills | Status: DC

## 2018-08-13 NOTE — Discharge Instructions (Addendum)
No alarming signs on exam at this time. As discussed, would like follow up with PCP for possible snoring causing sleep apnea, which could also cause sore throat. You can take Robaxin as needed, this can make you drowsy, so do not take if you are going to drive, operate heavy machinery, or make important decisions. Ice/heat compresses as needed. Please also follow up with neurology for MRI scheduled. As discussed, please discuss current chest discomfort/palpitations when sleeping with cardiologist. If experiencing chest pain, shortness of breath, palpitations, nausea/vomiting, sweating please go to the ED for further evaluation needed.

## 2018-08-13 NOTE — ED Provider Notes (Signed)
[facility]    CSN: [id number] Arrival date & time: 08/13/18  1021     History   Chief Complaint Chief Complaint  Patient presents with  . Oral Swelling    HPI Jerome Barnett is a 48 y.o. male.   48 year old male with history of TIA, CHF, HTN, HLD, comes in for swelling to the front of the neck that started last night.  He feels that the throat is swollen/tight with mild pain.  He has mild pain with swallowing, without swelling of the tonsils, tripoding, drooling, trismus, changes in voice.  Denies erythema, warmth.  Denies tongue swelling, lip swelling.  Denies rhinorrhea, nasal congestion, cough.  Denies fever, night sweats.  States has had chills intermittently.  Denies any new foods or drinks, denies food allergies.  He feels that he has more difficulty breathing, where he has to take deeper breaths.  He had a insect bite to the right lower neck a few days ago that has not been causing any pain or itching.  Denies any tick bites.  Has dental pain intermittently with no cavities, denies worsening, gum swelling.  He has some bilateral neck pain/tightness.  Denies injury/trauma. He does admit to some palpitation, chest pressure when laying down.  He denies current palpitation, chest pain, shortness of breath.  He denies orthopnea, leg swelling.  Denies acid reflux symptoms.  He does admit to snoring, and does wake up few times a night due to snoring.      Past Medical History:  Diagnosis Date  . CHF (congestive heart failure) [facility])     Patient Active Problem List   Diagnosis Date Noted  . Essential hypertension 09/09/2015  . Heart failure with reduced ejection fraction, NYHA class II ([place]) 07/20/2015  . Hyperlipidemia   . TIA (transient ischemic attack) 07/02/2015    Past Surgical History:  Procedure Laterality Date  . CARDIAC CATHETERIZATION N/A 07/05/2015   Procedure: Right/Left Heart Cath and Coronary Angiography;  Surgeon: Jolaine Artist, MD;   Location: [facility];  Service: Cardiovascular;  Laterality: N/A;       Home Medications    Prior to Admission medications   Medication Sig Start Date End Date Taking? Authorizing Provider  aspirin 81 MG chewable tablet Chew 1 tablet (81 mg total) by mouth daily. 07/08/15  Yes Iline Oven, MD  atorvastatin (LIPITOR) 80 MG tablet TAKE 1 TABLET (80 MG TOTAL) BY MOUTH DAILY AT 6 PM. 07/09/18  Yes Bensimhon, Shaune Pascal, MD  carvedilol (COREG) 6.25 MG tablet Take 1.5 tablets (9.375 mg total) by mouth 2 (two) times daily with a meal. Patient taking differently: Take 6.25 mg by mouth 2 (two) times daily with a meal.  12/19/17  Yes Bensimhon, Shaune Pascal, MD  ENTRESTO 24-26 MG TAKE 1 TABLET BY MOUTH TWICE A DAY 07/09/18  Yes Bensimhon, Shaune Pascal, MD  spironolactone (ALDACTONE) 25 MG tablet TAKE 1 TABLET BY MOUTH DAILY. 02/26/18  Yes Bensimhon, Shaune Pascal, MD  methocarbamol (ROBAXIN) 500 MG tablet Take 1 tablet (500 mg total) by mouth 2 (two) times daily. 08/13/18   Tasia Catchings, Zamia Tyminski V, PA-C  pantoprazole (PROTONIX) 40 MG tablet TAKE 1 TABLET (40 MG TOTAL) BY MOUTH DAILY. 03/28/18   Bensimhon, Shaune Pascal, MD    Family History Family History  Problem Relation Age of Onset  . Hyperlipidemia Mother   . Hypertension Father   . Hyperlipidemia Father   . Irritable bowel syndrome Father     Social History Social History  Tobacco Use  . Smoking status: Former Smoker    Packs/day: 2.00    Years: 20.00    Pack years: 40.00    Last attempt to quit: 03/20/2012    Years since quitting: 6.4  . Smokeless tobacco: Never Used  Substance Use Topics  . Alcohol use: No    Alcohol/week: 0.0 standard drinks  . Drug use: No     Allergies   Amoxicillin and Lasix [furosemide]   Review of Systems Review of Systems  Reason unable to perform ROS: See HPI as above.     Physical Exam Triage Vital Signs ED Triage Vitals [08/13/18 1033]  Enc Vitals Group     BP 118/85     Pulse Rate 80     Resp 18     Temp       Temp Source Oral     SpO2 98 %     Weight      Height      Head Circumference      Peak Flow      Pain Score 5     Pain Loc      Pain Edu?      Excl. in [place]?    No data found.  Updated Vital Signs BP 118/85 (BP Location: Left Arm)   Pulse 80   Temp 98.3 F (36.8 C) (Oral)   Resp 18   SpO2 98%   Physical Exam Constitutional:      General: He is not in acute distress.    Appearance: Normal appearance. He is not ill-appearing, toxic-appearing or diaphoretic.  HENT:     Head: Normocephalic and atraumatic.     Mouth/Throat:     Mouth: Mucous membranes are moist.     Pharynx: Oropharynx is clear. Uvula midline. No posterior oropharyngeal erythema or uvula swelling.     Tonsils: No tonsillar exudate.     Comments: Multiple cavities noted.  Both upper and lower jaw.  No tenderness to palpation of the gums. Eyes:     Extraocular Movements: Extraocular movements intact.     Conjunctiva/sclera: Conjunctivae normal.     Pupils: Pupils are equal, round, and reactive to light.  Neck:     Musculoskeletal: Normal range of motion and neck supple. Muscular tenderness (bilateral) present.     Comments: No neck swelling.  No obvious lymphadenopathy, though tenderness to palpation along sternocleidomastoid.  No erythema or warmth.  Full range of motion of neck. Cardiovascular:     Rate and Rhythm: Normal rate and regular rhythm.     Heart sounds: Normal heart sounds. No murmur. No friction rub. No gallop.   Pulmonary:     Effort: Pulmonary effort is normal. No accessory muscle usage, prolonged expiration, respiratory distress or retractions.     Comments: Patient speaking full sentences without difficulty.  Lungs clear to auscultation without adventitious lung sounds. Musculoskeletal:        General: No swelling or tenderness.     Right lower leg: No edema.     Left lower leg: No edema.  Neurological:     General: No focal deficit present.     Mental Status: He is alert and  oriented to person, place, and time.     GCS: GCS eye subscore is 4. GCS verbal subscore is 5. GCS motor subscore is 6.     Cranial Nerves: Cranial nerves are intact.     Sensory: Sensation is intact.     Motor: Motor function is intact.  Coordination: Coordination is intact.     Gait: Gait is intact.      UC Treatments / Results  Labs (all labs ordered are listed, but only abnormal results are displayed) Labs Reviewed - No data to display  EKG None  Radiology No results found.  Procedures Procedures (including critical care time)  Medications Ordered in UC Medications - No data to display  Initial Impression / Assessment and Plan / UC Course  I have reviewed the triage vital signs and the nursing notes.  Pertinent labs & imaging results that were available during my care of the patient were reviewed by me and considered in my medical decision making (see chart for details).    No alarming signs on exam.  Will provide Robaxin for possible neck strain.  Patient to continue to monitor symptoms, discussed possible early onset of viral symptoms.  Patient with bilateral intermittent hand numbness and dizziness that has been going on for 2 to 3 months, and has urology follow-up for further evaluation.  Currently neurology exam grossly intact without focal deficits.  He also admits to chest pressure and palpitations when laying flat, however without current chest pain, shortness of breath, palpitations.  No orthopnea or leg swelling.  Lungs clear to auscultation bilaterally, lower suspicion for CHF exacerbation.  He does admits to snoring with frequent waking up at night, discussed to follow-up with PCP for further evaluation of possible sleep apnea.  Discussed strict return precautions.  Otherwise patient to follow-up with PCP, cardiologist, neurologist for further evaluation and management needed.  Patient expresses understanding and agrees to plan.  Final Clinical Impressions(s) /  UC Diagnoses   Final diagnoses:  Sore throat  Neck pain    ED Prescriptions    Medication Sig Dispense Auth. Provider   methocarbamol (ROBAXIN) 500 MG tablet Take 1 tablet (500 mg total) by mouth 2 (two) times daily. 20 tablet Tobin Chad, [place] 08/13/18 1135

## 2018-08-13 NOTE — ED Triage Notes (Signed)
Pt presents to [facility] for assessment of 2 months of bilateral hand numbness, c/o dizziness when sitting intermittently for that same amount of time.  Starting last night he began to feel swelling to the front of his neck, and c/o some difficulty breathing, but denies any difficulty swallowing.  C/o bilateral jaw numbness, and some sore throat.

## 2018-08-13 NOTE — ED Notes (Signed)
Patient able to ambulate independently  

## 2018-08-14 NOTE — Telephone Encounter (Signed)
Spoke with patient and updated EMR. 

## 2018-08-19 ENCOUNTER — Encounter: Payer: Self-pay | Admitting: Diagnostic Neuroimaging

## 2018-08-19 ENCOUNTER — Other Ambulatory Visit: Payer: Self-pay

## 2018-08-19 ENCOUNTER — Ambulatory Visit (INDEPENDENT_AMBULATORY_CARE_PROVIDER_SITE_OTHER): Payer: 59 | Admitting: Diagnostic Neuroimaging

## 2018-08-19 DIAGNOSIS — H814 Vertigo of central origin: Secondary | ICD-10-CM | POA: Diagnosis not present

## 2018-08-19 DIAGNOSIS — G459 Transient cerebral ischemic attack, unspecified: Secondary | ICD-10-CM

## 2018-08-19 NOTE — Progress Notes (Signed)
    Virtual Visit via Video Note  I connected with Jerome Barnett on 08/19/18 at  9:30 AM EDT by a video enabled telemedicine application and verified that I am speaking with the correct person using two identifiers.   I discussed the limitations of evaluation and management by telemedicine and the availability of in person appointments. The patient expressed understanding and agreed to proceed.  Patient is at their home. I am at the office.    History of Present Illness:  - since last visit, continues with spells (dizziness; right sided numbness); dozens of spells; lasting 30-60 minutes - has not gone to get MRI/MRA scans done yet -     Observations/Objective:  - awake, alert - face symm - no dysarthria - lang fluent, comprehension intact   05/17/18 TTE  1. The left ventricle has normal systolic function, with an ejection fraction of 55-60%. The cavity size was normal. Left ventricular diastolic parameters were normal.  2. The right ventricle has mildly reduced systolic function. The cavity was mildly enlarged. There is no increase in right ventricular wall thickness.  3. Left atrial size was mildly dilated.  4. The mitral valve is normal in structure. Mild thickening of the mitral valve leaflet. Mild calcification of the mitral valve leaflet.  5. The tricuspid valve is normal in structure.  6. The aortic valve is tricuspid Mild thickening of the aortic valve Sclerosis without any evidence of stenosis of the aortic valve. Aortic valve regurgitation is trivial by color flow Doppler.  7. The pulmonic valve was normal in structure.   Assessment and Plan:  Dx:  1. TIA (transient ischemic attack)   2. Vertigo of central origin      - follow up MRI brain - continue aspirin 81mg , statin - monitor BP, sugar, lipids (per PCP)  Orders Placed This Encounter  Procedures  . MR BRAIN W WO CONTRAST     Follow Up Instructions:  - Return for return to PCP, pending if  symptoms worsen or fail to improve.    I discussed the assessment and treatment plan with the patient. The patient was provided an opportunity to ask questions and all were answered. The patient agreed with the plan and demonstrated an understanding of the instructions.   The patient was advised to call back or seek an in-person evaluation if the symptoms worsen or if the condition fails to improve as anticipated.  I provided 15 minutes of non-face-to-face time during this encounter.   Penni Bombard, MD 08/20/2631, 35:45 AM Certified in Neurology, Neurophysiology and Neuroimaging  [facility] [address], [place], [place] [address] [phone]

## 2018-08-21 ENCOUNTER — Telehealth: Payer: Self-pay | Admitting: Diagnostic Neuroimaging

## 2018-08-21 NOTE — Telephone Encounter (Signed)
[employer] Auth: [id number] (exp. 08/21/18 to 10/05/18) order sent to GI. They will reach out to the patient to schedule.

## 2018-09-02 ENCOUNTER — Other Ambulatory Visit: Payer: Self-pay

## 2018-09-02 ENCOUNTER — Ambulatory Visit (HOSPITAL_COMMUNITY)
Admission: RE | Admit: 2018-09-02 | Discharge: 2018-09-02 | Disposition: A | Discharge status: Home / Self Care | Payer: 59 | Source: Ambulatory Visit | Attending: Internal Medicine | Admitting: Internal Medicine

## 2018-09-02 DIAGNOSIS — R002 Palpitations: Secondary | ICD-10-CM

## 2018-09-02 DIAGNOSIS — I5022 Chronic systolic (congestive) heart failure: Secondary | ICD-10-CM | POA: Diagnosis not present

## 2018-09-02 NOTE — Progress Notes (Addendum)
No changes noted for visit.  AVS mailed to patient. Sent to scheduler to arrange 4 week visit.

## 2018-09-02 NOTE — Patient Instructions (Addendum)
No medication changes today.   You will get a phone call to schedule a 4 week telehealth visit with Dr. Haroldine Laws.  At the [facility], you and your health needs are our priority. As part of our continuing mission to provide you with exceptional heart care, we have created designated Provider Care Teams. These Care Teams include your primary Cardiologist (physician) and Advanced Practice Providers (APPs- Physician Assistants and Nurse Practitioners) who all work together to provide you with the care you need, when you need it.   You may see any of the following providers on your designated Care Team at your next follow up: Marland Kitchen Dr Glori Bickers . Dr Loralie Champagne . Darrick Grinder, NP

## 2018-09-02 NOTE — Addendum Note (Signed)
Encounter addended by: Valeda Malm, RN on: 09/02/2018 11:23 AM  Actions taken: Clinical Note Signed

## 2018-09-02 NOTE — Progress Notes (Signed)
Heart Failure TeleHealth Note  Due to national recommendations of social distancing due to [place] 19, Audio/video telehealth visit is felt to be most appropriate for this patient at this time.  See MyChart message from today for patient consent regarding telehealth for [facility].  Date:  09/02/2018   ID:  Jerome Barnett, DOB [birth year], MRN [id number]  Location: Home  Provider location: [facility] Type of Visit: Established patient  PCP:  Scot Jun, FNP  Cardiologist:  No primary care provider on file. Primary HF: Rebeckah Masih  Chief Complaint: Heart Failure follow-up   History of Present Illness:  Jerome Barnett is a 48 y.o. male with h/o HL, HTN and GERD who was admitted in 4/17 with acute HF. EF 15%.  Underwent cath in 4/17 normal cors with low filling pressures and normal cardiac output. Procedure complicated by radial artery spasm requiring multiple rounds of verapamil and NTG. Patient then developed shock and was supported with norepi and milrinone transiently.   Admitted 10/22/17 with increased dyspnea. ECHO performed and showed EF 45-50% (improved) . Troponin was negative and BNP was not elevated so he was discharged   Was seen in HF Clinic 11/02/17 with palpitations and dizziness. Around that time was getting separated from his wife and had to put dog down.   Zio patch 9/19 1. Sinus rhythm with rare PACs and PVCs. 2. No significant arrhythmias 3. Diary events of dizziness occasionaly correspond to PVCs but also triggered with NSR  He presents via audio/video conferencing for a telehealth visit today.  Recently seen by Neurology for TIA like symptoms and dizziness. Felt to be vertigo. MRI ordered but I do not see in system. He came off of his medications for a day earlier this year, and had no relief, and actually felt better being back on them. Says over past couple months having a hard time breathing, continues with  dizziness (brain MRI for 6/29). Also c/o heart palpitations. Lasts about 10 min or so then go away when he walks away. No edema, orthopnea or PND.    Jerome Barnett denies symptoms worrisome for COVID 19.   Past Medical History:  Diagnosis Date  . CHF (congestive heart failure) ([place])    Past Surgical History:  Procedure Laterality Date  . CARDIAC CATHETERIZATION N/A 07/05/2015   Procedure: Right/Left Heart Cath and Coronary Angiography;  Surgeon: Jolaine Artist, MD;  Location: [facility];  Service: Cardiovascular;  Laterality: N/A;     Current Outpatient Medications  Medication Sig Dispense Refill  . aspirin 81 MG chewable tablet Chew 1 tablet (81 mg total) by mouth daily. 30 tablet 3  . atorvastatin (LIPITOR) 80 MG tablet TAKE 1 TABLET (80 MG TOTAL) BY MOUTH DAILY AT 6 PM. 90 tablet 1  . carvedilol (COREG) 6.25 MG tablet Take 1.5 tablets (9.375 mg total) by mouth 2 (two) times daily with a meal. (Patient taking differently: Take 6.25 mg by mouth 2 (two) times daily with a meal. ) 90 tablet 6  . ENTRESTO 24-26 MG TAKE 1 TABLET BY MOUTH TWICE A DAY 60 tablet 5  . methocarbamol (ROBAXIN) 500 MG tablet Take 1 tablet (500 mg total) by mouth 2 (two) times daily. 20 tablet 0  . pantoprazole (PROTONIX) 40 MG tablet TAKE 1 TABLET (40 MG TOTAL) BY MOUTH DAILY. 90 tablet 1  . spironolactone (ALDACTONE) 25 MG tablet TAKE 1 TABLET BY MOUTH DAILY. 30 tablet 6   No current facility-administered medications  for this encounter.     Allergies:   Amoxicillin and Lasix [furosemide]   Social History:  The patient  reports that he quit smoking about 6 years ago. He has a 40.00 pack-year smoking history. He has never used smokeless tobacco. He reports that he does not drink alcohol or use drugs.   Family History:  The patient's family history includes Hyperlipidemia in his father and mother; Hypertension in his father; Irritable bowel syndrome in his father.   ROS:  Please see the  history of present illness.   All other systems are personally reviewed and negative.   Exam:  (Video/Tele Health Call; Exam is subjective and or/visual.) General:  Speaks in full sentences. No resp difficulty. Lungs: Normal respiratory effort with conversation.  Abdomen: Non-distended per patient report Extremities: Pt denies edema. Neuro: Alert & oriented x 3.   Recent Labs: 11/05/2017: B Natriuretic Peptide 9.1 04/12/2018: ALT 36; Hemoglobin 14.4; Platelets 242 05/03/2018: BUN 9; Creatinine, Ser 0.98; Potassium 4.0; Sodium 138; TSH 1.105  Personally reviewed   Wt Readings from Last 3 Encounters:  05/13/18 81.2 kg (179 lb)  05/03/18 81.5 kg (179 lb 9.6 oz)  04/16/18 82.9 kg (182 lb 12.8 oz)      ASSESSMENT AND PLAN:  1. Chronic systolic heart failure - cath 4/17 with normal coronaries. EF 15%. ? Viral CM. - Echo 12/2015 with improvement of EF ~40%. Bedside Echo 2/18  EF 25-30% range. cMRI 3/30 LVEF 43% RV normal  - ECHO 10/2017 EF 45-50%  - Not feeling very well. But doesn't seem like HF. Given constellation of s/s, I am concerned over panic attacks. I have asked him to contact his PCP to discuss anti-anxiety regimen. I will f/u with him in 4 weeks to see if any improvement  - Volume statussounds stable  - Continue spiro 25 mg daily.  - Continue Entresto 24/26 mg BID. BMET today.     - Continue carvedilol 6.25 mg BID.   2. HLD - Cont atorvastatin 80 mg. Per PCP.   3. HTN - Meds as above.   4. GERD - Continue PPI. No change.   5. Palpitations - Monitor 11/2017 showed sinus rhythm with rare PACs and PVCs. Diary of events corresponded with PVCs on occasion, but also with NSR.  - BB as above.  - Suspect related to panic attacks  6. TIA - Around Christmas 2019. Has been seen by Neurology. Had similar event in 2017. No lasting deficit. Brain MRI pending later this month.    COVID screen The patient does not have any symptoms that suggest any further testing/ screening  at this time.  Social distancing reinforced today.  Recommended follow-up:  As above  Relevant cardiac medications were reviewed at length with the patient today.   The patient does not have concerns regarding their medications at this time.   The following changes were made today:  As above  Today, I have spent 14 minutes with the patient with telehealth technology discussing the above issues .    Signed, Glori Bickers, MD  09/02/2018 9:15 AM  [facility] [place] [address] Heart and Vascular [place] [address] [phone] (office) [phone] (fax)

## 2018-09-10 ENCOUNTER — Other Ambulatory Visit (HOSPITAL_COMMUNITY): Payer: Self-pay

## 2018-09-10 MED ORDER — PANTOPRAZOLE SODIUM 40 MG PO TBEC: 40.0000 mg | DELAYED_RELEASE_TABLET | Freq: Every day | ORAL | 1 refills | Status: DC

## 2018-09-10 MED FILL — ATORVASTATIN 80 MG TABLET: 80 | 30 days supply | Qty: 30 | Fill #2

## 2018-09-10 MED FILL — SPIRONOLACTONE 25 MG TABS: 25 | 30 days supply | Qty: 30 | Fill #2

## 2018-09-10 MED FILL — ENTRESTO 24 MG-26 MG TABLET: 24-26 | 30 days supply | Qty: 60 | Fill #2

## 2018-09-10 MED FILL — PANTOPRAZOLE SOD DR 40 MG T: 40 | 30 days supply | Qty: 30 | Fill #0

## 2018-09-13 ENCOUNTER — Ambulatory Visit: Payer: Self-pay | Admitting: Diagnostic Neuroimaging

## 2018-09-16 ENCOUNTER — Telehealth: Payer: Self-pay | Admitting: *Deleted

## 2018-09-16 ENCOUNTER — Ambulatory Visit
Admission: RE | Admit: 2018-09-16 | Discharge: 2018-09-16 | Disposition: A | Discharge status: Home / Self Care | Payer: 59 | Source: Ambulatory Visit | Attending: Diagnostic Neuroimaging | Admitting: Diagnostic Neuroimaging

## 2018-09-16 DIAGNOSIS — H814 Vertigo of central origin: Secondary | ICD-10-CM | POA: Diagnosis not present

## 2018-09-16 MED ORDER — GADOBENATE DIMEGLUMINE 529 MG/ML IV SOLN
15.0000 mL | Freq: Once | INTRAVENOUS | Status: AC | PRN
  Administered 2018-09-16: 15 mL via INTRAVENOUS

## 2018-09-16 NOTE — Telephone Encounter (Signed)
Spoke with patient and informed him his MRI brain result was normal. He verbalized understanding, appreciation.

## 2018-10-09 ENCOUNTER — Ambulatory Visit (HOSPITAL_COMMUNITY)
Admission: RE | Admit: 2018-10-09 | Discharge: 2018-10-09 | Disposition: A | Discharge status: Home / Self Care | Payer: 59 | Source: Ambulatory Visit | Attending: Internal Medicine | Admitting: Internal Medicine

## 2018-10-09 ENCOUNTER — Other Ambulatory Visit: Payer: Self-pay

## 2018-10-09 ENCOUNTER — Encounter (HOSPITAL_COMMUNITY): Payer: Self-pay | Admitting: *Deleted

## 2018-10-09 DIAGNOSIS — I5022 Chronic systolic (congestive) heart failure: Secondary | ICD-10-CM | POA: Diagnosis not present

## 2018-10-09 DIAGNOSIS — R002 Palpitations: Secondary | ICD-10-CM

## 2018-10-09 DIAGNOSIS — I10 Essential (primary) hypertension: Secondary | ICD-10-CM | POA: Diagnosis not present

## 2018-10-09 DIAGNOSIS — E785 Hyperlipidemia, unspecified: Secondary | ICD-10-CM | POA: Diagnosis not present

## 2018-10-09 DIAGNOSIS — K219 Gastro-esophageal reflux disease without esophagitis: Secondary | ICD-10-CM

## 2018-10-09 NOTE — Patient Instructions (Signed)
We will contact you in 1 year to schedule your next appointment.  If you have any questions or concerns before your next appointment please send [place] a message through [place] or call our office at [phone].  At the [facility], you and your health needs are our priority. As part of our continuing mission to provide you with exceptional heart care, we have created designated Provider Care Teams. These Care Teams include your primary Cardiologist (physician) and Advanced Practice Providers (APPs- Physician Assistants and Nurse Practitioners) who all work together to provide you with the care you need, when you need it.   You may see any of the following providers on your designated Care Team at your next follow up: Marland Kitchen Dr Glori Bickers . Dr Loralie Champagne . Darrick Grinder, NP

## 2018-10-09 NOTE — Progress Notes (Signed)
AVS sent via mychart.

## 2018-10-09 NOTE — Progress Notes (Signed)
Heart Failure TeleHealth Note  Due to national recommendations of social distancing due to [place] 19, Audio/video telehealth visit is felt to be most appropriate for this patient at this time.  See MyChart message from today for patient consent regarding telehealth for Jerome Barnett.  Date:  10/09/2018   ID:  Jerome Barnett, DOB 09-26-1970, MRN [id number]  Location: Home  Provider location: [facility] Type of Visit: Established patient  PCP:  Scot Jun, FNP  Cardiologist:  No primary care provider on file. Primary HF:   Chief Complaint: Heart Failure follow-up   History of Present Illness:  Jerome Barnett is a 48 y.o. male with h/o HL, HTN and GERD who was admitted in 4/17 with acute HF. EF 15%.  Underwent cath in 4/17 normal cors with low filling pressures and normal cardiac output. Procedure complicated by radial artery spasm requiring multiple rounds of verapamil and NTG. Patient then developed shock and was supported with norepi and milrinone transiently.   Admitted 10/22/17 with increased dyspnea. ECHO performed and showed EF 45-50% (improved) . Troponin was negative and BNP was not elevated so he was discharged   Was seen in HF Clinic 11/02/17 with palpitations and dizziness. Around that time was getting separated from his wife and had to put dog down.   Zio patch 9/19 1. Sinus rhythm with rare PACs and PVCs. 2. No significant arrhythmias 3. Diary events of dizziness occasionaly correspond to PVCs but also triggered with NSR  Echo 2/20: EF 55-60% Brain MRI 6/20: Normal  He presents via audio/video conferencing for a telehealth visit today. I talked to him several weeks ago and wasn't feeling well - having palpitations and SOB. I discussed with him my concern that he may be having panic attacks. He says since that call he has been thinking more positive and now feels great. Exercising without any SOB or other  problems. Brain MRI recently negative. No CP, edema, orthopnea or PND.   Jerome Barnett denies symptoms worrisome for COVID 19.   Past Medical History:  Diagnosis Date  . CHF (congestive heart failure) ([place])    Past Surgical History:  Procedure Laterality Date  . CARDIAC CATHETERIZATION N/A 07/05/2015   Procedure: Right/Left Heart Cath and Coronary Angiography;  Surgeon: Jolaine Artist, MD;  Location: [facility];  Service: Cardiovascular;  Laterality: N/A;     Current Outpatient Medications  Medication Sig Dispense Refill  . aspirin 81 MG chewable tablet Chew 1 tablet (81 mg total) by mouth daily. 30 tablet 3  . atorvastatin (LIPITOR) 80 MG tablet TAKE 1 TABLET (80 MG TOTAL) BY MOUTH DAILY AT 6 PM. 90 tablet 1  . carvedilol (COREG) 6.25 MG tablet Take 1.5 tablets (9.375 mg total) by mouth 2 (two) times daily with a meal. (Patient taking differently: Take 6.25 mg by mouth 2 (two) times daily with a meal. ) 90 tablet 6  . ENTRESTO 24-26 MG TAKE 1 TABLET BY MOUTH TWICE A DAY 60 tablet 5  . methocarbamol (ROBAXIN) 500 MG tablet Take 1 tablet (500 mg total) by mouth 2 (two) times daily. 20 tablet 0  . pantoprazole (PROTONIX) 40 MG tablet Take 1 tablet (40 mg total) by mouth daily. 90 tablet 1  . spironolactone (ALDACTONE) 25 MG tablet TAKE 1 TABLET BY MOUTH DAILY. 30 tablet 6   No current facility-administered medications for this encounter.     Allergies:   Amoxicillin and Lasix [furosemide]   Social History:  The patient  reports that he quit smoking about 6 years ago. He has a 40.00 pack-year smoking history. He has never used smokeless tobacco. He reports that he does not drink alcohol or use drugs.   Family History:  The patient's family history includes Hyperlipidemia in his father and mother; Hypertension in his father; Irritable bowel syndrome in his father.   ROS:  Please see the history of present illness.   All other systems are personally reviewed and  negative.   Exam:  (Video/Tele Health Call; Exam is subjective and or/visual.) General:  Speaks in full sentences. No resp difficulty. Lungs: Normal respiratory effort with conversation.  Abdomen: Non-distended per patient report Extremities: Pt denies edema. Neuro: Alert & oriented x 3.   Recent Labs: 11/05/2017: B Natriuretic Peptide 9.1 04/12/2018: ALT 36; Hemoglobin 14.4; Platelets 242 05/03/2018: BUN 9; Creatinine, Ser 0.98; Potassium 4.0; Sodium 138; TSH 1.105  Personally reviewed   Wt Readings from Last 3 Encounters:  05/13/18 81.2 kg (179 lb)  05/03/18 81.5 kg (179 lb 9.6 oz)  04/16/18 82.9 kg (182 lb 12.8 oz)      ASSESSMENT AND PLAN:  1. Chronic systolic heart failure - cath 4/17 with normal coronaries. EF 15%. ? Viral CM. - Echo 12/2015 with improvement of EF ~40%. Bedside Echo 2/18  EF 25-30% range. cMRI 3/30 LVEF 43% RV normal  - Echo 10/2017 EF 45-50%  - Echo 2/20 EF 55-60%  - Volume status stable. Now much improved. NYHA I - Continue spiro 25 mg daily.  - Continue Entresto 24/26 mg BID     - Continue carvedilol 6.25 mg BID   2. HLD - Cont atorvastatin 80 mg. Per PCP.   3. HTN - Meds as above.   4. GERD - Continue PPI. No change.   5. Palpitations - Monitor 11/2017 showed sinus rhythm with rare PACs and PVCs. Diary of events corresponded with PVCs on occasion, but also with NSR.  - BB as above.  - Suspect related to panic attacks. These have now resolved   6. TIA - Around Christmas 2019. Has been seen by Neurology. Had similar event in 2017. No lasting deficit. Brain MRI 6/20 - nornal  COVID screen The patient does not have any symptoms that suggest any further testing/ screening at this time.  Social distancing reinforced today.  Recommended follow-up:  As above  Relevant cardiac medications were reviewed at length with the patient today.   The patient does not have concerns regarding their medications at this time.   The following changes  were made today:  As above  Today, I have spent 7 minutes with the patient with telehealth technology discussing the above issues .    Signed, Glori Bickers, MD  10/09/2018 4:07 PM  Advanced Heart Failure [facility] [address] Heart and [place] [address] [phone] (office) [phone] (fax)

## 2018-10-09 NOTE — Addendum Note (Signed)
Encounter addended by: Scarlette Calico, RN on: 10/09/2018 4:34 PM  Actions taken: Clinical Note Signed

## 2018-10-10 ENCOUNTER — Other Ambulatory Visit (HOSPITAL_COMMUNITY): Payer: Self-pay | Admitting: Internal Medicine

## 2018-10-10 MED FILL — SPIRONOLACTONE 25 MG TABS: 25 | 30 days supply | Qty: 30 | Fill #0

## 2018-10-10 MED FILL — ENTRESTO 24 MG-26 MG TABLET: 24-26 | 30 days supply | Qty: 60 | Fill #3

## 2018-10-10 MED FILL — CARVEDILOL 6.25 MG TABLET: 6.25 | 30 days supply | Qty: 90 | Fill #1

## 2018-10-10 MED FILL — ATORVASTATIN 80 MG TABLET: 80 | 30 days supply | Qty: 30 | Fill #3

## 2018-10-10 MED FILL — PANTOPRAZOLE SOD DR 40 MG T: 40 | 30 days supply | Qty: 30 | Fill #1

## 2018-10-25 ENCOUNTER — Telehealth (HOSPITAL_COMMUNITY): Payer: Self-pay | Admitting: Pharmacy Technician

## 2018-10-25 NOTE — Telephone Encounter (Signed)
Received notification from [place] that prior authorization for Entresto 24-26mg  is required.  PA submitted on CoverMyMeds Key [id number] Status is pending  Will continue to follow.  Charlann Boxer, CPhT

## 2018-10-29 NOTE — Telephone Encounter (Signed)
Advanced Heart Failure Patient Advocate Encounter  Prior Authorization for Entresto 24-26mg  has been approved.    PA# [id number] Effective dates: 10/25/2018 through 10/25/2019  Patients co-pay is $180.00.  Attempted to run claim at [facility] to make sure [name] would go through successfully, it is too soon as patient just picked up on 07/24. Previously patient has had a co-pay card that brings payment to $10 monthly.  Charlann Boxer, CPhT

## 2018-11-08 ENCOUNTER — Telehealth: Payer: Self-pay

## 2018-11-08 NOTE — Telephone Encounter (Signed)
Called patient to do their pre-visit COVID screening.  Have you been tested positive for COVID or are you currently waiting for COVID test results? no  Have you recently traveled internationally([place], [place], [place], [place], [place]) or within the [place] to a hotspot area([place], [place], [place], [place], [place])? no  Are you currently experiencing any of the following symptoms: fever, cough, SHOB, fatigue, body aches, loss of smell, rash, diarrhea, vomiting, severe headaches, weakness, sore throat? no  Have you been in contact with anyone who has recently travelled? no  Have you been in contact with anyone who is experiencing any of the above symptoms or been diagnosed with COVID  or works in or has recently visited a SNF? no  Reminded patient to come fasting for CPE labs.

## 2018-11-11 ENCOUNTER — Other Ambulatory Visit: Payer: Self-pay

## 2018-11-11 ENCOUNTER — Encounter: Payer: Self-pay | Admitting: Family Medicine

## 2018-11-11 ENCOUNTER — Ambulatory Visit (INDEPENDENT_AMBULATORY_CARE_PROVIDER_SITE_OTHER): Payer: No Typology Code available for payment source | Admitting: Family Medicine

## 2018-11-11 VITALS — BP 118/78 | HR 81 | Temp 97.3°F | Resp 17 | Ht 68.0 in | Wt 179.8 lb

## 2018-11-11 DIAGNOSIS — E785 Hyperlipidemia, unspecified: Secondary | ICD-10-CM

## 2018-11-11 DIAGNOSIS — K219 Gastro-esophageal reflux disease without esophagitis: Secondary | ICD-10-CM

## 2018-11-11 DIAGNOSIS — R350 Frequency of micturition: Secondary | ICD-10-CM

## 2018-11-11 DIAGNOSIS — Z8673 Personal history of transient ischemic attack (TIA), and cerebral infarction without residual deficits: Secondary | ICD-10-CM

## 2018-11-11 DIAGNOSIS — I10 Essential (primary) hypertension: Secondary | ICD-10-CM

## 2018-11-11 DIAGNOSIS — I493 Ventricular premature depolarization: Secondary | ICD-10-CM

## 2018-11-11 DIAGNOSIS — Z79899 Other long term (current) drug therapy: Secondary | ICD-10-CM

## 2018-11-11 DIAGNOSIS — R7303 Prediabetes: Secondary | ICD-10-CM

## 2018-11-11 DIAGNOSIS — Z23 Encounter for immunization: Secondary | ICD-10-CM | POA: Diagnosis not present

## 2018-11-11 DIAGNOSIS — I5020 Unspecified systolic (congestive) heart failure: Secondary | ICD-10-CM | POA: Diagnosis not present

## 2018-11-11 DIAGNOSIS — Z7982 Long term (current) use of aspirin: Secondary | ICD-10-CM

## 2018-11-11 LAB — POCT URINALYSIS DIP (CLINITEK)
Bilirubin, UA: NEGATIVE
Blood, UA: NEGATIVE
Glucose, UA: NEGATIVE mg/dL
Ketones, POC UA: NEGATIVE mg/dL
Leukocytes, UA: NEGATIVE
Nitrite, UA: NEGATIVE
POC PROTEIN,UA: NEGATIVE
Spec Grav, UA: >=1.03 — AB
Urobilinogen, UA: 0.2 U/dL
pH, UA: 6

## 2018-11-11 MED ORDER — SPIRONOLACTONE 25 MG PO TABS: 25.0000 mg | ORAL_TABLET | Freq: Every day | ORAL | 5 refills | Status: DC

## 2018-11-11 MED ORDER — ATORVASTATIN CALCIUM 80 MG PO TABS: 80.0000 mg | ORAL_TABLET | Freq: Every day | ORAL | 5 refills | Status: DC

## 2018-11-11 MED ORDER — ENTRESTO 24-26 MG PO TABS: 1.0000 | ORAL_TABLET | Freq: Two times a day (BID) | ORAL | 5 refills | Status: DC

## 2018-11-11 MED ORDER — ASPIRIN 81 MG PO CHEW: CHEWABLE_TABLET | ORAL | 11 refills | Status: AC

## 2018-11-11 MED ORDER — PANTOPRAZOLE SODIUM 40 MG PO TBEC: 40.0000 mg | DELAYED_RELEASE_TABLET | Freq: Every day | ORAL | 11 refills | Status: DC

## 2018-11-11 MED ORDER — METFORMIN HCL 500 MG PO TABS: ORAL_TABLET | ORAL | 5 refills | Status: DC

## 2018-11-11 MED ORDER — CARVEDILOL 6.25 MG PO TABS: 9.3750 mg | ORAL_TABLET | Freq: Two times a day (BID) | ORAL | 6 refills | Status: DC

## 2018-11-11 MED FILL — metFORMIN HCL 500 MG TABS: 500 | 30 days supply | Qty: 30 | Fill #0

## 2018-11-11 MED FILL — PANTOPRAZOLE SOD DR 40 MG T: 40 | 30 days supply | Qty: 30 | Fill #0

## 2018-11-11 MED FILL — CARVEDILOL 6.25 MG TABLET: 6.25 | 30 days supply | Qty: 90 | Fill #0

## 2018-11-11 MED FILL — ATORVASTATIN 80 MG TABLET: 80 | 30 days supply | Qty: 30 | Fill #0

## 2018-11-11 MED FILL — SPIRONOLACTONE 25 MG TABS: 25 | 30 days supply | Qty: 30 | Fill #0

## 2018-11-11 MED FILL — ASPIRIN LOW DOSE 81 MG CHEW: 81 | 30 days supply | Qty: 30 | Fill #0

## 2018-11-11 NOTE — Progress Notes (Signed)
Patient notified of results & recommendations. Expressed understanding.

## 2018-11-11 NOTE — Patient Instructions (Signed)

## 2018-11-11 NOTE — Progress Notes (Signed)
Established Patient Office Visit  Subjective:  Patient ID: Jerome Barnett, male    DOB: September 02, 1970  Age: 48 y.o. MRN: [id number]  CC: No chief complaint on file.   HPI Jerome Barnett presents for follow-up of chronic medical issues.  Patient with medical history significant for congestive heart failure-diagnosed in April 2017, hypertension, hyperlipidemia, history of TIA-2017 and possibly December 2019, GERD and prediabetes.  Patient had hemoglobin A1c in January of this year that was 6.0.  He is not currently on any medications to help with control of his blood sugars and does not monitor his blood sugars.  He admits that he has not made any significant changes in his diet but then later states that he has given up soda and juices and now drinks water throughout the day.  He does have some urinary frequency but he believes that this is due to his water consumption.  He denies increased thirst.  He also denies any nocturia or changes in force of the urinary stream.        In January, patient was having issues with dizziness but this has now resolved.  He was having issues with palpitations for which he was seen by cardiology.  Patient reports no bothersome palpitations at this time.  He reports that he has seen his cardiologist and neurologist since his last visit.  He reports that he will need refills of all of his medications at today's visit.  He continues to take daily aspirin and he denies any abdominal pain, no burping or belching, no blood in the stool and no black stools.  He denies headaches or dizziness related to his blood pressure.  He has had no increased fatigue, no shortness of breath and no swelling in his legs related to his heart failure.  Past Medical History:  Diagnosis Date  . CHF (congestive heart failure) ([place])     Past Surgical History:  Procedure Laterality Date  . CARDIAC CATHETERIZATION N/A 07/05/2015   Procedure: Right/Left Heart Cath and Coronary  Angiography;  Surgeon: Jolaine Artist, MD;  Location: [facility];  Service: Cardiovascular;  Laterality: N/A;    Family History  Problem Relation Age of Onset  . Hyperlipidemia Mother   . Hypertension Father   . Hyperlipidemia Father   . Irritable bowel syndrome Father     Social History   Socioeconomic History  . Marital status: Married    Spouse name: Not on file  . Number of children: Not on file  . Years of education: Not on file  . Highest education level: Not on file  Occupational History  . Not on file  Social Needs  . Financial resource strain: Not on file  . Food insecurity    Worry: Not on file    Inability: Not on file  . Transportation needs    Medical: Not on file    Non-medical: Not on file  Tobacco Use  . Smoking status: Former Smoker    Packs/day: 2.00    Years: 20.00    Pack years: 40.00    Quit date: 03/20/2012    Years since quitting: 6.6  . Smokeless tobacco: Never Used  Substance and Sexual Activity  . Alcohol use: No    Alcohol/week: 0.0 standard drinks  . Drug use: No  . Sexual activity: Not on file  Lifestyle  . Physical activity    Days per week: Not on file    Minutes per session: Not on file  . Stress:  Not on file  Relationships  . Social Herbalist on phone: Not on file    Gets together: Not on file    Attends religious service: Not on file    Active member of club or organization: Not on file    Attends meetings of clubs or organizations: Not on file    Relationship status: Not on file  . Intimate partner violence    Fear of current or ex partner: Not on file    Emotionally abused: Not on file    Physically abused: Not on file    Forced sexual activity: Not on file  Other Topics Concern  . Not on file  Social History Narrative   Patient is separated., no children.   Has pets   Works full time at [employer]     Outpatient Medications Prior to Visit  Medication Sig Dispense Refill  . aspirin 81  MG chewable tablet Chew 1 tablet (81 mg total) by mouth daily. 30 tablet 3  . atorvastatin (LIPITOR) 80 MG tablet TAKE 1 TABLET (80 MG TOTAL) BY MOUTH DAILY AT 6 PM. 90 tablet 1  . carvedilol (COREG) 6.25 MG tablet Take 1.5 tablets (9.375 mg total) by mouth 2 (two) times daily with a meal. (Patient taking differently: Take 6.25 mg by mouth 2 (two) times daily with a meal. ) 90 tablet 6  . ENTRESTO 24-26 MG TAKE 1 TABLET BY MOUTH TWICE A DAY 60 tablet 5  . pantoprazole (PROTONIX) 40 MG tablet Take 1 tablet (40 mg total) by mouth daily. 90 tablet 1  . spironolactone (ALDACTONE) 25 MG tablet TAKE 1 TABLET BY MOUTH DAILY. 30 tablet 2  . methocarbamol (ROBAXIN) 500 MG tablet Take 1 tablet (500 mg total) by mouth 2 (two) times daily. 20 tablet 0   No facility-administered medications prior to visit.     Allergies  Allergen Reactions  . Amoxicillin Swelling    Eye swelling  . Lasix [Furosemide] Swelling    Eye swelling    ROS Review of Systems  Constitutional: Negative for chills, fatigue and fever.  HENT: Negative for sore throat and trouble swallowing.   Eyes: Negative for photophobia and visual disturbance.  Respiratory: Negative for cough and shortness of breath.   Cardiovascular: Positive for palpitations (Occasional but not bothersome at this time). Negative for chest pain.  Gastrointestinal: Negative for abdominal pain, blood in stool, constipation, diarrhea and nausea.  Endocrine: Positive for polyuria. Negative for cold intolerance, heat intolerance, polydipsia and polyphagia.  Genitourinary: Positive for frequency. Negative for dysuria and flank pain.  Musculoskeletal: Negative for arthralgias, back pain and gait problem.  Neurological: Negative for dizziness, weakness, light-headedness and headaches.  Hematological: Negative for adenopathy. Does not bruise/bleed easily.  Psychiatric/Behavioral: Negative for self-injury and suicidal ideas. The patient is not nervous/anxious.        Objective:    Physical Exam  Constitutional: He is oriented to person, place, and time. He appears well-developed and well-nourished.  WNWD adult male in NAD wearing face mask  Neck: Normal range of motion. Neck supple. No JVD present.  Cardiovascular: Normal rate and regular rhythm.  No carotid bruit  Pulmonary/Chest: Effort normal and breath sounds normal.  Abdominal: Soft. There is no abdominal tenderness. There is no rebound and no guarding.  Musculoskeletal: Normal range of motion.        General: No tenderness or edema.     Comments: No CVA tenderness  Lymphadenopathy:    He has  no cervical adenopathy.  Neurological: He is alert and oriented to person, place, and time.  Skin: Skin is warm and dry. No rash noted.  Psychiatric: He has a normal mood and affect. His behavior is normal.  Nursing note and vitals reviewed.   BP 118/78   Pulse 81   Temp (!) 97.3 F (36.3 C) (Temporal)   Resp 17   Ht 5\' 8"  (1.727 m)   Wt 179 lb 12.8 oz (81.6 kg)   SpO2 96%   BMI 27.34 kg/m  Wt Readings from Last 3 Encounters:  11/11/18 179 lb 12.8 oz (81.6 kg)  05/13/18 179 lb (81.2 kg)  05/03/18 179 lb 9.6 oz (81.5 kg)   Preventative care reminders: *Patient was offered and agreed to have influenza immunization at today's visit -Will need referral for screening colonoscopy after he turns 48   Lab Results  Component Value Date   TSH 1.105 05/03/2018   Lab Results  Component Value Date   WBC 5.3 04/12/2018   HGB 14.4 04/12/2018   HCT 42.9 04/12/2018   MCV 87 04/12/2018   PLT 242 04/12/2018   Lab Results  Component Value Date   NA 138 05/03/2018   K 4.0 05/03/2018   CO2 23 05/03/2018   GLUCOSE 137 (H) 05/03/2018   BUN 9 05/03/2018   CREATININE 0.98 05/03/2018   BILITOT 0.9 04/12/2018   ALKPHOS 52 04/12/2018   AST 16 04/12/2018   ALT 36 04/12/2018   PROT 7.6 04/12/2018   ALBUMIN 4.9 04/12/2018   CALCIUM 9.4 05/03/2018   ANIONGAP 9 05/03/2018   Lab Results   Component Value Date   CHOL 239 (H) 07/02/2015   Lab Results  Component Value Date   HDL 43 07/02/2015   Lab Results  Component Value Date   LDLCALC 172 (H) 07/02/2015   Lab Results  Component Value Date   TRIG 122 07/02/2015   Lab Results  Component Value Date   CHOLHDL 5.6 07/02/2015   Lab Results  Component Value Date   HGBA1C 6.0 (H) 04/12/2018      Assessment & Plan:  - Needs flu shot Patient was offered and agreed to have his influenza immunization at today's visit. Educational information provided regarding immunization - Flu Vaccine QUAD 6+ mos PF IM (Fluarix Quad PF)  1. Essential hypertension BP is currently stable and well controlled on current medications including Entresto.  Coreg and spironolactone.  Continue to be compliant with medication, low-sodium diet and exercise as tolerated. - POCT URINALYSIS DIP (CLINITEK) - Lipid Panel - spironolactone (ALDACTONE) 25 MG tablet; Take 1 tablet (25 mg total) by mouth daily.  Dispense: 30 tablet; Refill: 5  2. Heart failure with reduced ejection fraction, NYHA class II ([place]) Notes from heart failure clinic reviewed.  Patient is supposed to be on Coreg/carvedilol but this is not on his medication list therefore will have CMA check with patient's pharmacy to make sure that he is still receiving this medication.  Patient appears to be stable.  Cardiologist in his note raised possibility that patient with issues with anxiety as the cause of some of his symptoms but patient with no complaint of anxiety at today's visit.  Patient was provided with refills of his Aldactone and Entresto and patient will have complete metabolic panel so that electrolytes can be checked as spironolactone and Entresto combination can cause hyperkalemia.  Patient appears to be stable at this time as he does not have any rales, no peripheral edema and no issues  with increased shortness of breath.  Patient was initially diagnosed with acute CHF in April  2017 and EF at that time was 15%.  EF done 10/22/2017 showed EF of 40 to 50%.  CMA checked with patient's pharmacy and Coreg was filled by patient's cardiologist.  Medication will be added to patient's medication list. - Comprehensive metabolic panel - sacubitril-valsartan (ENTRESTO) 24-26 MG; Take 1 tablet by mouth 2 (two) times daily.  Dispense: 60 tablet; Refill: 5 - spironolactone (ALDACTONE) 25 MG tablet; Take 1 tablet (25 mg total) by mouth daily.  Dispense: 30 tablet; Refill: 5  3. Hyperlipidemia, unspecified hyperlipidemia type Patient with history of TIA and hyperlipidemia.  Patient is currently on atorvastatin 80 mg daily.  He denies any increased muscle aches with the use of high-dose atorvastatin.  Will check CMP in follow-up of use of statin therapy.  Patient will have fasting lipid panel at today's visit in follow-up of hyperlipidemia. - Comprehensive metabolic panel - Lipid Panel - atorvastatin (LIPITOR) 80 MG tablet; Take 1 tablet (80 mg total) by mouth daily at 6 PM.  Dispense: 30 tablet; Refill: 5  4. Prediabetes Hemoglobin A1c will be done at today's visit in follow-up of prediabetes.  He will also have complete metabolic panel and urinalysis.  Patient's last hemoglobin A1c was 6.0 in January 2020.  He is not interested in starting medication such as metformin at this time.  He is encouraged to continue dietary changes as well as exercise to help lower his blood sugars. - POCT URINALYSIS DIP (CLINITEK) - Comprehensive metabolic panel - Hemoglobin A1c - metFORMIN (GLUCOPHAGE) 500 MG tablet; One daily after the evening meal  Dispense: 30 tablet; Refill: 5  5. History of TIA (transient ischemic attack) Continue secondary prevention including atorvastatin for hyperlipidemia, continued control of hypertension and continue daily aspirin therapy. - Lipid Panel - aspirin 81 MG chewable tablet; One pill by mouth daily  Dispense: 30 tablet; Refill: 11  6. Urinary frequency Will check  urinalysis to look for urinary tract infection or other abnormality which may be contributing to urinary frequency as well as checking hemoglobin A1c to see if elevated blood sugars may be contributing to urinary frequency. - POCT URINALYSIS DIP (CLINITEK) - Hemoglobin A1c  7. Encounter for long-term (current) use of medications Complete metabolic panel will be done at today's visit in follow-up of patient's long-term use of medications for treatment of hypertension, CHF and hyperlipidemia. - Comprehensive metabolic panel  8. Long-term use of aspirin therapy; 9. GERD Patient will continue the use of pantoprazole for treatment of GERD as well as for stomach protection due to long-term use of aspirin therapy.  Patient has had normal CBC earlier in the year. - pantoprazole (PROTONIX) 40 MG tablet; Take 1 tablet (40 mg total) by mouth daily.  Dispense: 30 tablet; Refill: 11   10.  PVCs (premature ventricular contractions) Patient is cardiology notes reviewed and patient has had prior issues with PVCs and sensation of palpitations but he reports that these have decreased.  An After Visit Summary was printed and given to the patient.   Follow-up: Return in about 4 months (around 03/13/2019) for chronic issues- sooner if needed.    Antony Blackbird, MD

## 2018-11-12 LAB — COMPREHENSIVE METABOLIC PANEL WITH GFR
ALT: 51 IU/L — ABNORMAL HIGH (ref 0–44)
AST: 25 IU/L (ref 0–40)
Albumin/Globulin Ratio: 1.9 (ref 1.2–2.2)
Albumin: 5 g/dL (ref 4.0–5.0)
Alkaline Phosphatase: 61 IU/L (ref 39–117)
BUN/Creatinine Ratio: 12 (ref 9–20)
BUN: 13 mg/dL (ref 6–24)
Bilirubin Total: 1.2 mg/dL (ref 0.0–1.2)
CO2: 23 mmol/L (ref 20–29)
Calcium: 9.7 mg/dL (ref 8.7–10.2)
Chloride: 102 mmol/L (ref 96–106)
Creatinine, Ser: 1.07 mg/dL (ref 0.76–1.27)
GFR calc Af Amer: 94 mL/min/1.73
GFR calc non Af Amer: 81 mL/min/1.73
Globulin, Total: 2.7 g/dL (ref 1.5–4.5)
Glucose: 118 mg/dL — ABNORMAL HIGH (ref 65–99)
Potassium: 4.4 mmol/L (ref 3.5–5.2)
Sodium: 141 mmol/L (ref 134–144)
Total Protein: 7.7 g/dL (ref 6.0–8.5)

## 2018-11-12 LAB — HEMOGLOBIN A1C
Est. average glucose Bld gHb Est-mCnc: 126 mg/dL
Hgb A1c MFr Bld: 6 % — ABNORMAL HIGH (ref 4.8–5.6)

## 2018-11-12 LAB — LIPID PANEL
Chol/HDL Ratio: 3.6 ratio (ref 0.0–5.0)
Cholesterol, Total: 146 mg/dL (ref 100–199)
HDL: 41 mg/dL
LDL Calculated: 58 mg/dL (ref 0–99)
Triglycerides: 236 mg/dL — ABNORMAL HIGH (ref 0–149)
VLDL Cholesterol Cal: 47 mg/dL — ABNORMAL HIGH (ref 5–40)

## 2018-11-26 NOTE — Progress Notes (Signed)
Patient notified of results & recommendations. Expressed understanding.

## 2018-12-01 ENCOUNTER — Other Ambulatory Visit: Payer: Self-pay

## 2018-12-01 ENCOUNTER — Encounter: Payer: Self-pay | Admitting: Emergency Medicine

## 2018-12-01 ENCOUNTER — Ambulatory Visit
Admission: EM | Admit: 2018-12-01 | Discharge: 2018-12-01 | Disposition: A | Discharge status: Home / Self Care | Payer: No Typology Code available for payment source | Attending: Emergency Medicine | Admitting: Emergency Medicine

## 2018-12-01 DIAGNOSIS — N50812 Left testicular pain: Secondary | ICD-10-CM

## 2018-12-01 LAB — POCT URINALYSIS DIP (MANUAL ENTRY)
Bilirubin, UA: NEGATIVE
Blood, UA: NEGATIVE
Glucose, UA: NEGATIVE mg/dL
Ketones, POC UA: NEGATIVE mg/dL
Leukocytes, UA: NEGATIVE
Nitrite, UA: NEGATIVE
Protein Ur, POC: NEGATIVE mg/dL
Spec Grav, UA: 1.025 (ref 1.010–1.025)
Urobilinogen, UA: 0.2 E.U./dL
pH, UA: 6 (ref 5.0–8.0)

## 2018-12-01 NOTE — ED Provider Notes (Signed)
[facility]    CSN: [id number] Arrival date & time: 12/01/18  1306      History   Chief Complaint Chief Complaint  Patient presents with  . Flank Pain    HPI Jerome Barnett is a 48 y.o. male with history of CHF, hypertension presenting for left lower back pain that started Friday.  States pain is worse when coughing, sneezing, bending.  Patient has difficulty getting in and out of his vehicle.  Denies inciting event, trauma to the area.  Patient also endorsing left testicular pain.  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  Patient does not remember what happened at that time other than experiencing severe low belly and back pain, and then being told that "my testicle disappeared ".  Patient is largely concerned because this pain reminds him of that event.  Patient denies history of testicular cancer, penile pain, swelling, discharge, urinary frequency, urgency, hematuria.  Patient denies history of pyelonephritis, renal calculi.  Patient has tried OTC analgesia without relief of pain.   Past Medical History:  Diagnosis Date  . CHF (congestive heart failure) [facility])     Patient Active Problem List   Diagnosis Date Noted  . Essential hypertension 09/09/2015  . Heart failure with reduced ejection fraction, NYHA class II ([place]) 07/20/2015  . Hyperlipidemia   . TIA (transient ischemic attack) 07/02/2015    Past Surgical History:  Procedure Laterality Date  . CARDIAC CATHETERIZATION N/A 07/05/2015   Procedure: Right/Left Heart Cath and Coronary Angiography;  Surgeon: Jolaine Artist, MD;  Location: [place] [facility];  Service: Cardiovascular;  Laterality: N/A;       Home Medications    Prior to Admission medications   Medication Sig Start Date End Date Taking? Authorizing Provider  aspirin 81 MG chewable tablet One pill by mouth daily 11/11/18   Fulp, Cammie, MD  atorvastatin (LIPITOR) 80 MG tablet Take 1 tablet  (80 mg total) by mouth daily at 6 PM. 11/11/18   Bensimhon, Shaune Pascal, MD  carvedilol (COREG) 6.25 MG tablet Take 1.5 tablets (9.375 mg total) by mouth 2 (two) times daily with a meal. 11/11/18   Bensimhon, Shaune Pascal, MD  metFORMIN (GLUCOPHAGE) 500 MG tablet One daily after the evening meal 11/11/18   Fulp, Cammie, MD  pantoprazole (PROTONIX) 40 MG tablet Take 1 tablet (40 mg total) by mouth daily. 11/11/18   Fulp, Cammie, MD  sacubitril-valsartan (ENTRESTO) 24-26 MG Take 1 tablet by mouth 2 (two) times daily. 11/11/18   Bensimhon, Shaune Pascal, MD  spironolactone (ALDACTONE) 25 MG tablet Take 1 tablet (25 mg total) by mouth daily. 11/11/18   Bensimhon, Shaune Pascal, MD    Family History Family History  Problem Relation Age of Onset  . Hyperlipidemia Mother   . Hypertension Father   . Hyperlipidemia Father   . Irritable bowel syndrome Father     Social History Social History   Tobacco Use  . Smoking status: Former Smoker    Packs/day: 2.00    Years: 20.00    Pack years: 40.00    Quit date: 03/20/2012    Years since quitting: 6.7  . Smokeless tobacco: Never Used  Substance Use Topics  . Alcohol use: No    Alcohol/week: 0.0 standard drinks  . Drug use: No     Allergies   Amoxicillin and Lasix [furosemide]   Review of Systems Review of Systems  Constitutional: Negative for fatigue and fever.  Respiratory: Negative for cough  and shortness of breath.   Cardiovascular: Negative for chest pain and palpitations.  Gastrointestinal: Positive for abdominal pain. Negative for abdominal distention, blood in stool, constipation, diarrhea, rectal pain and vomiting.  Genitourinary: Positive for testicular pain. Negative for discharge, dysuria, frequency, hematuria, penile pain, penile swelling, scrotal swelling and urgency.  Musculoskeletal: Negative for arthralgias and myalgias.  Skin: Negative for rash and wound.  Neurological: Negative for speech difficulty and headaches.  All other systems  reviewed and are negative.    Physical Exam Triage Vital Signs ED Triage Vitals  Enc Vitals Group     BP      Pulse      Resp      Temp      Temp src      SpO2      Weight      Height      Head Circumference      Peak Flow      Pain Score      Pain Loc      Pain Edu?      Excl. in [place]?    No data found.  Updated Vital Signs BP 128/90 (BP Location: Right Arm)   Pulse 81   Temp 98 F (36.7 C) (Oral)   Resp 18   SpO2 97%   Visual Acuity Right Eye Distance:   Left Eye Distance:   Bilateral Distance:    Right Eye Near:   Left Eye Near:    Bilateral Near:     Physical Exam Constitutional:      General: He is not in acute distress. HENT:     Head: Normocephalic and atraumatic.  Eyes:     General: No scleral icterus.    Pupils: Pupils are equal, round, and reactive to light.  Cardiovascular:     Rate and Rhythm: Normal rate.  Pulmonary:     Effort: Pulmonary effort is normal.  Abdominal:     General: Bowel sounds are normal. There is no distension.     Tenderness: There is abdominal tenderness. There is no right CVA tenderness, left CVA tenderness, guarding or rebound.     Comments: Left lower quadrant tenderness  Genitourinary:    Penis: Normal.      Comments: Patient circumcised without evidence of balanitis, penile discharge, meatal irritation.  Right testicle absent, last testicle intact, mildly tender to palpation.  Focal soft tissue swelling superior to testicle: Difficult to appreciate if this is adipose versus epididymitis versus torsion, though area is tender.  Unable to appreciate inguinal canal defect, though area is very tender to palpation..  No prolapsing tissue appreciated with Valsalva/cough.  No inguinal lymphadenopathy bilaterally.  No inguinal tenderness on right Musculoskeletal:     Comments: Obvious deformity, ecchymosis, edema of lumbar spine.  No spinous process tenderness.  No paraspinal, PSIS tenderness to palpation on left side.  Skin:     Coloration: Skin is not jaundiced or pale.  Neurological:     Mental Status: He is alert and oriented to person, place, and time.      UC Treatments / Results  Labs (all labs ordered are listed, but only abnormal results are displayed) Labs Reviewed  POCT URINALYSIS DIP (MANUAL ENTRY) - Normal    EKG   Radiology No results found.  Procedures Procedures (including critical care time)  Medications Ordered in UC Medications - No data to display  Initial Impression / Assessment and Plan / UC Course  I have reviewed the triage vital signs  and the nursing notes.  Pertinent labs & imaging results that were available during my care of the patient were reviewed by me and considered in my medical decision making (see chart for details).     1.  Left testicular pain Patient afebrile, toxic in office, though obviously in pain.  Unable to appreciate adventitious mass, though tenderness during GU exam concerning for torsion versus strangulated hernia versus epididymitis.  Patient referred to ER for further evaluation.  Return precautions discussed, patient verbalized understanding and is agreeable to plan. Final Clinical Impressions(s) / UC Diagnoses   Final diagnoses:  Left testicular pain   Discharge Instructions   None    ED Prescriptions    None     Controlled Substance Prescriptions [place] Controlled Substance Registry consulted? Not Applicable   Quincy Sheehan, [place] 12/01/18 1419

## 2018-12-01 NOTE — ED Triage Notes (Signed)
Per pt he started having left lower flank pain on Friday. Pt said it radiates from his left lower back to his left lower quadrant. Pt said hard to walk or bend,. Some nausea. Stabbing and shooting No painful urination no blood in his urine

## 2018-12-11 MED FILL — metFORMIN HCL 500 MG TABS: 500 | 30 days supply | Qty: 30 | Fill #1

## 2018-12-11 MED FILL — CARVEDILOL 6.25 MG TABLET: 6.25 | 30 days supply | Qty: 90 | Fill #1

## 2018-12-11 MED FILL — PANTOPRAZOLE SOD DR 40 MG T: 40 | 30 days supply | Qty: 30 | Fill #1

## 2018-12-11 MED FILL — SPIRONOLACTONE 25 MG TABS: 25 | 30 days supply | Qty: 30 | Fill #1

## 2018-12-11 MED FILL — ATORVASTATIN 80 MG TABLET: 80 | 30 days supply | Qty: 30 | Fill #1

## 2019-01-06 ENCOUNTER — Telehealth (HOSPITAL_COMMUNITY): Payer: Self-pay | Admitting: *Deleted

## 2019-01-06 NOTE — Telephone Encounter (Signed)
Pt called to state he has been out of Entresto for about a month and has not been able to get it from the pharmacy, they told him it needed approval from insurance company.  Per Nani Ravens tech, pt changed insurance companies and it does require a new PA, she will complete this.  I have left samples at front desk for pt to p/u to get him back on med in the mean time.  Medication Samples have been provided to the patient.  Drug name: [name]       Strength: 24/26mg         Qty: 1  LOTIC:[id number]  Exp.Date: 3/22  Dosing instructions: Take 1 tab Twice daily   The patient has been instructed regarding the correct time, dose, and frequency of taking this medication, including desired effects and most common side effects.   Jerome Barnett 2:33 PM 01/06/2019

## 2019-01-07 ENCOUNTER — Telehealth (HOSPITAL_COMMUNITY): Payer: Self-pay | Admitting: Pharmacy Technician

## 2019-01-07 NOTE — Telephone Encounter (Signed)
Patient has new insurance that required a new prior authorization of Entresto.  Advanced Heart Failure Patient Advocate Encounter  Prior Authorization for [name] 24-26mg  has been approved.    PA# [id number] Effective dates: 01/07/2019 through 01/07/2020  Patients co-pay is $250  Used co-pay card to bring co-pay down to $10  [place]

## 2019-01-14 MED FILL — ENTRESTO 24 MG-26 MG TABLET: 24-26 | 30 days supply | Qty: 60 | Fill #0

## 2019-01-16 MED FILL — SPIRONOLACTONE 25 MG TABS: 25 | 30 days supply | Qty: 30 | Fill #2

## 2019-01-16 MED FILL — ATORVASTATIN 80 MG TABLET: 80 | 30 days supply | Qty: 30 | Fill #2

## 2019-01-16 MED FILL — PANTOPRAZOLE SOD DR 40 MG T: 40 | 30 days supply | Qty: 30 | Fill #2

## 2019-01-16 MED FILL — metFORMIN HCL 500 MG TABS: 500 | 30 days supply | Qty: 30 | Fill #2

## 2019-02-24 MED FILL — ATORVASTATIN 80 MG TABLET: 80 | 30 days supply | Qty: 30 | Fill #3

## 2019-02-24 MED FILL — metFORMIN HCL 500 MG TABS: 500 | 30 days supply | Qty: 30 | Fill #3

## 2019-02-24 MED FILL — PANTOPRAZOLE SOD DR 40 MG T: 40 | 30 days supply | Qty: 30 | Fill #3

## 2019-02-24 MED FILL — SPIRONOLACTONE 25 MG TABS: 25 | 30 days supply | Qty: 30 | Fill #3

## 2019-02-24 MED FILL — CARVEDILOL 6.25 MG TABLET: 6.25 | 30 days supply | Qty: 90 | Fill #2

## 2019-03-17 ENCOUNTER — Ambulatory Visit: Payer: No Typology Code available for payment source

## 2019-03-27 MED FILL — metFORMIN HCL 500 MG TABS: 500 | 30 days supply | Qty: 30 | Fill #4

## 2019-03-27 MED FILL — PANTOPRAZOLE SOD DR 40 MG T: 40 | 30 days supply | Qty: 30 | Fill #4

## 2019-03-27 MED FILL — ENTRESTO 24 MG-26 MG TABLET: 24-26 | 30 days supply | Qty: 60 | Fill #2

## 2019-03-27 MED FILL — ATORVASTATIN 80 MG TABLET: 80 | 30 days supply | Qty: 30 | Fill #4

## 2019-03-27 MED FILL — SPIRONOLACTONE 25 MG TABS: 25 | 30 days supply | Qty: 30 | Fill #4

## 2019-03-31 ENCOUNTER — Ambulatory Visit (INDEPENDENT_AMBULATORY_CARE_PROVIDER_SITE_OTHER): Payer: No Typology Code available for payment source | Admitting: Family Medicine

## 2019-03-31 DIAGNOSIS — I5020 Unspecified systolic (congestive) heart failure: Secondary | ICD-10-CM | POA: Diagnosis not present

## 2019-03-31 DIAGNOSIS — E785 Hyperlipidemia, unspecified: Secondary | ICD-10-CM

## 2019-03-31 DIAGNOSIS — R509 Fever, unspecified: Secondary | ICD-10-CM | POA: Diagnosis not present

## 2019-03-31 DIAGNOSIS — R7303 Prediabetes: Secondary | ICD-10-CM | POA: Diagnosis not present

## 2019-03-31 DIAGNOSIS — I10 Essential (primary) hypertension: Secondary | ICD-10-CM

## 2019-03-31 NOTE — Progress Notes (Signed)
Virtual Visit via Telephone Note  I connected with Jerome Barnett on 03/31/19 at  9:10 AM EST by telephone and verified that I am speaking with the correct person using two identifiers.   I discussed the limitations, risks, security and privacy concerns of performing an evaluation and management service by telephone and the availability of in person appointments. I also discussed with the patient that there may be a patient responsible charge related to this service. The patient expressed understanding and agreed to proceed.  Patient Location: Home Provider Location: [facility] Others participating in call: call initiated by Allyne Gee, CMA who then transferred the call to me   History of Present Illness:          49 year old male seen in follow-up of chronic medical issues including hypertension, heart failure, hyperlipidemia, prediabetes and patient with complaint of onset of sensation of fever with nighttime sweating, body aches, headache and abnormal sensation of taste since Friday.  He reports that he did go to work today but was sent home secondary to his fever.  He has not been tested for COVID-19.  He does have a mild cough which is nonproductive.  He reports that he will feel as if he has a tickling sensation in his throat which then causes him to cough.  He has had issues with allergic rhinitis and did run out of his over-the-counter allergy medication a few weeks ago and wonders if this is contributing to his symptoms.          He has been compliant with his medications.  He denies any headaches or dizziness related to his blood pressure.  He has a home blood pressure cuff but has not been monitoring his blood pressure.  He believes that his blood pressure remains controlled.  He denies any shortness of breath, peripheral edema or increased fatigue related to heart failure.  He believes that his heart failure symptoms are stable.  He has had some increased thirst since onset of  fever but otherwise denies increased thirst, urinary frequency or blurred vision related to his blood sugars.  He is taking Metformin without difficulty.  He does not monitor his home blood sugars.  He has not really followed a low carbohydrate/diabetic diet.  He continues to take atorvastatin for hyperlipidemia and does not believe that this is contributed to any increase in muscle or joint pain.   Past Medical History:  Diagnosis Date  . CHF (congestive heart failure) ([place])     Past Surgical History:  Procedure Laterality Date  . CARDIAC CATHETERIZATION N/A 07/05/2015   Procedure: Right/Left Heart Cath and Coronary Angiography;  Surgeon: Jolaine Artist, MD;  Location: [facility];  Service: Cardiovascular;  Laterality: N/A;    Family History  Problem Relation Age of Onset  . Hyperlipidemia Mother   . Hypertension Father   . Hyperlipidemia Father   . Irritable bowel syndrome Father     Social History   Tobacco Use  . Smoking status: Former Smoker    Packs/day: 2.00    Years: 20.00    Pack years: 40.00    Quit date: 03/20/2012    Years since quitting: 7.0  . Smokeless tobacco: Never Used  Substance Use Topics  . Alcohol use: No    Alcohol/week: 0.0 standard drinks  . Drug use: No     Allergies  Allergen Reactions  . Amoxicillin Swelling    Eye swelling  . Lasix [Furosemide] Swelling    Eye swelling  Observations/Objective: No vital signs or physical exam conducted as visit was done via telephone  Assessment and Plan: 1. Essential hypertension 2. Heart failure with reduced ejection fraction, NYHA class II ([place]) He reports that his blood pressure has been stable and while he has not checked his blood pressure he feels that it is controlled on his current medications.  He is encouraged to periodically monitor his blood pressure and blood pressure goal is 130/80 or less.  He is to continue the use of Coreg, spironolactone and Entresto for treatment of  hypertension and heart failure.  He feels that his heart failure is currently stable.  He will continue cardiology follow-up.  3. Prediabetes Discussed with patient that his most recent hemoglobin A1c in August was 6.0 and discussed that 6.5 or higher is consistent with a diagnosis of diabetes.  Discussed symptoms of hypoglycemia/diabetes including increased thirst, frequent urination and blurred vision.  He is encouraged to remain well-hydrated, follow a low carbohydrate diet and when he is feeling better to engage in regular low impact exercise such as walking.  He has been asked to come into the office in 4 to 6 weeks for hemoglobin A1c in follow-up of prediabetes  4. Fever, unspecified fever cause He reports sensation of having a fever since Friday along with onset of body aches, headache, sensation of abnormal taste and nonproductive cough.  Discussed with the patient that there is a concern that he may have COVID-19.  CMA will contact patient with additional information for patient to have testing for COVID-19.  He is asked to obtain a thermometer for monitoring of temperature/fever.  He is advised to rest, remain well-hydrated, take Tylenol as needed for headache or body aches and to continue the use of daily aspirin.  If he develops shortness of breath, difficulty breathing, chest pain or any other concerns he should go to the emergency department for further evaluation.  He will receive a work note to be out of work this week and this will be extended if he is positive for COVID-19.  He was made aware that he should self isolate and if positive for COVID-19 he will need to continue self-isolation until 10 days after his last fever.  5. Dyslipidemia: 6.  Long-term use of medication Continue use of atorvastatin and low-fat diet along with regular cardiovascular exercise such as walking.  He is to return to clinic for lab visit in 4 to 5 weeks after he recovers from current illness for comprehensive  metabolic panel in follow-up of long-term use of statin medication as well as medication for treatment of CHF.  Follow Up Instructions:Return in about 4 months (around 07/29/2019) for Chronic issues-sooner if needed; labs in 4 to 6 weeks.    I discussed the assessment and treatment plan with the patient. The patient was provided an opportunity to ask questions and all were answered. The patient agreed with the plan and demonstrated an understanding of the instructions.   The patient was advised to call back or seek an in-person evaluation if the symptoms worsen or if the condition fails to improve as anticipated.  I provided 12 minutes of non-face-to-face time during this encounter.  An additional 10 minutes was spent reviewing patient's chart, placing orders and completion of note.  Antony Blackbird, MD

## 2019-04-01 ENCOUNTER — Ambulatory Visit: Payer: No Typology Code available for payment source | Attending: Internal Medicine

## 2019-04-01 DIAGNOSIS — Z20822 Contact with and (suspected) exposure to covid-19: Secondary | ICD-10-CM

## 2019-04-03 LAB — NOVEL CORONAVIRUS, NAA: SARS-CoV-2, NAA: NOT DETECTED

## 2019-05-01 MED FILL — SPIRONOLACTONE 25 MG TABS: 25 | 30 days supply | Qty: 30 | Fill #5

## 2019-05-01 MED FILL — ENTRESTO 24 MG-26 MG TABLET: 24-26 | 30 days supply | Qty: 60 | Fill #3

## 2019-05-01 MED FILL — ATORVASTATIN 80 MG TABLET: 80 | 30 days supply | Qty: 30 | Fill #5

## 2019-05-01 MED FILL — PANTOPRAZOLE SOD DR 40 MG T: 40 | 30 days supply | Qty: 30 | Fill #5

## 2019-05-01 MED FILL — metFORMIN HCL 500 MG TABS: 500 | 30 days supply | Qty: 30 | Fill #5

## 2019-05-01 MED FILL — CARVEDILOL 6.25 MG TABLET: 6.25 | 30 days supply | Qty: 90 | Fill #3

## 2019-06-06 ENCOUNTER — Other Ambulatory Visit: Payer: Self-pay | Admitting: Family Medicine

## 2019-06-06 DIAGNOSIS — R7303 Prediabetes: Secondary | ICD-10-CM

## 2019-06-06 MED FILL — ATORVASTATIN 80 MG TABLET: 80 | 30 days supply | Qty: 30 | Fill #0

## 2019-06-06 MED FILL — ENTRESTO 24 MG-26 MG TABLET: 24-26 | 30 days supply | Qty: 60 | Fill #4

## 2019-06-06 MED FILL — PANTOPRAZOLE SOD DR 40 MG T: 40 | 30 days supply | Qty: 30 | Fill #6

## 2019-06-06 MED FILL — SPIRONOLACTONE 25 MG TABS: 25 | 30 days supply | Qty: 30 | Fill #0

## 2019-06-06 MED FILL — METFORMIN HCL 500 MG TABS: 500 | 30 days supply | Qty: 30 | Fill #0

## 2019-06-06 MED FILL — CARVEDILOL 6.25 MG TABLET: 6.25 | 30 days supply | Qty: 90 | Fill #4

## 2019-07-11 MED FILL — ENTRESTO 24 MG-26 MG TABLET: 24-26 | 30 days supply | Qty: 60 | Fill #5

## 2019-07-11 MED FILL — ATORVASTATIN 80 MG TABLET: 80 | 30 days supply | Qty: 30 | Fill #1

## 2019-07-11 MED FILL — PANTOPRAZOLE SOD DR 40 MG T: 40 | 30 days supply | Qty: 30 | Fill #7

## 2019-07-11 MED FILL — SPIRONOLACTONE 25 MG TABS: 25 | 30 days supply | Qty: 30 | Fill #1

## 2019-08-12 MED FILL — CARVEDILOL 6.25 MG TABLET: 6.25 | 30 days supply | Qty: 90 | Fill #5

## 2019-08-12 MED FILL — ATORVASTATIN 80 MG TABLET: 80 | 30 days supply | Qty: 30 | Fill #2

## 2019-08-12 MED FILL — SPIRONOLACTONE 25 MG TABS: 25 | 30 days supply | Qty: 30 | Fill #2

## 2019-08-12 MED FILL — ENTRESTO 24 MG-26 MG TABLET: 24-26 | 30 days supply | Qty: 60 | Fill #0

## 2019-08-12 MED FILL — PANTOPRAZOLE SOD DR 40 MG T: 40 | 30 days supply | Qty: 30 | Fill #8

## 2019-09-16 MED FILL — ENTRESTO 24 MG-26 MG TABLET: 24-26 | 30 days supply | Qty: 60 | Fill #1

## 2019-09-16 MED FILL — CARVEDILOL 6.25 MG TABLET: 6.25 | 30 days supply | Qty: 90 | Fill #6

## 2019-09-16 MED FILL — PANTOPRAZOLE SOD DR 40 MG T: 40 | 30 days supply | Qty: 30 | Fill #9

## 2019-09-16 MED FILL — SPIRONOLACTONE 25 MG TABS: 25 | 30 days supply | Qty: 30 | Fill #3

## 2019-09-16 MED FILL — ATORVASTATIN 80 MG TABLET: 80 | 30 days supply | Qty: 30 | Fill #3

## 2019-10-20 MED FILL — PANTOPRAZOLE SOD DR 40 MG T: 40 | 30 days supply | Qty: 30 | Fill #10

## 2019-10-20 MED FILL — SPIRONOLACTONE 25 MG TABS: 25 | 30 days supply | Qty: 30 | Fill #4

## 2019-10-20 MED FILL — ATORVASTATIN 80 MG TABLET: 80 | 30 days supply | Qty: 30 | Fill #4

## 2019-11-20 ENCOUNTER — Other Ambulatory Visit: Payer: Self-pay | Admitting: Internal Medicine

## 2019-11-20 ENCOUNTER — Other Ambulatory Visit: Payer: Self-pay | Admitting: Family Medicine

## 2019-11-20 DIAGNOSIS — E785 Hyperlipidemia, unspecified: Secondary | ICD-10-CM

## 2019-11-20 DIAGNOSIS — I5020 Unspecified systolic (congestive) heart failure: Secondary | ICD-10-CM

## 2019-11-20 DIAGNOSIS — K219 Gastro-esophageal reflux disease without esophagitis: Secondary | ICD-10-CM

## 2019-11-20 DIAGNOSIS — I10 Essential (primary) hypertension: Secondary | ICD-10-CM

## 2019-11-20 DIAGNOSIS — Z7982 Long term (current) use of aspirin: Secondary | ICD-10-CM

## 2019-11-20 MED FILL — PANTOPRAZOLE SOD DR 40 MG T: 40 | 30 days supply | Qty: 30 | Fill #0

## 2019-11-21 MED FILL — ENTRESTO 24 MG-26 MG TABLET: 24-26 | 30 days supply | Qty: 60 | Fill #0

## 2019-11-21 MED FILL — CARVEDILOL 6.25 MG TABLET: 6.25 | 30 days supply | Qty: 90 | Fill #0

## 2019-11-21 MED FILL — ATORVASTATIN 80 MG TABLET: 80 | 30 days supply | Qty: 30 | Fill #0

## 2019-11-21 MED FILL — SPIRONOLACTONE 25 MG TABS: 25 | 30 days supply | Qty: 30 | Fill #0

## 2019-12-22 ENCOUNTER — Other Ambulatory Visit: Payer: Self-pay | Admitting: Family Medicine

## 2019-12-22 DIAGNOSIS — K219 Gastro-esophageal reflux disease without esophagitis: Secondary | ICD-10-CM

## 2019-12-22 DIAGNOSIS — Z7982 Long term (current) use of aspirin: Secondary | ICD-10-CM

## 2019-12-22 MED FILL — ENTRESTO 24 MG-26 MG TABLET: 24-26 | 30 days supply | Qty: 60 | Fill #1

## 2019-12-22 NOTE — Telephone Encounter (Signed)
[facility] does RF for this provider

## 2019-12-26 ENCOUNTER — Other Ambulatory Visit: Payer: Self-pay | Admitting: Family Medicine

## 2019-12-26 DIAGNOSIS — K219 Gastro-esophageal reflux disease without esophagitis: Secondary | ICD-10-CM

## 2019-12-26 DIAGNOSIS — Z7982 Long term (current) use of aspirin: Secondary | ICD-10-CM

## 2019-12-26 MED FILL — CARVEDILOL 6.25 MG TABLET: 6.25 | 30 days supply | Qty: 90 | Fill #1

## 2020-01-02 ENCOUNTER — Telehealth: Payer: Self-pay | Admitting: Family Medicine

## 2020-01-02 DIAGNOSIS — Z7982 Long term (current) use of aspirin: Secondary | ICD-10-CM

## 2020-01-02 DIAGNOSIS — K219 Gastro-esophageal reflux disease without esophagitis: Secondary | ICD-10-CM

## 2020-01-02 MED ORDER — PANTOPRAZOLE SODIUM 40 MG PO TBEC: 40.0000 mg | DELAYED_RELEASE_TABLET | Freq: Every day | ORAL | 0 refills | Status: DC

## 2020-01-02 NOTE — Telephone Encounter (Signed)
1) Medication(s) Requested (by name):pantoprazole (PROTONIX) 40 MG tablet [[id number]]    2) [facility] [place] [facility] [address]  [place], [place] [address]  Phone:  [phone] Fax  3) Special Requests:   Approved medications will be sent to the pharmacy, we will reach out if there is an issue.  Requests made after 3pm may not be addressed until the following business day!  If a patient is unsure of the name of the medication(s) please note and ask patient to call back when they are able to provide all info, do not send to responsible party until all information is available!

## 2020-01-15 ENCOUNTER — Other Ambulatory Visit: Payer: Self-pay

## 2020-01-15 ENCOUNTER — Other Ambulatory Visit: Payer: Self-pay | Admitting: Physician Assistant

## 2020-01-15 ENCOUNTER — Ambulatory Visit (INDEPENDENT_AMBULATORY_CARE_PROVIDER_SITE_OTHER): Payer: No Typology Code available for payment source | Admitting: Physician Assistant

## 2020-01-15 VITALS — BP 123/78 | HR 79 | Temp 97.3°F | Resp 17 | Wt 177.0 lb

## 2020-01-15 DIAGNOSIS — I5020 Unspecified systolic (congestive) heart failure: Secondary | ICD-10-CM

## 2020-01-15 DIAGNOSIS — Z7982 Long term (current) use of aspirin: Secondary | ICD-10-CM

## 2020-01-15 DIAGNOSIS — E785 Hyperlipidemia, unspecified: Secondary | ICD-10-CM

## 2020-01-15 DIAGNOSIS — Z23 Encounter for immunization: Secondary | ICD-10-CM

## 2020-01-15 DIAGNOSIS — K219 Gastro-esophageal reflux disease without esophagitis: Secondary | ICD-10-CM

## 2020-01-15 DIAGNOSIS — I10 Essential (primary) hypertension: Secondary | ICD-10-CM

## 2020-01-15 DIAGNOSIS — R7303 Prediabetes: Secondary | ICD-10-CM

## 2020-01-15 DIAGNOSIS — Z125 Encounter for screening for malignant neoplasm of prostate: Secondary | ICD-10-CM

## 2020-01-15 MED ORDER — ATORVASTATIN CALCIUM 80 MG PO TABS: 80.0000 mg | ORAL_TABLET | Freq: Every day | ORAL | 2 refills | Status: DC

## 2020-01-15 MED ORDER — PANTOPRAZOLE SODIUM 40 MG PO TBEC: 40.0000 mg | DELAYED_RELEASE_TABLET | Freq: Every day | ORAL | 5 refills | Status: DC

## 2020-01-15 MED ORDER — METFORMIN HCL 500 MG PO TABS: ORAL_TABLET | ORAL | 2 refills | Status: DC

## 2020-01-15 MED ORDER — SPIRONOLACTONE 25 MG PO TABS: 25.0000 mg | ORAL_TABLET | Freq: Every day | ORAL | 2 refills | Status: DC

## 2020-01-15 MED ORDER — CARVEDILOL 6.25 MG PO TABS: 9.3750 mg | ORAL_TABLET | Freq: Two times a day (BID) | ORAL | 2 refills | Status: DC

## 2020-01-15 MED FILL — SPIRONOLACTONE 25 MG TABS: 25 | 30 days supply | Qty: 30 | Fill #0

## 2020-01-15 MED FILL — METFORMIN HCL 500 MG TABS: 500 | 30 days supply | Qty: 30 | Fill #0

## 2020-01-15 MED FILL — ATORVASTATIN 80 MG TABLET: 80 | 30 days supply | Qty: 30 | Fill #0

## 2020-01-15 MED FILL — PANTOPRAZOLE SOD DR 40 MG T: 40 | 30 days supply | Qty: 30 | Fill #0

## 2020-01-15 NOTE — Patient Instructions (Signed)
Make appt with cardiology

## 2020-01-15 NOTE — Progress Notes (Signed)
Jerome Barnett, is a 49 y.o. male  IPJ:[id number]  BHA:[id number]  DOB - May 03, 1970  Subjective:  Chief Complaint and HPI: Jerome Barnett is a 49 y.o. male here today for med RF.  He has not had bloodwork in a long time.  He has not seen cardiology in > 1 year.  He took the metformin for about 1 month when it was prescribed but not at all since.  He is compliant with his other medications.  He denies any vision changes/polydipsia/polyuria.  No cp/sob.  He has had 2 covid vaccines in march and April of 2021.  He would like to get his flu shot today  ROS:   Constitutional:  No f/c, No night sweats, No unexplained weight loss. EENT:  No vision changes, No blurry vision, No hearing changes. No mouth, throat, or ear problems.  Respiratory: No cough, No SOB Cardiac: No CP, no palpitations GI:  No abd pain, No N/V/D. GU: No Urinary s/sx Musculoskeletal: No joint pain Neuro: no motor weakness.  Skin: No rash Endocrine:  No polydipsia. No polyuria.  Psych: Denies SI/HI  No problems updated.  ALLERGIES: Allergies  Allergen Reactions  . Amoxicillin Swelling    Eye swelling  . Lasix [Furosemide] Swelling    Eye swelling    PAST MEDICAL HISTORY: Past Medical History:  Diagnosis Date  . CHF (congestive heart failure) ([place])     MEDICATIONS AT HOME: Prior to Admission medications   Medication Sig Start Date End Date Taking? Authorizing Provider  aspirin 81 MG chewable tablet One pill by mouth daily 11/11/18  Yes Fulp, Cammie, MD  atorvastatin (LIPITOR) 80 MG tablet Take 1 tablet (80 mg total) by mouth daily. Needs appt for further refills 01/15/20  Yes Freeman Caldron M, PA-C  carvedilol (COREG) 6.25 MG tablet Take 1.5 tablets (9.375 mg total) by mouth 2 (two) times daily with a meal. Needs appt for further refills 01/15/20  Yes Davianna Deutschman M, PA-C  sacubitril-valsartan (ENTRESTO) 24-26 MG Take 1 tablet by mouth 2 (two) times daily. Needs appt for further refills 11/20/19   Yes Bensimhon, Shaune Pascal, MD  spironolactone (ALDACTONE) 25 MG tablet Take 1 tablet (25 mg total) by mouth daily. Needs appt for further refills 01/15/20  Yes Freeman Caldron M, PA-C  metFORMIN (GLUCOPHAGE) 500 MG tablet TAKE 1 TABLET BY MOUTH ONCE A DAY AFTER THE EVENING MEAL 01/15/20   Freeman Caldron M, PA-C  pantoprazole (PROTONIX) 40 MG tablet Take 1 tablet (40 mg total) by mouth daily. 01/15/20   Argentina Donovan, PA-C     Objective:  EXAM:   Vitals:   01/15/20 1534  BP: 123/78  Pulse: 79  Resp: 17  Temp: (!) 97.3 F (36.3 C)  TempSrc: Temporal  SpO2: 97%  Weight: 177 lb (80.3 kg)    General appearance : A&OX3. NAD. Non-toxic-appearing HEENT: Atraumatic and Normocephalic.  PERRLA. EOM intact.  Neck: supple, no JVD. No cervical lymphadenopathy. No thyromegaly Chest/Lungs:  Breathing-non-labored, Good air entry bilaterally, breath sounds normal without rales, rhonchi, or wheezing  CVS: S1 S2 regular, no murmurs, gallops, rubs  Extremities: Bilateral Lower Ext shows no edema, both legs are warm to touch with = pulse throughout Neurology:  CN II-XII grossly intact, Non focal.   Psych:  TP linear. J/I WNL. Normal speech. Appropriate eye contact and affect.  Skin:  No Rash  Data Review Lab Results  Component Value Date   HGBA1C 6.0 (H) 11/11/2018   HGBA1C 6.0 (H) 04/12/2018   HGBA1C  5.6 10/22/2017     Assessment & Plan   1. Prediabetes I would presume he stuill needs to be taking this-we will see when labs are back - Hemoglobin A1c - metFORMIN (GLUCOPHAGE) 500 MG tablet; TAKE 1 TABLET BY MOUTH ONCE A DAY AFTER THE EVENING MEAL  Dispense: 30 tablet; Refill: 2  2. Gastroesophageal reflux disease without esophagitis - pantoprazole (PROTONIX) 40 MG tablet; Take 1 tablet (40 mg total) by mouth daily.  Dispense: 30 tablet; Refill: 5 - CBC with Differential/Platelet  3. Long-term use of aspirin therapy - pantoprazole (PROTONIX) 40 MG tablet; Take 1 tablet (40 mg total) by  mouth daily.  Dispense: 30 tablet; Refill: 5 - CBC with Differential/Platelet  4. Needs flu shot - Flu Vaccine QUAD 6+ mos PF IM (Fluarix Quad PF)  5. Hyperlipidemia, unspecified hyperlipidemia type - Comprehensive metabolic panel - Lipid panel - atorvastatin (LIPITOR) 80 MG tablet; Take 1 tablet (80 mg total) by mouth daily. Needs appt for further refills  Dispense: 30 tablet; Refill: 2  6. Essential hypertension controlled - Comprehensive metabolic panel - spironolactone (ALDACTONE) 25 MG tablet; Take 1 tablet (25 mg total) by mouth daily. Needs appt for further refills  Dispense: 30 tablet; Refill: 2  7. Heart failure with reduced ejection fraction, NYHA class II ([place]) Make appt with cardiology bc he has not seen them in >1 year.  I will RF meds so he will not be out.  He verbalizes understanding.   - carvedilol (COREG) 6.25 MG tablet; Take 1.5 tablets (9.375 mg total) by mouth 2 (two) times daily with a meal. Needs appt for further refills  Dispense: 90 tablet; Refill: 2 - spironolactone (ALDACTONE) 25 MG tablet; Take 1 tablet (25 mg total) by mouth daily. Needs appt for further refills  Dispense: 30 tablet; Refill: 2  8. Screening for prostate cancer - PSA   Patient have been counseled extensively about nutrition and exercise  Return in about 4 months (around 05/17/2020) for Dr Juleen China for CPE .  The patient was given clear instructions to go to ER or return to medical center if symptoms don't improve, worsen or new problems develop. The patient verbalized understanding. The patient was told to call to get lab results if they haven't heard anything in the next week.     Freeman Caldron, PA-C [facility] [place], [place]   01/15/2020, 3:51 PMPatient ID: Jerome Barnett, male   DOB: 1970/06/06, 49 y.o.   MRN: [id number]

## 2020-01-17 LAB — HEMOGLOBIN A1C
Est. average glucose Bld gHb Est-mCnc: 126 mg/dL
Hgb A1c MFr Bld: 6 % — ABNORMAL HIGH (ref 4.8–5.6)

## 2020-01-17 LAB — CBC WITH DIFFERENTIAL/PLATELET
Basophils Absolute: 0 10*3/uL (ref 0.0–0.2)
Basos: 1 %
EOS (ABSOLUTE): 0.2 10*3/uL (ref 0.0–0.4)
Eos: 4 %
Hematocrit: 45.3 % (ref 37.5–51.0)
Hemoglobin: 15 g/dL (ref 13.0–17.7)
Immature Grans (Abs): 0 10*3/uL (ref 0.0–0.1)
Immature Granulocytes: 0 %
Lymphocytes Absolute: 1.6 10*3/uL (ref 0.7–3.1)
Lymphs: 25 %
MCH: 30.1 pg (ref 26.6–33.0)
MCHC: 33.1 g/dL (ref 31.5–35.7)
MCV: 91 fL (ref 79–97)
Monocytes Absolute: 0.5 10*3/uL (ref 0.1–0.9)
Monocytes: 9 %
Neutrophils Absolute: 3.9 10*3/uL (ref 1.4–7.0)
Neutrophils: 61 %
Platelets: 226 10*3/uL (ref 150–450)
RBC: 4.98 x10E6/uL (ref 4.14–5.80)
RDW: 12.4 % (ref 11.6–15.4)
WBC: 6.3 10*3/uL (ref 3.4–10.8)

## 2020-01-17 LAB — COMPREHENSIVE METABOLIC PANEL
ALT: 80 IU/L — ABNORMAL HIGH (ref 0–44)
AST: 31 IU/L (ref 0–40)
Albumin/Globulin Ratio: 1.9 (ref 1.2–2.2)
Albumin: 5.1 g/dL — ABNORMAL HIGH (ref 3.8–4.9)
Alkaline Phosphatase: 63 IU/L (ref 44–121)
BUN/Creatinine Ratio: 13 (ref 9–20)
BUN: 14 mg/dL (ref 6–24)
Bilirubin Total: 0.7 mg/dL (ref 0.0–1.2)
CO2: 25 mmol/L (ref 20–29)
Calcium: 9.7 mg/dL (ref 8.7–10.2)
Chloride: 100 mmol/L (ref 96–106)
Creatinine, Ser: 1.1 mg/dL (ref 0.76–1.27)
GFR calc Af Amer: 89 mL/min/{1.73_m2} (ref >59)
GFR calc non Af Amer: 77 mL/min/{1.73_m2} (ref >59)
Globulin, Total: 2.7 g/dL (ref 1.5–4.5)
Glucose: 94 mg/dL (ref 65–99)
Potassium: 4.4 mmol/L (ref 3.5–5.2)
Sodium: 138 mmol/L (ref 134–144)
Total Protein: 7.8 g/dL (ref 6.0–8.5)

## 2020-01-17 LAB — LIPID PANEL
Chol/HDL Ratio: 3.7 ratio (ref 0.0–5.0)
Cholesterol, Total: 161 mg/dL (ref 100–199)
HDL: 44 mg/dL (ref >39)
LDL Chol Calc (NIH): 69 mg/dL (ref 0–99)
Triglycerides: 299 mg/dL — ABNORMAL HIGH (ref 0–149)
VLDL Cholesterol Cal: 48 mg/dL — ABNORMAL HIGH (ref 5–40)

## 2020-01-17 LAB — PSA: Prostate Specific Ag, Serum: 0.8 ng/mL (ref 0.0–4.0)

## 2020-01-18 MED FILL — CARVEDILOL 6.25 MG TABLET: 6.25 | 30 days supply | Qty: 90 | Fill #0

## 2020-01-22 ENCOUNTER — Other Ambulatory Visit: Payer: Self-pay | Admitting: Physician Assistant

## 2020-01-22 DIAGNOSIS — R7303 Prediabetes: Secondary | ICD-10-CM

## 2020-01-22 MED ORDER — METFORMIN HCL 500 MG PO TABS: ORAL_TABLET | ORAL | 2 refills | Status: DC

## 2020-01-22 MED ORDER — FISH OIL 1000 MG PO CPDR: 3.0000 | DELAYED_RELEASE_CAPSULE | Freq: Every day | ORAL | 3 refills | Status: DC

## 2020-01-23 NOTE — Progress Notes (Signed)
Patient notified of results & recommendations. Expressed understanding.

## 2020-01-27 MED FILL — ENTRESTO 24 MG-26 MG TABLET: 24-26 | 30 days supply | Qty: 60 | Fill #2

## 2020-02-20 MED FILL — PANTOPRAZOLE SOD DR 40 MG T: 40 | 30 days supply | Qty: 30 | Fill #1

## 2020-02-20 MED FILL — METFORMIN HCL 500 MG TABS: 500 | 30 days supply | Qty: 30 | Fill #1

## 2020-02-26 ENCOUNTER — Other Ambulatory Visit: Payer: Self-pay | Admitting: Internal Medicine

## 2020-02-26 DIAGNOSIS — I5020 Unspecified systolic (congestive) heart failure: Secondary | ICD-10-CM

## 2020-02-26 MED FILL — ATORVASTATIN 80 MG TABLET: 80 | 30 days supply | Qty: 30 | Fill #1

## 2020-02-26 MED FILL — SPIRONOLACTONE 25 MG TABS: 25 | 30 days supply | Qty: 30 | Fill #1

## 2020-02-26 MED FILL — ENTRESTO 24 MG-26 MG TABLET: 24-26 | 30 days supply | Qty: 60 | Fill #0

## 2020-03-22 MED FILL — METFORMIN HCL 500 MG TABS: 500 | 30 days supply | Qty: 30 | Fill #2

## 2020-03-22 MED FILL — PANTOPRAZOLE SOD DR 40 MG T: 40 | 30 days supply | Qty: 30 | Fill #2

## 2020-04-01 MED FILL — ENTRESTO 24 MG-26 MG TABLET: 24-26 | 30 days supply | Qty: 60 | Fill #1

## 2020-04-01 MED FILL — ATORVASTATIN 80 MG TABLET: 80 | 30 days supply | Qty: 30 | Fill #2

## 2020-04-01 MED FILL — SPIRONOLACTONE 25 MG TABS: 25 | 30 days supply | Qty: 30 | Fill #2

## 2020-04-22 MED FILL — PANTOPRAZOLE SOD DR 40 MG T: 40 | 30 days supply | Qty: 30 | Fill #3

## 2020-04-22 MED FILL — CARVEDILOL 6.25 MG TABLET: 6.25 | 30 days supply | Qty: 90 | Fill #2

## 2020-04-22 MED FILL — METFORMIN HCL 500 MG TABS: 500 | 30 days supply | Qty: 30 | Fill #0

## 2020-04-27 ENCOUNTER — Other Ambulatory Visit: Payer: Self-pay | Admitting: Pharmacist

## 2020-04-27 DIAGNOSIS — R7303 Prediabetes: Secondary | ICD-10-CM

## 2020-04-27 MED ORDER — METFORMIN HCL 500 MG PO TABS: ORAL_TABLET | ORAL | 1 refills | Status: DC

## 2020-05-11 ENCOUNTER — Other Ambulatory Visit: Payer: Self-pay | Admitting: Physician Assistant

## 2020-05-11 DIAGNOSIS — I5020 Unspecified systolic (congestive) heart failure: Secondary | ICD-10-CM

## 2020-05-11 DIAGNOSIS — I10 Essential (primary) hypertension: Secondary | ICD-10-CM

## 2020-05-11 DIAGNOSIS — E785 Hyperlipidemia, unspecified: Secondary | ICD-10-CM

## 2020-05-11 MED FILL — ENTRESTO 24 MG-26 MG TABLET: 24-26 | 30 days supply | Qty: 60 | Fill #2

## 2020-05-14 ENCOUNTER — Other Ambulatory Visit: Payer: Self-pay | Admitting: Family

## 2020-05-14 MED FILL — ATORVASTATIN 80 MG TABLET: 80 | 30 days supply | Qty: 30 | Fill #0

## 2020-05-14 MED FILL — SPIRONOLACTONE 25 MG TABS: 25 | 30 days supply | Qty: 30 | Fill #0

## 2020-05-14 NOTE — Telephone Encounter (Signed)
Prescribed 30 day refill. Please schedule appointment with primary physician for additional refills.

## 2020-05-19 ENCOUNTER — Ambulatory Visit: Payer: No Typology Code available for payment source | Admitting: Internal Medicine

## 2020-05-28 MED FILL — METFORMIN HCL 500 MG TABS: 500 | 30 days supply | Qty: 30 | Fill #1

## 2020-05-28 MED FILL — PANTOPRAZOLE SOD DR 40 MG T: 40 | 30 days supply | Qty: 30 | Fill #4

## 2020-06-07 ENCOUNTER — Encounter: Payer: Self-pay | Admitting: Internal Medicine

## 2020-06-07 ENCOUNTER — Other Ambulatory Visit: Payer: Self-pay

## 2020-06-07 ENCOUNTER — Ambulatory Visit (INDEPENDENT_AMBULATORY_CARE_PROVIDER_SITE_OTHER): Payer: No Typology Code available for payment source | Admitting: Internal Medicine

## 2020-06-07 VITALS — BP 143/91 | HR 66 | Temp 97.5°F | Ht 68.0 in | Wt 179.8 lb

## 2020-06-07 DIAGNOSIS — E785 Hyperlipidemia, unspecified: Secondary | ICD-10-CM | POA: Diagnosis not present

## 2020-06-07 DIAGNOSIS — Z1211 Encounter for screening for malignant neoplasm of colon: Secondary | ICD-10-CM | POA: Diagnosis not present

## 2020-06-07 DIAGNOSIS — R7303 Prediabetes: Secondary | ICD-10-CM | POA: Diagnosis not present

## 2020-06-07 DIAGNOSIS — Z125 Encounter for screening for malignant neoplasm of prostate: Secondary | ICD-10-CM

## 2020-06-07 DIAGNOSIS — Z0000 Encounter for general adult medical examination without abnormal findings: Secondary | ICD-10-CM | POA: Diagnosis not present

## 2020-06-07 NOTE — Progress Notes (Addendum)
Subjective:    Jerome Barnett - 50 y.o. male MRN [id number]  Date of birth: [birth year]  HPI  Jerome Barnett is here for annual exam. Reports that had a COVID vaccine booster 03/06/20. Unfortunately, his 91 year old mother who lives in [place] was diagnosed with uterine cancer. He is trying to figure out how to take time off work to be with her but country is requiring a mandatory 2 week quarantine.    Not taking fish oil for recent h/o elevated triglycerides.   Health Maintenance:  Health Maintenance Due  Topic Date Due  . COLONOSCOPY (Pts 45-27yrs Insurance coverage will need to be confirmed)  Never done  . COVID-19 Vaccine (3 - Booster for Pfizer series) 01/07/2020    -  reports that he quit smoking about 8 years ago. He has a 40.00 pack-year smoking history. He has never used smokeless tobacco. - Review of Systems: Per HPI. - Past Medical History: Patient Active Problem List   Diagnosis Date Noted  . Essential hypertension 09/09/2015  . Heart failure with reduced ejection fraction, NYHA class II ([place]) 07/20/2015  . Hyperlipidemia   . TIA (transient ischemic attack) 07/02/2015   - Medications: reviewed and updated   Objective:   Physical Exam BP (!) 143/91 (BP Location: Right Arm, Patient Position: Sitting, Cuff Size: Normal)   Pulse 66   Temp (!) 97.5 F (36.4 C) (Temporal)   Ht 5\' 8"  (1.727 m)   Wt 179 lb 12.8 oz (81.6 kg)   SpO2 99%   BMI 27.34 kg/m  Physical Exam Constitutional:      Appearance: He is not diaphoretic.  HENT:     Head: Normocephalic and atraumatic.  Eyes:     Conjunctiva/sclera: Conjunctivae normal.     Pupils: Pupils are equal, round, and reactive to light.  Neck:     Thyroid: No thyromegaly.  Cardiovascular:     Rate and Rhythm: Normal rate and regular rhythm.     Heart sounds: Normal heart sounds. No murmur heard.   Pulmonary:     Effort: Pulmonary effort is normal. No respiratory distress.     Breath sounds: Normal  breath sounds. No wheezing.  Abdominal:     General: Bowel sounds are normal. There is no distension.     Palpations: Abdomen is soft.     Tenderness: There is no abdominal tenderness. There is no guarding or rebound.  Musculoskeletal:        General: No deformity. Normal range of motion.     Cervical back: Normal range of motion and neck supple.  Lymphadenopathy:     Cervical: No cervical adenopathy.  Skin:    General: Skin is warm and dry.     Findings: No rash.  Neurological:     Mental Status: He is alert and oriented to person, place, and time.     Gait: Gait is intact.  Psychiatric:        Mood and Affect: Mood and affect normal.        Judgment: Judgment normal.            Assessment & Plan:   1. Encounter for annual physical exam Counseled on 150 minutes of exercise per week, healthy eating (including decreased daily intake of saturated fats, cholesterol, added sugars, sodium), STI prevention, routine healthcare maintenance.  2. Prediabetes A1c 6.0 in Oct 2021. Reports compliance with Metformin.  - Hemoglobin A1c  3. Hyperlipidemia, unspecified hyperlipidemia type Triglycerides 299 at last check Oct 2021. Was recommended  to start fish oil and did not do so. Will monitor today.  - Comprehensive metabolic panel - Lipid panel  4. Screening for colon cancer - Ambulatory referral to Gastroenterology  5. Screening for prostate cancer Discussed risk vs. benefit of screening for prostate cancer. Patient opted in. - PSA   Phill Myron, D.O. 06/07/2020, 11:22 AM Primary Care at [facility]

## 2020-06-07 NOTE — Patient Instructions (Signed)

## 2020-06-08 LAB — COMPREHENSIVE METABOLIC PANEL
ALT: 19 IU/L (ref 0–44)
AST: 16 IU/L (ref 0–40)
Albumin/Globulin Ratio: 1.6 (ref 1.2–2.2)
Albumin: 4.6 g/dL (ref 3.8–4.9)
Alkaline Phosphatase: 47 IU/L (ref 44–121)
BUN/Creatinine Ratio: 10 (ref 9–20)
BUN: 12 mg/dL (ref 6–24)
Bilirubin Total: 0.7 mg/dL (ref 0.0–1.2)
CO2: 21 mmol/L (ref 20–29)
Calcium: 9.5 mg/dL (ref 8.7–10.2)
Chloride: 102 mmol/L (ref 96–106)
Creatinine, Ser: 1.15 mg/dL (ref 0.76–1.27)
Globulin, Total: 2.8 g/dL (ref 1.5–4.5)
Glucose: 109 mg/dL — ABNORMAL HIGH (ref 65–99)
Potassium: 4.3 mmol/L (ref 3.5–5.2)
Sodium: 140 mmol/L (ref 134–144)
Total Protein: 7.4 g/dL (ref 6.0–8.5)
eGFR: 77 mL/min/{1.73_m2} (ref >59)

## 2020-06-08 LAB — LIPID PANEL
Chol/HDL Ratio: 2.8 ratio (ref 0.0–5.0)
Cholesterol, Total: 127 mg/dL (ref 100–199)
HDL: 45 mg/dL (ref >39)
LDL Chol Calc (NIH): 63 mg/dL (ref 0–99)
Triglycerides: 105 mg/dL (ref 0–149)
VLDL Cholesterol Cal: 19 mg/dL (ref 5–40)

## 2020-06-08 LAB — HEMOGLOBIN A1C
Est. average glucose Bld gHb Est-mCnc: 123 mg/dL
Hgb A1c MFr Bld: 5.9 % — ABNORMAL HIGH (ref 4.8–5.6)

## 2020-06-08 LAB — PSA: Prostate Specific Ag, Serum: 0.8 ng/mL (ref 0.0–4.0)

## 2020-06-11 ENCOUNTER — Ambulatory Visit: Payer: No Typology Code available for payment source | Admitting: Internal Medicine

## 2020-06-11 ENCOUNTER — Other Ambulatory Visit (HOSPITAL_BASED_OUTPATIENT_CLINIC_OR_DEPARTMENT_OTHER): Payer: Self-pay

## 2020-06-14 ENCOUNTER — Other Ambulatory Visit: Payer: Self-pay | Admitting: Internal Medicine

## 2020-06-14 DIAGNOSIS — I5020 Unspecified systolic (congestive) heart failure: Secondary | ICD-10-CM

## 2020-06-14 MED FILL — CARVEDILOL 6.25 MG TABLET: 6.25 | 30 days supply | Qty: 90 | Fill #1

## 2020-06-14 MED FILL — SPIRONOLACTONE 25 MG TABS: 25 | 30 days supply | Qty: 30 | Fill #2

## 2020-06-14 MED FILL — ATORVASTATIN 80 MG TABLET: 80 | 30 days supply | Qty: 30 | Fill #2

## 2020-06-17 ENCOUNTER — Other Ambulatory Visit: Payer: Self-pay | Admitting: Internal Medicine

## 2020-06-17 MED FILL — ENTRESTO 24 MG-26 MG TABLET: 24-26 | 30 days supply | Qty: 60 | Fill #0

## 2020-06-21 ENCOUNTER — Other Ambulatory Visit: Payer: Self-pay

## 2020-06-21 ENCOUNTER — Other Ambulatory Visit (HOSPITAL_COMMUNITY): Payer: Self-pay

## 2020-06-21 DIAGNOSIS — I5020 Unspecified systolic (congestive) heart failure: Secondary | ICD-10-CM

## 2020-06-21 DIAGNOSIS — I10 Essential (primary) hypertension: Secondary | ICD-10-CM

## 2020-06-21 DIAGNOSIS — E785 Hyperlipidemia, unspecified: Secondary | ICD-10-CM

## 2020-06-21 MED ORDER — ATORVASTATIN CALCIUM 80 MG PO TABS
80.0000 mg | ORAL_TABLET | Freq: Every day | ORAL | 1 refills | Status: DC
  Filled 2020-06-21: qty 30, 30d supply, fill #0
  Filled 2020-08-19: qty 30, 30d supply, fill #1

## 2020-06-21 NOTE — Progress Notes (Signed)
Atorvastatin 80mg  refilled

## 2020-06-23 IMAGING — DX DG CHEST 2V
2 series · 2 of 2 positions shown · non-contrast
Comparison: 10/22/2017

CLINICAL DATA: Shortness of breath

EXAM:
CHEST - 2 VIEW

[chest pa]
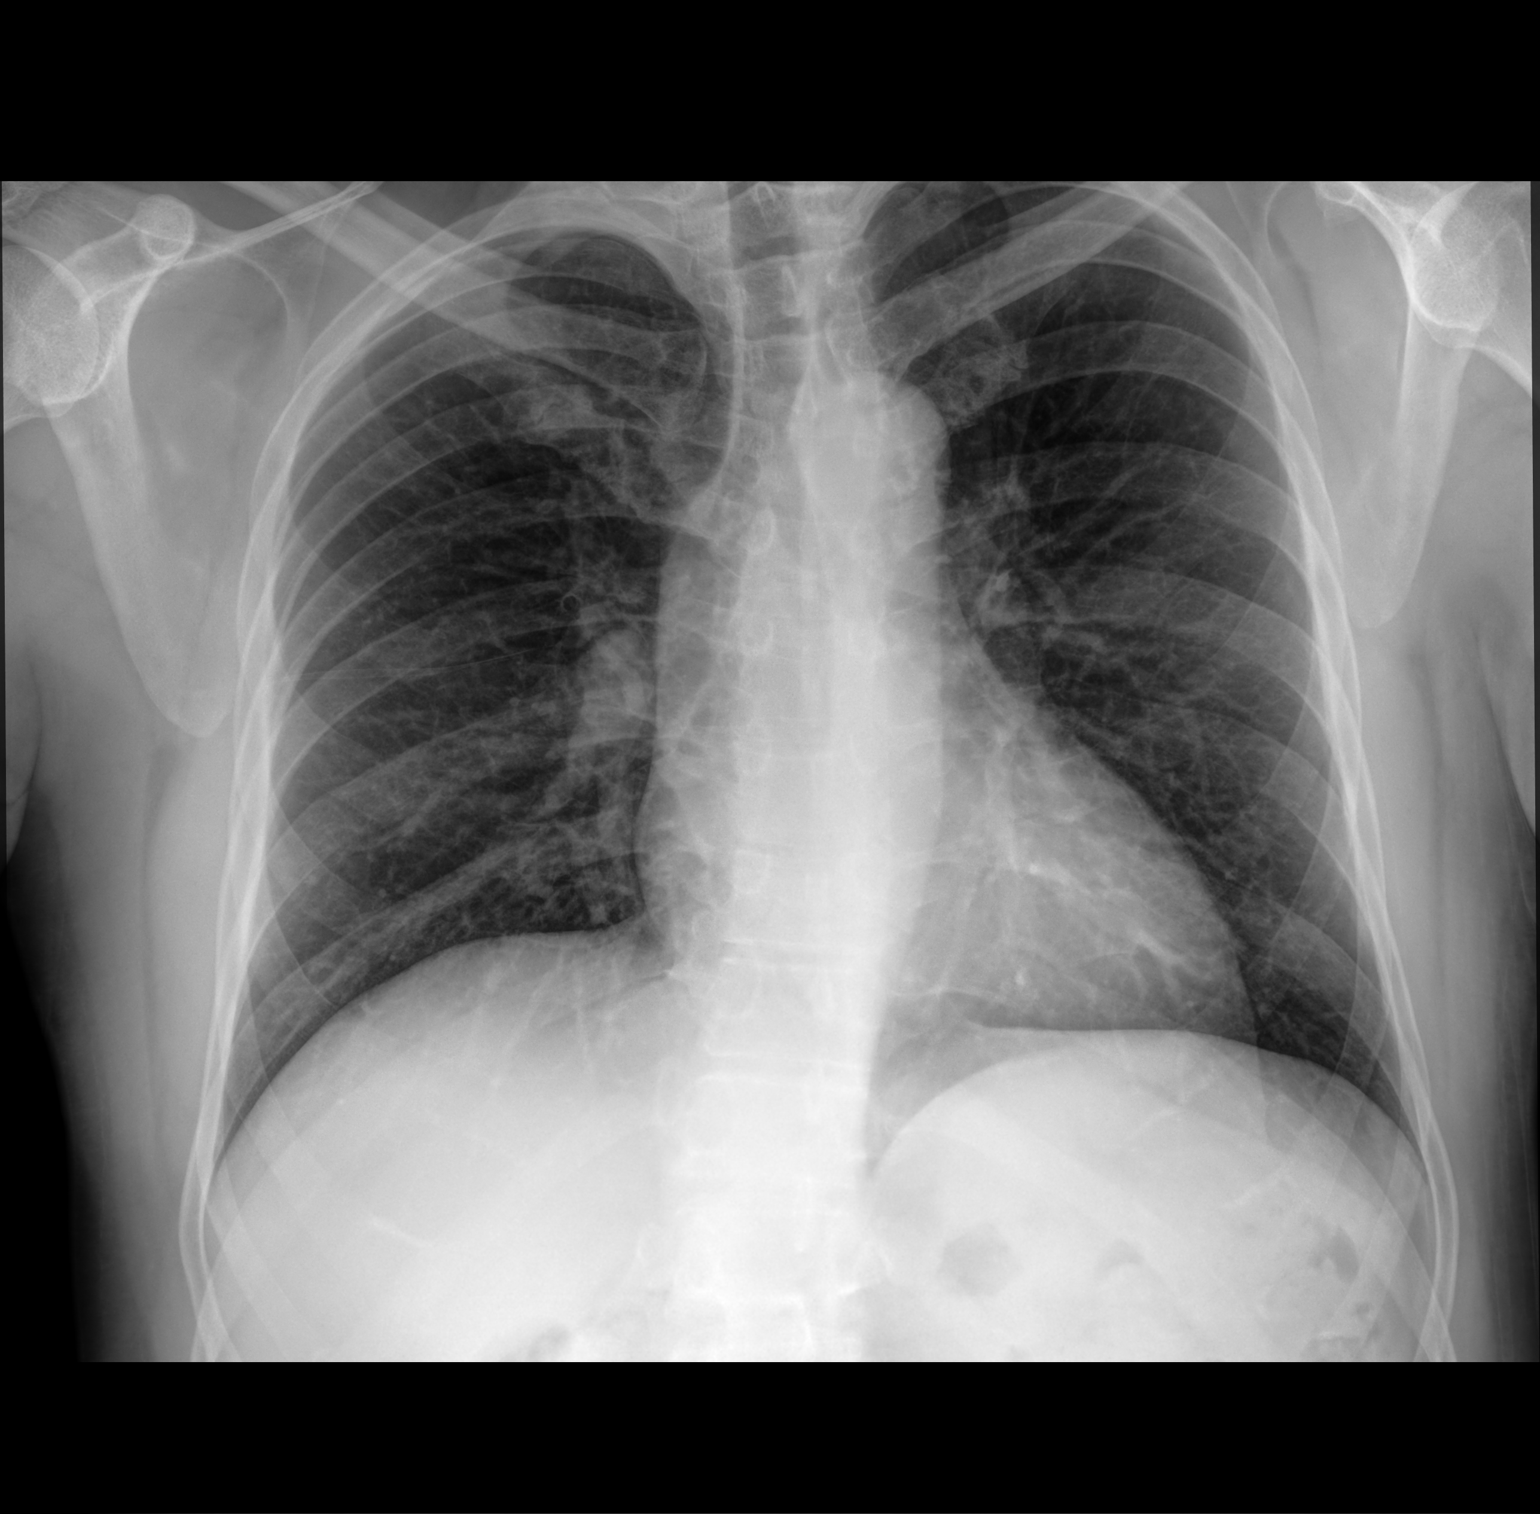

[chest lat]
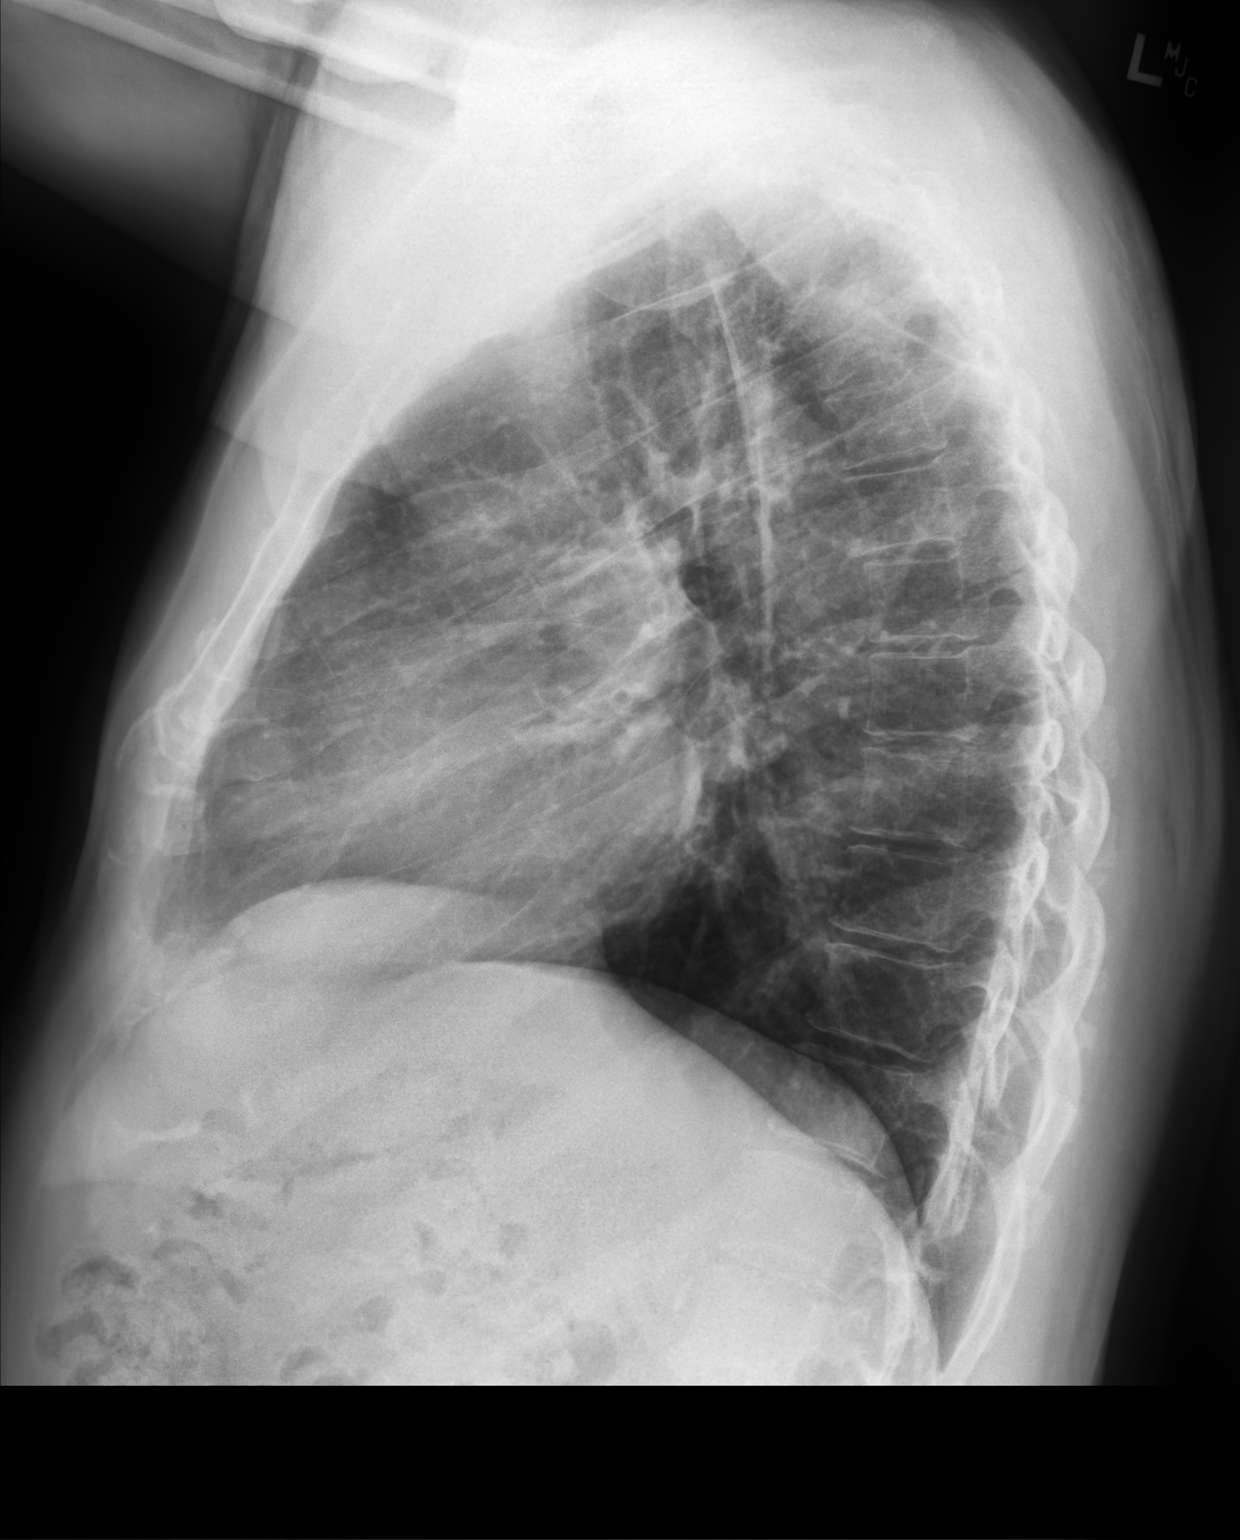

[2 of 2 positions shown; findings below may reference images not displayed]

FINDINGS: Normal heart size and mediastinal contours. No acute infiltrate or
edema. No effusion or pneumothorax. No acute osseous findings.
IMPRESSION: Negative chest.

## 2020-06-24 ENCOUNTER — Other Ambulatory Visit (HOSPITAL_COMMUNITY): Payer: Self-pay

## 2020-06-28 ENCOUNTER — Other Ambulatory Visit (HOSPITAL_COMMUNITY): Payer: Self-pay

## 2020-06-29 ENCOUNTER — Other Ambulatory Visit (HOSPITAL_COMMUNITY): Payer: Self-pay

## 2020-06-30 ENCOUNTER — Other Ambulatory Visit (HOSPITAL_COMMUNITY): Payer: Self-pay

## 2020-06-30 MED FILL — Pantoprazole Sodium EC Tab 40 MG (Base Equiv): ORAL | 30 days supply | Qty: 30 | Fill #0 | Status: AC

## 2020-07-01 ENCOUNTER — Other Ambulatory Visit (HOSPITAL_COMMUNITY): Payer: Self-pay

## 2020-07-01 MED FILL — Metformin HCl Tab 500 MG: ORAL | 30 days supply | Qty: 30 | Fill #0 | Status: AC

## 2020-07-02 ENCOUNTER — Other Ambulatory Visit (HOSPITAL_COMMUNITY): Payer: Self-pay

## 2020-07-06 ENCOUNTER — Other Ambulatory Visit (HOSPITAL_COMMUNITY): Payer: Self-pay

## 2020-07-07 ENCOUNTER — Other Ambulatory Visit (HOSPITAL_COMMUNITY): Payer: Self-pay

## 2020-07-19 MED FILL — Sacubitril-Valsartan Tab 24-26 MG: ORAL | 30 days supply | Qty: 60 | Fill #0 | Status: AC

## 2020-07-20 ENCOUNTER — Other Ambulatory Visit (HOSPITAL_COMMUNITY): Payer: Self-pay

## 2020-07-20 ENCOUNTER — Other Ambulatory Visit: Payer: Self-pay | Admitting: Family

## 2020-07-20 DIAGNOSIS — I10 Essential (primary) hypertension: Secondary | ICD-10-CM

## 2020-07-20 DIAGNOSIS — I5020 Unspecified systolic (congestive) heart failure: Secondary | ICD-10-CM

## 2020-07-22 ENCOUNTER — Other Ambulatory Visit (HOSPITAL_COMMUNITY): Payer: Self-pay

## 2020-07-22 ENCOUNTER — Other Ambulatory Visit: Payer: Self-pay | Admitting: Family

## 2020-07-22 DIAGNOSIS — I10 Essential (primary) hypertension: Secondary | ICD-10-CM

## 2020-07-22 DIAGNOSIS — I5020 Unspecified systolic (congestive) heart failure: Secondary | ICD-10-CM

## 2020-07-23 ENCOUNTER — Other Ambulatory Visit (HOSPITAL_COMMUNITY): Payer: Self-pay

## 2020-07-26 ENCOUNTER — Other Ambulatory Visit: Payer: Self-pay | Admitting: Family

## 2020-07-26 ENCOUNTER — Other Ambulatory Visit (HOSPITAL_COMMUNITY): Payer: Self-pay

## 2020-07-26 DIAGNOSIS — I10 Essential (primary) hypertension: Secondary | ICD-10-CM

## 2020-07-26 DIAGNOSIS — I5020 Unspecified systolic (congestive) heart failure: Secondary | ICD-10-CM

## 2020-07-28 ENCOUNTER — Telehealth: Payer: Self-pay | Admitting: Family

## 2020-07-28 ENCOUNTER — Other Ambulatory Visit: Payer: Self-pay

## 2020-07-28 ENCOUNTER — Other Ambulatory Visit (HOSPITAL_COMMUNITY): Payer: Self-pay

## 2020-07-28 NOTE — Telephone Encounter (Signed)
Jerome Barnett called stating medication refill faxes have been sent for spironolactone (ALDACTONE) 25 MG tablet. Wanted to make sure it had been received.

## 2020-07-29 ENCOUNTER — Other Ambulatory Visit: Payer: Self-pay

## 2020-07-29 ENCOUNTER — Other Ambulatory Visit: Payer: Self-pay | Admitting: Family

## 2020-07-29 ENCOUNTER — Other Ambulatory Visit (HOSPITAL_COMMUNITY): Payer: Self-pay

## 2020-07-29 DIAGNOSIS — I5020 Unspecified systolic (congestive) heart failure: Secondary | ICD-10-CM

## 2020-07-29 DIAGNOSIS — I10 Essential (primary) hypertension: Secondary | ICD-10-CM

## 2020-08-04 ENCOUNTER — Other Ambulatory Visit: Payer: Self-pay | Admitting: Internal Medicine

## 2020-08-04 ENCOUNTER — Other Ambulatory Visit (HOSPITAL_COMMUNITY): Payer: Self-pay

## 2020-08-04 ENCOUNTER — Other Ambulatory Visit: Payer: Self-pay | Admitting: Physician Assistant

## 2020-08-04 DIAGNOSIS — Z7982 Long term (current) use of aspirin: Secondary | ICD-10-CM

## 2020-08-04 DIAGNOSIS — I10 Essential (primary) hypertension: Secondary | ICD-10-CM

## 2020-08-04 DIAGNOSIS — I5020 Unspecified systolic (congestive) heart failure: Secondary | ICD-10-CM

## 2020-08-04 DIAGNOSIS — K219 Gastro-esophageal reflux disease without esophagitis: Secondary | ICD-10-CM

## 2020-08-04 MED FILL — Metformin HCl Tab 500 MG: ORAL | 30 days supply | Qty: 30 | Fill #1 | Status: AC

## 2020-08-04 MED FILL — Carvedilol Tab 6.25 MG: ORAL | 30 days supply | Qty: 90 | Fill #0 | Status: AC

## 2020-08-06 ENCOUNTER — Other Ambulatory Visit (HOSPITAL_COMMUNITY): Payer: Self-pay

## 2020-08-06 MED ORDER — SPIRONOLACTONE 25 MG PO TABS
ORAL_TABLET | ORAL | 0 refills | Status: DC
  Filled 2020-08-06: qty 30, 30d supply, fill #0

## 2020-08-09 ENCOUNTER — Other Ambulatory Visit: Payer: Self-pay | Admitting: Physician Assistant

## 2020-08-09 ENCOUNTER — Other Ambulatory Visit (HOSPITAL_COMMUNITY): Payer: Self-pay

## 2020-08-09 DIAGNOSIS — K219 Gastro-esophageal reflux disease without esophagitis: Secondary | ICD-10-CM

## 2020-08-09 DIAGNOSIS — Z7982 Long term (current) use of aspirin: Secondary | ICD-10-CM

## 2020-08-10 ENCOUNTER — Other Ambulatory Visit (HOSPITAL_COMMUNITY): Payer: Self-pay

## 2020-08-10 MED ORDER — PANTOPRAZOLE SODIUM 40 MG PO TBEC
DELAYED_RELEASE_TABLET | Freq: Every day | ORAL | 5 refills | Status: DC
  Filled 2020-08-10: qty 30, 30d supply, fill #0
  Filled 2020-09-14: qty 30, 30d supply, fill #1
  Filled 2020-10-15: qty 30, 30d supply, fill #2
  Filled 2020-11-15: qty 30, 30d supply, fill #3
  Filled 2020-12-20: qty 30, 30d supply, fill #4
  Filled 2021-01-20: qty 30, 30d supply, fill #5

## 2020-08-19 MED FILL — Sacubitril-Valsartan Tab 24-26 MG: ORAL | 30 days supply | Qty: 60 | Fill #1 | Status: AC

## 2020-08-20 ENCOUNTER — Other Ambulatory Visit (HOSPITAL_COMMUNITY): Payer: Self-pay

## 2020-09-13 ENCOUNTER — Other Ambulatory Visit: Payer: Self-pay | Admitting: Family

## 2020-09-13 ENCOUNTER — Other Ambulatory Visit: Payer: Self-pay | Admitting: Family Medicine

## 2020-09-13 DIAGNOSIS — I10 Essential (primary) hypertension: Secondary | ICD-10-CM

## 2020-09-13 DIAGNOSIS — R7303 Prediabetes: Secondary | ICD-10-CM

## 2020-09-13 DIAGNOSIS — I5020 Unspecified systolic (congestive) heart failure: Secondary | ICD-10-CM

## 2020-09-14 ENCOUNTER — Other Ambulatory Visit: Payer: Self-pay | Admitting: Family

## 2020-09-14 ENCOUNTER — Other Ambulatory Visit (HOSPITAL_COMMUNITY): Payer: Self-pay

## 2020-09-14 DIAGNOSIS — R7303 Prediabetes: Secondary | ICD-10-CM

## 2020-09-14 MED ORDER — METFORMIN HCL 500 MG PO TABS
500.0000 mg | ORAL_TABLET | Freq: Every day | ORAL | 0 refills | Status: DC
  Filled 2020-09-14: qty 90, 90d supply, fill #0

## 2020-09-14 NOTE — Progress Notes (Signed)
Last visit 06/07/2020 with Phill Myron, DO.   Per patient request Metformin refilled.   Please schedule office visit for additional refills.

## 2020-09-15 ENCOUNTER — Other Ambulatory Visit (HOSPITAL_COMMUNITY): Payer: Self-pay

## 2020-09-21 ENCOUNTER — Other Ambulatory Visit (HOSPITAL_COMMUNITY): Payer: Self-pay

## 2020-09-27 ENCOUNTER — Other Ambulatory Visit: Payer: Self-pay | Admitting: Internal Medicine

## 2020-09-27 ENCOUNTER — Other Ambulatory Visit: Payer: Self-pay | Admitting: Physician Assistant

## 2020-09-27 DIAGNOSIS — I5020 Unspecified systolic (congestive) heart failure: Secondary | ICD-10-CM

## 2020-09-27 DIAGNOSIS — E785 Hyperlipidemia, unspecified: Secondary | ICD-10-CM

## 2020-09-27 NOTE — Telephone Encounter (Signed)
Requested medication (s) are due for refill today: Yes  Requested medication (s) are on the active medication list: Yes  Last refill:  10/28.21  Future visit scheduled: No  Notes to clinic:  Deos Ms. McClung follow pt.?    Requested Prescriptions  Pending Prescriptions Disp Refills   carvedilol (COREG) 6.25 MG tablet 90 tablet 2      There is no refill protocol information for this order

## 2020-09-28 ENCOUNTER — Other Ambulatory Visit (HOSPITAL_COMMUNITY): Payer: Self-pay

## 2020-09-28 ENCOUNTER — Other Ambulatory Visit (HOSPITAL_COMMUNITY): Payer: Self-pay | Admitting: *Deleted

## 2020-09-28 DIAGNOSIS — I10 Essential (primary) hypertension: Secondary | ICD-10-CM

## 2020-09-28 DIAGNOSIS — I5020 Unspecified systolic (congestive) heart failure: Secondary | ICD-10-CM

## 2020-09-28 DIAGNOSIS — E785 Hyperlipidemia, unspecified: Secondary | ICD-10-CM

## 2020-09-28 MED ORDER — ATORVASTATIN CALCIUM 80 MG PO TABS
80.0000 mg | ORAL_TABLET | Freq: Every day | ORAL | 1 refills | Status: DC
  Filled 2020-09-28: qty 30, 30d supply, fill #0
  Filled 2020-10-28: qty 30, 30d supply, fill #1

## 2020-09-28 MED ORDER — SPIRONOLACTONE 25 MG PO TABS
ORAL_TABLET | ORAL | 0 refills | Status: DC
  Filled 2020-09-28: qty 30, 30d supply, fill #0

## 2020-09-28 MED ORDER — SACUBITRIL-VALSARTAN 24-26 MG PO TABS
ORAL_TABLET | ORAL | 2 refills | Status: DC
  Filled 2020-09-28: qty 60, 30d supply, fill #0
  Filled 2020-10-28: qty 60, 30d supply, fill #1
  Filled 2020-12-05: qty 60, 30d supply, fill #2

## 2020-09-28 MED ORDER — CARVEDILOL 6.25 MG PO TABS
ORAL_TABLET | ORAL | 2 refills | Status: DC
  Filled 2020-09-28: qty 90, 30d supply, fill #0

## 2020-09-28 NOTE — Telephone Encounter (Signed)
Patient followed by Cardiology for management of hypertension.   Carvedilol refilled on 09/28/2020 by Glori Bickers, MD.

## 2020-09-29 ENCOUNTER — Other Ambulatory Visit (HOSPITAL_COMMUNITY): Payer: Self-pay

## 2020-10-15 ENCOUNTER — Other Ambulatory Visit (HOSPITAL_COMMUNITY): Payer: Self-pay

## 2020-10-19 NOTE — Progress Notes (Signed)
Patient ID: Jerome Barnett, male   DOB: 1970-10-19, 50 y.o.   MRN: [id number]    [facility]  NOTE  Primary HF: Dr. Haroldine Laws  PCP: [name], MD   HPI: Jerome Barnett is a 50 year old male with h/o HL, HTN and GERD who was admitted in 4/17 with acute HF. EF 15%.  Underwent cath in 4/17 normal cors with low filling pressures and normal cardiac output. Procedure complicated by radial artery spasm requiring multiple rounds of verapamil and NTG. Patient then developed shock and was supported with norepi and milrinone transiently. Was discharged home and then readmitted several days later with hypotension in setting of volume depletion due to severe restriction of po intake.    Seen in ED 08/03/15 and found to be orthostatic. Symptoms improved with 500 cc of IVF.   Admitted 10/22/17 with increased dyspnea. ECHO performed and showed EF 45-50% (improved) . Troponin was negative and BNP was not elevated so he was discharged   Was seen in HF Clinic 8/16 with palpitations and dizziness. Around that time was getting separated from his wife and had to put dog down.   Zio patch 9/19 1. Sinus rhythm with rare PACs and PVCs. 2. No significant arrhythmias 3. Diary events of dizziness occasionaly correspond to PVCs but also triggered with NSR  Here for routine f/u. Feeling pretty good. Denies further palpitations. Going to gym 3x/week. Doing cardio with bike and TM running + weights.    Cardiac Studies   ECHO 10/2017  Left ventricle: The cavity size was normal. Wall thickness was   normal. Systolic function was mildly reduced. The estimated   ejection fraction was in the range of 45% to 50%. Diffuse   hypokinesis. The study is not technically sufficient to allow   evaluation of LV diastolic function. GLS: -15% - Aortic valve: There was mild regurgitation. - Mitral valve: There was mild regurgitation. - Left atrium: The atrium was mildly dilated.   Echo 07/04/15 LVEF  10-15%, normal RV ECHO 01/08/16 EF ~40% normal RV  cMRI  06/16/16  LVEF 43% RV 41% NICM  RHC/LHC  06/2015  RA = 2 RV = 28/0/4 PA = 28/11 (18) PCW = 5 Fick cardiac output/index = 4.3/2.3 PVR = 3.0 WU FA sat = 94% PA sat = 60%, 63% Assessment: 1. Normal coronary arteries 2. Low filling pressures with normal cardiac output 3. Severe NICM with EF 10% by echo 3. Development of severe hypotension and shock in cath lab due to vasodilators to treat radial artery spasm    Current Outpatient Medications  Medication Sig Dispense Refill   aspirin 81 MG chewable tablet One pill by mouth daily 30 tablet 11   atorvastatin (LIPITOR) 80 MG tablet Take 1 tablet (80 mg total) by mouth daily. 30 tablet 1   carvedilol (COREG) 6.25 MG tablet Take 6.25 mg by mouth 2 (two) times daily with a meal.     Cetirizine HCl (ZYRTEC ALLERGY PO) Take 1 tablet by mouth daily.     metFORMIN (GLUCOPHAGE) 500 MG tablet Take 1 tablet (500 mg total) by mouth daily with breakfast. 90 tablet 0   pantoprazole (PROTONIX) 40 MG tablet TAKE 1 TABLET BY MOUTH DAILY. 30 tablet 5   sacubitril-valsartan (ENTRESTO) 24-26 MG TAKE 1 TABLET BY MOUTH 2 TIMES DAILY (NEEDS OFFICE VISIT) 60 tablet 2   spironolactone (ALDACTONE) 25 MG tablet TAKE 1 TABLET BY MOUTH DAILY. NEEDS APPT FOR FURTHER REFILLS 30 tablet 0   No current facility-administered  medications for this encounter.    Allergies  Allergen Reactions   Amoxicillin Swelling    Eye swelling   Lasix [Furosemide] Swelling    Eye swelling      Social History   Socioeconomic History   Marital status: Divorced    Spouse name: Not on file   Number of children: Not on file   Years of education: Not on file   Highest education level: Not on file  Occupational History   Not on file  Tobacco Use   Smoking status: Former    Packs/day: 2.00    Years: 20.00    Pack years: 40.00    Types: Cigarettes    Quit date: 03/20/2012    Years since quitting: 8.5   Smokeless  tobacco: Never  Vaping Use   Vaping Use: Never used  Substance and Sexual Activity   Alcohol use: No    Alcohol/week: 0.0 standard drinks   Drug use: No   Sexual activity: Not on file  Other Topics Concern   Not on file  Social History Narrative   Patient is separated., no children.   Has pets   Works full time at [employer]    Social Determinants of Radio broadcast assistant Strain: Not on file  Food Insecurity: Not on file  Transportation Needs: Not on file  Physical Activity: Not on file  Stress: Not on file  Social Connections: Not on file  Intimate Partner Violence: Not on file      Family History  Problem Relation Age of Onset   Hyperlipidemia Mother    Hypertension Father    Hyperlipidemia Father    Irritable bowel syndrome Father     Vitals:   10/20/20 1028  BP: 130/80  Pulse: 74  SpO2: 98%  Weight: 82.5 kg (181 lb 12.8 oz)      Wt Readings from Last 3 Encounters:  10/20/20 82.5 kg (181 lb 12.8 oz)  06/07/20 81.6 kg (179 lb 12.8 oz)  01/15/20 80.3 kg (177 lb)     PHYSICAL EXAM: General:  Well appearing. No resp difficulty HEENT: normal Neck: supple. no JVD. Carotids 2+ bilat; no bruits. No lymphadenopathy or thryomegaly appreciated. Cor: PMI nondisplaced. Regular rate & rhythm. No rubs, gallops or murmurs. Lungs: clear Abdomen: soft, nontender, nondistended. No hepatosplenomegaly. No bruits or masses. Good bowel sounds. Extremities: no cyanosis, clubbing, rash, edema Neuro: alert & orientedx3, cranial nerves grossly intact. moves all 4 extremities w/o difficulty. Affect pleasant   ASSESSMENT & PLAN: 1. Chronic systolic heart failure - cath 4/17 with normal coronaries. EF 15%. ? Viral CM. - Echo 12/2015 with improvement of EF ~40%. Bedside Echo 2/18  EF25-30% range. cMRI 3/30 LVEF 43% RV normal  - ECHO 10/2017 EF 45-50%  - Echo 2/20 EF 55-60% RV mild HK  - Doing great. NYHA I - Volume status looks good - Continue spiro 25 mg daily.  -  Continue Entresto 24/26 mg BID.    - Continue carvedilol 9.375 bid - Repeat echo to ensure EF stability  2. HLD - Cont atorvastatin 80 mg.  PCP following - Last LDL in 3/22 was 63   3. HTN - Blood pressure well controlled. Continue current regimen.  4. DM2 - on metformin. Followed by PCP - Can consider SGLT2i as needed  5. Palpitations - Monitor ok. Resolved.    Glori Bickers, MD  10:50 AM

## 2020-10-20 ENCOUNTER — Encounter (HOSPITAL_COMMUNITY): Payer: Self-pay | Admitting: Internal Medicine

## 2020-10-20 ENCOUNTER — Other Ambulatory Visit: Payer: Self-pay

## 2020-10-20 ENCOUNTER — Ambulatory Visit (HOSPITAL_COMMUNITY)
Admission: RE | Admit: 2020-10-20 | Discharge: 2020-10-20 | Disposition: A | Discharge status: Home / Self Care | Payer: No Typology Code available for payment source | Source: Ambulatory Visit | Attending: Internal Medicine | Admitting: Internal Medicine

## 2020-10-20 VITALS — BP 130/80 | HR 74 | Wt 181.8 lb

## 2020-10-20 DIAGNOSIS — Z880 Allergy status to penicillin: Secondary | ICD-10-CM | POA: Insufficient documentation

## 2020-10-20 DIAGNOSIS — I2510 Atherosclerotic heart disease of native coronary artery without angina pectoris: Secondary | ICD-10-CM | POA: Diagnosis not present

## 2020-10-20 DIAGNOSIS — Z888 Allergy status to other drugs, medicaments and biological substances status: Secondary | ICD-10-CM | POA: Diagnosis not present

## 2020-10-20 DIAGNOSIS — E119 Type 2 diabetes mellitus without complications: Secondary | ICD-10-CM | POA: Diagnosis not present

## 2020-10-20 DIAGNOSIS — Z7984 Long term (current) use of oral hypoglycemic drugs: Secondary | ICD-10-CM | POA: Diagnosis not present

## 2020-10-20 DIAGNOSIS — Z8249 Family history of ischemic heart disease and other diseases of the circulatory system: Secondary | ICD-10-CM | POA: Diagnosis not present

## 2020-10-20 DIAGNOSIS — I5022 Chronic systolic (congestive) heart failure: Secondary | ICD-10-CM | POA: Diagnosis not present

## 2020-10-20 DIAGNOSIS — K219 Gastro-esophageal reflux disease without esophagitis: Secondary | ICD-10-CM | POA: Insufficient documentation

## 2020-10-20 DIAGNOSIS — I110 Hypertensive heart disease with heart failure: Secondary | ICD-10-CM | POA: Diagnosis not present

## 2020-10-20 DIAGNOSIS — Z8349 Family history of other endocrine, nutritional and metabolic diseases: Secondary | ICD-10-CM | POA: Diagnosis not present

## 2020-10-20 DIAGNOSIS — Z7982 Long term (current) use of aspirin: Secondary | ICD-10-CM | POA: Insufficient documentation

## 2020-10-20 DIAGNOSIS — R002 Palpitations: Secondary | ICD-10-CM | POA: Insufficient documentation

## 2020-10-20 DIAGNOSIS — Z79899 Other long term (current) drug therapy: Secondary | ICD-10-CM | POA: Insufficient documentation

## 2020-10-20 DIAGNOSIS — E785 Hyperlipidemia, unspecified: Secondary | ICD-10-CM | POA: Insufficient documentation

## 2020-10-20 DIAGNOSIS — Z87891 Personal history of nicotine dependence: Secondary | ICD-10-CM | POA: Insufficient documentation

## 2020-10-20 NOTE — Patient Instructions (Signed)
Your physician has requested that you have an echocardiogram. Echocardiography is a painless test that uses sound waves to create images of your heart. It provides your doctor with information about the size and shape of your heart and how well your heart's chambers and valves are working. This procedure takes approximately one hour. There are no restrictions for this procedure.  Please call our office 1 year to schedule follow-up appointment  If you have any questions or concerns before your next appointment please send [place] a message through [place] or call our office at [phone].    TO LEAVE A MESSAGE FOR THE NURSE SELECT OPTION 2, PLEASE LEAVE A MESSAGE INCLUDING: YOUR NAME DATE OF BIRTH CALL BACK NUMBER REASON FOR CALL**this is important as we prioritize the call backs  YOU WILL RECEIVE A CALL BACK THE SAME DAY AS LONG AS YOU CALL BEFORE 4:00 PM  milAt the [facility], you and your health needs are our priority. As part of our continuing mission to provide you with exceptional heart care, we have created designated Provider Care Teams. These Care Teams include your primary Cardiologist (physician) and Advanced Practice Providers (APPs- Physician Assistants and Nurse Practitioners) who all work together to provide you with the care you need, when you need it.   You may see any of the following providers on your designated Care Team at your next follow up: Dr Glori Bickers Dr Loralie Champagne Dr Patrice Paradise, NP Lyda Jester, [place] Ginnie Smart Audry Riles, PharmD   Please be sure to bring in all your medications bottles to every appointment.

## 2020-10-20 NOTE — Addendum Note (Signed)
Encounter addended by: Scarlette Calico, RN on: 10/20/2020 11:06 AM  Actions taken: Order list changed, Diagnosis association updated, Clinical Note Signed

## 2020-10-28 ENCOUNTER — Other Ambulatory Visit (HOSPITAL_COMMUNITY): Payer: Self-pay | Admitting: Internal Medicine

## 2020-10-28 DIAGNOSIS — I10 Essential (primary) hypertension: Secondary | ICD-10-CM

## 2020-10-28 DIAGNOSIS — I5020 Unspecified systolic (congestive) heart failure: Secondary | ICD-10-CM

## 2020-10-29 ENCOUNTER — Other Ambulatory Visit (HOSPITAL_COMMUNITY): Payer: Self-pay

## 2020-10-29 MED ORDER — SPIRONOLACTONE 25 MG PO TABS
25.0000 mg | ORAL_TABLET | Freq: Every day | ORAL | 6 refills | Status: DC
  Filled 2020-10-29: qty 30, 30d supply, fill #0
  Filled 2020-12-05: qty 30, 30d supply, fill #1
  Filled 2021-01-07: qty 30, 30d supply, fill #2
  Filled 2021-02-14: qty 30, 30d supply, fill #3
  Filled 2021-03-23: qty 30, 30d supply, fill #4
  Filled 2021-04-22: qty 30, 30d supply, fill #5
  Filled 2021-05-29: qty 30, 30d supply, fill #6

## 2020-11-01 ENCOUNTER — Ambulatory Visit (HOSPITAL_COMMUNITY): Payer: No Typology Code available for payment source

## 2020-11-16 ENCOUNTER — Other Ambulatory Visit (HOSPITAL_COMMUNITY): Payer: Self-pay

## 2020-11-16 ENCOUNTER — Other Ambulatory Visit (HOSPITAL_COMMUNITY): Payer: Self-pay | Admitting: Internal Medicine

## 2020-11-16 DIAGNOSIS — I5020 Unspecified systolic (congestive) heart failure: Secondary | ICD-10-CM

## 2020-11-17 ENCOUNTER — Other Ambulatory Visit (HOSPITAL_COMMUNITY): Payer: Self-pay

## 2020-11-18 ENCOUNTER — Other Ambulatory Visit (HOSPITAL_COMMUNITY): Payer: Self-pay

## 2020-11-18 MED ORDER — CARVEDILOL 6.25 MG PO TABS
9.7500 mg | ORAL_TABLET | Freq: Two times a day (BID) | ORAL | 11 refills | Status: DC
  Filled 2020-11-18: qty 135, 45d supply, fill #0
  Filled 2021-02-07: qty 135, 45d supply, fill #1

## 2020-12-05 ENCOUNTER — Other Ambulatory Visit (HOSPITAL_COMMUNITY): Payer: Self-pay | Admitting: Internal Medicine

## 2020-12-05 DIAGNOSIS — E785 Hyperlipidemia, unspecified: Secondary | ICD-10-CM

## 2020-12-06 ENCOUNTER — Other Ambulatory Visit (HOSPITAL_COMMUNITY): Payer: Self-pay

## 2020-12-06 ENCOUNTER — Other Ambulatory Visit (HOSPITAL_COMMUNITY): Payer: Self-pay | Admitting: Internal Medicine

## 2020-12-06 DIAGNOSIS — E785 Hyperlipidemia, unspecified: Secondary | ICD-10-CM

## 2020-12-07 ENCOUNTER — Other Ambulatory Visit (HOSPITAL_COMMUNITY): Payer: Self-pay

## 2020-12-09 ENCOUNTER — Other Ambulatory Visit (HOSPITAL_COMMUNITY): Payer: Self-pay

## 2020-12-21 ENCOUNTER — Other Ambulatory Visit (HOSPITAL_COMMUNITY): Payer: Self-pay

## 2021-01-04 ENCOUNTER — Other Ambulatory Visit (HOSPITAL_COMMUNITY): Payer: Self-pay

## 2021-01-04 ENCOUNTER — Other Ambulatory Visit (HOSPITAL_COMMUNITY): Payer: Self-pay | Admitting: *Deleted

## 2021-01-04 DIAGNOSIS — E785 Hyperlipidemia, unspecified: Secondary | ICD-10-CM

## 2021-01-04 MED ORDER — ATORVASTATIN CALCIUM 80 MG PO TABS
80.0000 mg | ORAL_TABLET | Freq: Every day | ORAL | 3 refills | Status: DC
  Filled 2021-01-04: qty 30, 30d supply, fill #0
  Filled 2021-02-07: qty 30, 30d supply, fill #1

## 2021-01-07 ENCOUNTER — Other Ambulatory Visit (HOSPITAL_COMMUNITY): Payer: Self-pay | Admitting: Internal Medicine

## 2021-01-07 ENCOUNTER — Other Ambulatory Visit: Payer: Self-pay | Admitting: Family

## 2021-01-07 ENCOUNTER — Other Ambulatory Visit (HOSPITAL_COMMUNITY): Payer: Self-pay

## 2021-01-07 DIAGNOSIS — R7303 Prediabetes: Secondary | ICD-10-CM

## 2021-01-07 DIAGNOSIS — I5020 Unspecified systolic (congestive) heart failure: Secondary | ICD-10-CM

## 2021-01-07 MED ORDER — METFORMIN HCL 500 MG PO TABS
500.0000 mg | ORAL_TABLET | Freq: Every day | ORAL | 0 refills | Status: DC
  Filled 2021-01-07: qty 30, 30d supply, fill #0

## 2021-01-07 MED ORDER — ENTRESTO 24-26 MG PO TABS
ORAL_TABLET | ORAL | 2 refills | Status: DC
  Filled 2021-01-07: qty 60, 30d supply, fill #0
  Filled 2021-02-14: qty 60, 30d supply, fill #1
  Filled 2021-03-23: qty 60, 30d supply, fill #2

## 2021-01-20 ENCOUNTER — Other Ambulatory Visit (HOSPITAL_COMMUNITY): Payer: Self-pay

## 2021-02-08 ENCOUNTER — Other Ambulatory Visit (HOSPITAL_COMMUNITY): Payer: Self-pay

## 2021-02-14 ENCOUNTER — Other Ambulatory Visit: Payer: Self-pay | Admitting: Family Medicine

## 2021-02-14 DIAGNOSIS — R7303 Prediabetes: Secondary | ICD-10-CM

## 2021-02-15 ENCOUNTER — Other Ambulatory Visit (HOSPITAL_COMMUNITY): Payer: Self-pay

## 2021-02-23 ENCOUNTER — Ambulatory Visit: Payer: No Typology Code available for payment source | Admitting: Family Medicine

## 2021-02-23 ENCOUNTER — Other Ambulatory Visit: Payer: Self-pay | Admitting: Internal Medicine

## 2021-02-23 ENCOUNTER — Other Ambulatory Visit: Payer: Self-pay

## 2021-02-23 ENCOUNTER — Encounter: Payer: Self-pay | Admitting: Family Medicine

## 2021-02-23 ENCOUNTER — Other Ambulatory Visit (HOSPITAL_COMMUNITY): Payer: Self-pay

## 2021-02-23 VITALS — BP 143/97 | HR 84 | Temp 97.6°F | Resp 16 | Wt 187.0 lb

## 2021-02-23 DIAGNOSIS — Z7982 Long term (current) use of aspirin: Secondary | ICD-10-CM

## 2021-02-23 DIAGNOSIS — K219 Gastro-esophageal reflux disease without esophagitis: Secondary | ICD-10-CM

## 2021-02-23 DIAGNOSIS — R7303 Prediabetes: Secondary | ICD-10-CM

## 2021-02-23 DIAGNOSIS — E785 Hyperlipidemia, unspecified: Secondary | ICD-10-CM | POA: Diagnosis not present

## 2021-02-23 DIAGNOSIS — Z1211 Encounter for screening for malignant neoplasm of colon: Secondary | ICD-10-CM

## 2021-02-23 DIAGNOSIS — Z23 Encounter for immunization: Secondary | ICD-10-CM | POA: Diagnosis not present

## 2021-02-23 LAB — POCT GLYCOSYLATED HEMOGLOBIN (HGB A1C): Hemoglobin A1C: 6 % — AB (ref 4.0–5.6)

## 2021-02-23 MED ORDER — PANTOPRAZOLE SODIUM 40 MG PO TBEC
DELAYED_RELEASE_TABLET | Freq: Every day | ORAL | 1 refills | Status: DC
Start: 2021-02-23 — End: 2021-08-31
  Filled 2021-02-23: qty 90, 90d supply, fill #0
  Filled 2021-05-29: qty 90, 90d supply, fill #1

## 2021-02-23 MED ORDER — METFORMIN HCL 500 MG PO TABS
500.0000 mg | ORAL_TABLET | Freq: Every day | ORAL | 1 refills | Status: DC
Start: 2021-02-23 — End: 2021-08-31
  Filled 2021-02-23: qty 90, 90d supply, fill #0
  Filled 2021-05-29: qty 90, 90d supply, fill #1

## 2021-02-23 MED ORDER — ATORVASTATIN CALCIUM 80 MG PO TABS
80.0000 mg | ORAL_TABLET | Freq: Every day | ORAL | 1 refills | Status: DC
  Filled 2021-02-23: qty 90, 90d supply, fill #0
  Filled 2021-06-16: qty 90, 90d supply, fill #1

## 2021-02-23 MED ORDER — CARVEDILOL 12.5 MG PO TABS
12.5000 mg | ORAL_TABLET | Freq: Two times a day (BID) | ORAL | 1 refills | Status: DC
  Filled 2021-02-23: qty 180, 90d supply, fill #0
  Filled 2021-07-01: qty 180, 90d supply, fill #1

## 2021-02-23 NOTE — Progress Notes (Signed)
New Patient Office Visit  Subjective:  Patient ID: Jerome Barnett, male    DOB: 16-May-1970  Age: 50 y.o. MRN: [id number]  CC:  Chief Complaint  Patient presents with   Follow-up   Diabetes    HPI Jerome Barnett presents for follow up of hyperglycemia and GERD. Patient denies acute complaints or concerns.   Past Medical History:  Diagnosis Date   CHF (congestive heart failure) ([place])     Past Surgical History:  Procedure Laterality Date   CARDIAC CATHETERIZATION N/A 07/05/2015   Procedure: Right/Left Heart Cath and Coronary Angiography;  Surgeon: Jolaine Artist, MD;  Location: [facility];  Service: Cardiovascular;  Laterality: N/A;    Family History  Problem Relation Age of Onset   Hyperlipidemia Mother    Hypertension Father    Hyperlipidemia Father    Irritable bowel syndrome Father     Social History   Socioeconomic History   Marital status: Divorced    Spouse name: Not on file   Number of children: Not on file   Years of education: Not on file   Highest education level: Not on file  Occupational History   Not on file  Tobacco Use   Smoking status: Former    Packs/day: 2.00    Years: 20.00    Pack years: 40.00    Types: Cigarettes    Quit date: 03/20/2012    Years since quitting: 8.9   Smokeless tobacco: Never  Vaping Use   Vaping Use: Never used  Substance and Sexual Activity   Alcohol use: No    Alcohol/week: 0.0 standard drinks   Drug use: No   Sexual activity: Not on file  Other Topics Concern   Not on file  Social History Narrative   Patient is separated., no children.   Has pets   Works full time at [employer]    Social Determinants of Radio broadcast assistant Strain: Not on [employer] Insecurity: Not on file  Transportation Needs: Not on file  Physical Activity: Not on file  Stress: Not on file  Social Connections: Not on file  Intimate Partner Violence: Not on file    ROS Review of Systems  All other  systems reviewed and are negative.  Objective:   Today's Vitals: BP (!) 143/97   Pulse 84   Temp 97.6 F (36.4 C) (Oral)   Resp 16   Wt 187 lb (84.8 kg)   SpO2 95%   BMI 28.43 kg/m   Physical Exam Vitals and nursing note reviewed.  Constitutional:      General: He is not in acute distress. Cardiovascular:     Rate and Rhythm: Normal rate and regular rhythm.  Pulmonary:     Effort: Pulmonary effort is normal.     Breath sounds: Normal breath sounds.  Abdominal:     Palpations: Abdomen is soft.     Tenderness: There is no abdominal tenderness.  Neurological:     General: No focal deficit present.     Mental Status: He is alert and oriented to person, place, and time.    Assessment & Plan:   1. Prediabetes A1c stable and at goal. Continue present management and monitor. Meds refilled.   - metFORMIN (GLUCOPHAGE) 500 MG tablet; Take 1 tablet by mouth daily with breakfast.  Dispense: 90 tablet; Refill: 1 - POCT glycosylated hemoglobin (Hb A1C)  2. Gastroesophageal reflux disease without esophagitis Dietary and activity options discussed. Meds refilled.   -  pantoprazole (PROTONIX) 40 MG tablet; TAKE 1 TABLET BY MOUTH DAILY.  Dispense: 90 tablet; Refill: 1  3. Hyperlipidemia, unspecified hyperlipidemia type Continue present management. Meds refilled.   - atorvastatin (LIPITOR) 80 MG tablet; Take 1 tablet by mouth daily.  Dispense: 90 tablet; Refill: 1  4. Long-term use of aspirin therapy  - pantoprazole (PROTONIX) 40 MG tablet; TAKE 1 TABLET BY MOUTH DAILY.  Dispense: 90 tablet; Refill: 1  5. Screening for colon cancer Cologuard referral  - Cologuard    Outpatient Encounter Medications as of 02/23/2021  Medication Sig   aspirin 81 MG chewable tablet One pill by mouth daily   carvedilol (COREG) 12.5 MG tablet Take 1 tablet by mouth 2 times daily with a meal.   carvedilol (COREG) 6.25 MG tablet Take 1.5 tablets (9.375 mg total) by mouth 2 (two) times daily with a  meal.   Cetirizine HCl (ZYRTEC ALLERGY PO) Take 1 tablet by mouth daily.   sacubitril-valsartan (ENTRESTO) 24-26 MG TAKE 1 TABLET BY MOUTH 2 TIMES DAILY (NEEDS OFFICE VISIT)   spironolactone (ALDACTONE) 25 MG tablet Take 1 tablet (25 mg total) by mouth daily.   [DISCONTINUED] atorvastatin (LIPITOR) 80 MG tablet Take 1 tablet by mouth daily.   [DISCONTINUED] carvedilol (COREG) 6.25 MG tablet Take 6.25 mg by mouth 2 (two) times daily with a meal.   [DISCONTINUED] metFORMIN (GLUCOPHAGE) 500 MG tablet Take 1 tablet by mouth daily with breakfast.   [DISCONTINUED] pantoprazole (PROTONIX) 40 MG tablet TAKE 1 TABLET BY MOUTH DAILY.   atorvastatin (LIPITOR) 80 MG tablet Take 1 tablet by mouth daily.   metFORMIN (GLUCOPHAGE) 500 MG tablet Take 1 tablet by mouth daily with breakfast.   pantoprazole (PROTONIX) 40 MG tablet TAKE 1 TABLET BY MOUTH DAILY.   No facility-administered encounter medications on file as of 02/23/2021.    Follow-up: Return in about 6 months (around 08/24/2021) for follow up, physical.   Becky Sax, MD

## 2021-02-23 NOTE — Progress Notes (Signed)
Patient would like a cologuard ordered.

## 2021-02-24 ENCOUNTER — Encounter: Payer: Self-pay | Admitting: Family Medicine

## 2021-03-02 ENCOUNTER — Other Ambulatory Visit (HOSPITAL_COMMUNITY): Payer: Self-pay

## 2021-03-15 LAB — COLOGUARD

## 2021-03-23 ENCOUNTER — Other Ambulatory Visit (HOSPITAL_COMMUNITY): Payer: Self-pay

## 2021-04-18 LAB — COLOGUARD: COLOGUARD: NEGATIVE

## 2021-04-22 ENCOUNTER — Other Ambulatory Visit (HOSPITAL_COMMUNITY): Payer: Self-pay | Admitting: Internal Medicine

## 2021-04-22 DIAGNOSIS — I5020 Unspecified systolic (congestive) heart failure: Secondary | ICD-10-CM

## 2021-04-23 ENCOUNTER — Other Ambulatory Visit (HOSPITAL_COMMUNITY): Payer: Self-pay

## 2021-04-25 ENCOUNTER — Other Ambulatory Visit (HOSPITAL_COMMUNITY): Payer: Self-pay

## 2021-04-25 MED ORDER — ENTRESTO 24-26 MG PO TABS
ORAL_TABLET | ORAL | 2 refills | Status: DC
  Filled 2021-04-25: qty 60, 30d supply, fill #0
  Filled 2021-05-29: qty 60, 30d supply, fill #1
  Filled 2021-07-01: qty 60, 30d supply, fill #2

## 2021-05-30 ENCOUNTER — Other Ambulatory Visit (HOSPITAL_COMMUNITY): Payer: Self-pay

## 2021-06-17 ENCOUNTER — Other Ambulatory Visit (HOSPITAL_COMMUNITY): Payer: Self-pay

## 2021-07-01 ENCOUNTER — Other Ambulatory Visit (HOSPITAL_COMMUNITY): Payer: Self-pay | Admitting: Internal Medicine

## 2021-07-01 DIAGNOSIS — I5020 Unspecified systolic (congestive) heart failure: Secondary | ICD-10-CM

## 2021-07-01 DIAGNOSIS — I10 Essential (primary) hypertension: Secondary | ICD-10-CM

## 2021-07-02 ENCOUNTER — Other Ambulatory Visit (HOSPITAL_COMMUNITY): Payer: Self-pay

## 2021-07-04 ENCOUNTER — Other Ambulatory Visit (HOSPITAL_COMMUNITY): Payer: Self-pay

## 2021-07-04 MED ORDER — SPIRONOLACTONE 25 MG PO TABS
25.0000 mg | ORAL_TABLET | Freq: Every day | ORAL | 6 refills | Status: DC
  Filled 2021-07-04: qty 30, 30d supply, fill #0
  Filled 2021-08-02: qty 30, 30d supply, fill #1
  Filled 2021-08-31: qty 30, 30d supply, fill #2
  Filled 2021-10-02: qty 30, 30d supply, fill #3
  Filled 2021-11-02: qty 30, 30d supply, fill #4
  Filled 2021-11-29: qty 30, 30d supply, fill #5
  Filled 2021-12-27: qty 30, 30d supply, fill #6

## 2021-08-02 ENCOUNTER — Other Ambulatory Visit (HOSPITAL_COMMUNITY): Payer: Self-pay | Admitting: Internal Medicine

## 2021-08-02 DIAGNOSIS — I5020 Unspecified systolic (congestive) heart failure: Secondary | ICD-10-CM

## 2021-08-03 ENCOUNTER — Other Ambulatory Visit (HOSPITAL_COMMUNITY): Payer: Self-pay

## 2021-08-03 MED ORDER — ENTRESTO 24-26 MG PO TABS
ORAL_TABLET | ORAL | 2 refills | Status: DC
  Filled 2021-08-03: qty 60, 30d supply, fill #0
  Filled 2021-08-31: qty 60, 30d supply, fill #1
  Filled 2021-10-02: qty 60, 30d supply, fill #2

## 2021-08-16 ENCOUNTER — Ambulatory Visit: Payer: Self-pay

## 2021-08-16 ENCOUNTER — Encounter: Payer: Self-pay | Admitting: Family Medicine

## 2021-08-16 ENCOUNTER — Ambulatory Visit: Payer: No Typology Code available for payment source | Admitting: Family Medicine

## 2021-08-16 VITALS — BP 105/73 | HR 92 | Temp 98.0°F | Resp 16 | Wt 179.8 lb

## 2021-08-16 DIAGNOSIS — I5020 Unspecified systolic (congestive) heart failure: Secondary | ICD-10-CM

## 2021-08-16 DIAGNOSIS — R0602 Shortness of breath: Secondary | ICD-10-CM | POA: Diagnosis not present

## 2021-08-16 DIAGNOSIS — K219 Gastro-esophageal reflux disease without esophagitis: Secondary | ICD-10-CM | POA: Diagnosis not present

## 2021-08-16 NOTE — Progress Notes (Unsigned)
shortness of breath on exertion or while just sitting.  Patient also says that he feels better after eating food.  Patient said the SOB is worst when waking in the morning

## 2021-08-16 NOTE — Telephone Encounter (Signed)
  Chief Complaint: difficulty breathing - muscle pain Symptoms: ibid Frequency: 2 weeks Pertinent Negatives: Patient denies Chest pain, fluid retention Disposition: '[]'$ ED /'[]'$ Urgent Care (no appt availability in office) / '[x]'$ Appointment(In office/virtual)/ '[]'$  [facility]/ '[]'$ Home Care/ '[]'$ Refused Recommended Disposition /'[]'$ [facility]/ '[]'$  Follow-up with PCP Additional Notes: Pt has a hx of heart failure. Pt states that he has a feeling of chest pressure for 2 weeks. Pt states that it gets better when he moves around.   Reason for Disposition  [1] MILD difficulty breathing (e.g., minimal/no SOB at rest, SOB with walking, pulse <100) AND [2] NEW-onset or WORSE than normal  Answer Assessment - Initial Assessment Questions 1. RESPIRATORY STATUS: "Describe your breathing?" (e.g., wheezing, shortness of breath, unable to speak, severe coughing)      Chest pressure 2. ONSET: "When did this breathing problem begin?"      2 weeks 3. PATTERN "Does the difficult breathing come and go, or has it been constant since it started?"      constant 4. SEVERITY: "How bad is your breathing?" (e.g., mild, moderate, severe)    - MILD: No SOB at rest, mild SOB with walking, speaks normally in sentences, can lie down, no retractions, pulse < 100.    - MODERATE: SOB at rest, SOB with minimal exertion and prefers to sit, cannot lie down flat, speaks in phrases, mild retractions, audible wheezing, pulse 100-120.    - SEVERE: Very SOB at rest, speaks in single words, struggling to breathe, sitting hunched forward, retractions, pulse > 120      mild 5. RECURRENT SYMPTOM: "Have you had difficulty breathing before?" If Yes, ask: "When was the last time?" and "What happened that time?"      4 years ago -  Heart failure 6. CARDIAC HISTORY: "Do you have any history of heart disease?" (e.g., heart attack, angina, bypass surgery, angioplasty)      Heart failure 7. LUNG HISTORY: "Do you have any history of  lung disease?"  (e.g., pulmonary embolus, asthma, emphysema)     no 8. CAUSE: "What do you think is causing the breathing problem?"      Heart issue 9. OTHER SYMPTOMS: "Do you have any other symptoms? (e.g., dizziness, runny nose, cough, chest pain, fever)     Muscle pain 10. O2 SATURATION MONITOR:  "Do you use an oxygen saturation monitor (pulse oximeter) at home?" If Yes, "What is your reading (oxygen level) today?" "What is your usual oxygen saturation reading?" (e.g., 95%)       na 11. PREGNANCY: "Is there any chance you are pregnant?" "When was your last menstrual period?"       na 12. TRAVEL: "Have you traveled out of the country in the last month?" (e.g., travel history, exposures)       na  Protocols used: Breathing Difficulty-A-AH

## 2021-08-17 ENCOUNTER — Encounter: Payer: Self-pay | Admitting: Family Medicine

## 2021-08-17 NOTE — Progress Notes (Signed)
Established Patient Office Visit  Subjective    Patient ID: Jerome Barnett, male    DOB: 08-05-1970  Age: 51 y.o. MRN: [id number]  CC:  Chief Complaint  Patient presents with   Shortness of Breath   Muscle Pain    HPI Ky Barban with complaint of intermittent episodes of SOB that occur mostly in the morning or with exertion. Patient denies CP.    Outpatient Encounter Medications as of 08/16/2021  Medication Sig   aspirin 81 MG chewable tablet One pill by mouth daily   atorvastatin (LIPITOR) 80 MG tablet Take 1 tablet by mouth daily.   carvedilol (COREG) 12.5 MG tablet Take 1 tablet by mouth 2 times daily with a meal.   Cetirizine HCl (ZYRTEC ALLERGY PO) Take 1 tablet by mouth daily.   metFORMIN (GLUCOPHAGE) 500 MG tablet Take 1 tablet by mouth daily with breakfast.   pantoprazole (PROTONIX) 40 MG tablet TAKE 1 TABLET BY MOUTH DAILY.   sacubitril-valsartan (ENTRESTO) 24-26 MG TAKE 1 TABLET BY MOUTH 2 TIMES DAILY (NEEDS OFFICE VISIT)   spironolactone (ALDACTONE) 25 MG tablet Take 1 tablet by mouth daily.   carvedilol (COREG) 6.25 MG tablet Take 1.5 tablets (9.375 mg total) by mouth 2 (two) times daily with a meal. (Patient not taking: Reported on 08/16/2021)   No facility-administered encounter medications on file as of 08/16/2021.    Past Medical History:  Diagnosis Date   CHF (congestive heart failure) ([place])     Past Surgical History:  Procedure Laterality Date   CARDIAC CATHETERIZATION N/A 07/05/2015   Procedure: Right/Left Heart Cath and Coronary Angiography;  Surgeon: Jolaine Artist, MD;  Location: [facility];  Service: Cardiovascular;  Laterality: N/A;    Family History  Problem Relation Age of Onset   Hyperlipidemia Mother    Hypertension Father    Hyperlipidemia Father    Irritable bowel syndrome Father     Social History   Socioeconomic History   Marital status: Divorced    Spouse name: Not on file   Number of children: Not on  file   Years of education: Not on file   Highest education level: Not on file  Occupational History   Not on file  Tobacco Use   Smoking status: Former    Packs/day: 2.00    Years: 20.00    Pack years: 40.00    Types: Cigarettes    Quit date: 03/20/2012    Years since quitting: 9.4   Smokeless tobacco: Never  Vaping Use   Vaping Use: Never used  Substance and Sexual Activity   Alcohol use: No    Alcohol/week: 0.0 standard drinks   Drug use: No   Sexual activity: Not on file  Other Topics Concern   Not on file  Social History Narrative   Patient is separated., no children.   Has pets   Works full time at [employer]    Social Determinants of Radio broadcast assistant Strain: Not on [employer] Insecurity: Not on file  Transportation Needs: Not on file  Physical Activity: Not on file  Stress: Not on file  Social Connections: Not on file  Intimate Partner Violence: Not on file    Review of Systems  Respiratory:  Positive for shortness of breath. Negative for wheezing.   Cardiovascular:  Negative for chest pain.  All other systems reviewed and are negative.      Objective    BP 105/73   Pulse 92  Temp 98 F (36.7 C) (Oral)   Resp 16   Wt 179 lb 12.8 oz (81.6 kg)   SpO2 96%   BMI 27.34 kg/m   Physical Exam Vitals and nursing note reviewed.  Constitutional:      General: He is not in acute distress. Cardiovascular:     Rate and Rhythm: Normal rate and regular rhythm.  Pulmonary:     Effort: Pulmonary effort is normal.     Breath sounds: Normal breath sounds.  Abdominal:     Palpations: Abdomen is soft.     Tenderness: There is no abdominal tenderness.  Musculoskeletal:     Right lower leg: No edema.     Left lower leg: No edema.  Neurological:     General: No focal deficit present.     Mental Status: He is alert and oriented to person, place, and time.        Assessment & Plan:   1. Heart failure with reduced ejection fraction, NYHA  class II ([place]) Referral to specialist for continued eval/mgt - Ambulatory referral to Cardiology  2. SOB (shortness of breath) on exertion ? Worsening CHF vs GERD. Referral as above - Ambulatory referral to Cardiology  3. Gastroesophageal reflux disease without esophagitis Reviewed activity and dietary options. Continue present management    No follow-ups on file.   Becky Sax, MD

## 2021-08-29 ENCOUNTER — Ambulatory Visit: Payer: No Typology Code available for payment source | Admitting: Family Medicine

## 2021-08-31 ENCOUNTER — Other Ambulatory Visit: Payer: Self-pay | Admitting: Family Medicine

## 2021-08-31 DIAGNOSIS — Z7982 Long term (current) use of aspirin: Secondary | ICD-10-CM

## 2021-08-31 DIAGNOSIS — K219 Gastro-esophageal reflux disease without esophagitis: Secondary | ICD-10-CM

## 2021-08-31 DIAGNOSIS — R7303 Prediabetes: Secondary | ICD-10-CM

## 2021-09-01 ENCOUNTER — Other Ambulatory Visit (HOSPITAL_COMMUNITY): Payer: Self-pay

## 2021-09-01 MED ORDER — PANTOPRAZOLE SODIUM 40 MG PO TBEC
DELAYED_RELEASE_TABLET | Freq: Every day | ORAL | 0 refills | Status: DC
  Filled 2021-09-01: qty 90, 90d supply, fill #0

## 2021-09-01 MED ORDER — METFORMIN HCL 500 MG PO TABS
500.0000 mg | ORAL_TABLET | Freq: Every day | ORAL | 0 refills | Status: DC
  Filled 2021-09-01: qty 90, 90d supply, fill #0

## 2021-09-01 NOTE — Telephone Encounter (Signed)
Refilled per patient request. 

## 2021-09-05 ENCOUNTER — Ambulatory Visit (HOSPITAL_COMMUNITY)
Admission: RE | Admit: 2021-09-05 | Discharge: 2021-09-05 | Disposition: A | Discharge status: Home / Self Care | Payer: No Typology Code available for payment source | Source: Ambulatory Visit | Attending: Family Medicine | Admitting: Family Medicine

## 2021-09-05 ENCOUNTER — Encounter (HOSPITAL_COMMUNITY): Payer: Self-pay

## 2021-09-05 ENCOUNTER — Other Ambulatory Visit (HOSPITAL_COMMUNITY): Payer: Self-pay | Admitting: Family Medicine

## 2021-09-05 ENCOUNTER — Other Ambulatory Visit (HOSPITAL_COMMUNITY): Payer: Self-pay

## 2021-09-05 VITALS — BP 122/84 | HR 90 | Wt 183.6 lb

## 2021-09-05 DIAGNOSIS — M6281 Muscle weakness (generalized): Secondary | ICD-10-CM | POA: Insufficient documentation

## 2021-09-05 DIAGNOSIS — I5020 Unspecified systolic (congestive) heart failure: Secondary | ICD-10-CM

## 2021-09-05 DIAGNOSIS — R1110 Vomiting, unspecified: Secondary | ICD-10-CM | POA: Insufficient documentation

## 2021-09-05 DIAGNOSIS — E1122 Type 2 diabetes mellitus with diabetic chronic kidney disease: Secondary | ICD-10-CM | POA: Diagnosis not present

## 2021-09-05 DIAGNOSIS — M25512 Pain in left shoulder: Secondary | ICD-10-CM | POA: Insufficient documentation

## 2021-09-05 DIAGNOSIS — Z7984 Long term (current) use of oral hypoglycemic drugs: Secondary | ICD-10-CM | POA: Insufficient documentation

## 2021-09-05 DIAGNOSIS — R002 Palpitations: Secondary | ICD-10-CM | POA: Diagnosis not present

## 2021-09-05 DIAGNOSIS — Z7901 Long term (current) use of anticoagulants: Secondary | ICD-10-CM | POA: Diagnosis not present

## 2021-09-05 DIAGNOSIS — R079 Chest pain, unspecified: Secondary | ICD-10-CM

## 2021-09-05 DIAGNOSIS — E785 Hyperlipidemia, unspecified: Secondary | ICD-10-CM | POA: Diagnosis not present

## 2021-09-05 DIAGNOSIS — I5022 Chronic systolic (congestive) heart failure: Secondary | ICD-10-CM | POA: Insufficient documentation

## 2021-09-05 DIAGNOSIS — I10 Essential (primary) hypertension: Secondary | ICD-10-CM | POA: Diagnosis not present

## 2021-09-05 DIAGNOSIS — Z79899 Other long term (current) drug therapy: Secondary | ICD-10-CM | POA: Diagnosis not present

## 2021-09-05 DIAGNOSIS — R0789 Other chest pain: Secondary | ICD-10-CM | POA: Diagnosis not present

## 2021-09-05 DIAGNOSIS — I110 Hypertensive heart disease with heart failure: Secondary | ICD-10-CM | POA: Diagnosis not present

## 2021-09-05 DIAGNOSIS — R0602 Shortness of breath: Secondary | ICD-10-CM | POA: Diagnosis present

## 2021-09-05 DIAGNOSIS — E119 Type 2 diabetes mellitus without complications: Secondary | ICD-10-CM

## 2021-09-05 LAB — COMPREHENSIVE METABOLIC PANEL
ALT: 34 U/L (ref 0–44)
AST: 20 U/L (ref 15–41)
Albumin: 4.2 g/dL (ref 3.5–5.0)
Alkaline Phosphatase: 48 U/L (ref 38–126)
Anion gap: 9 (ref 5–15)
BUN: 13 mg/dL (ref 6–20)
CO2: 25 mmol/L (ref 22–32)
Calcium: 9.5 mg/dL (ref 8.9–10.3)
Chloride: 105 mmol/L (ref 98–111)
Creatinine, Ser: 0.95 mg/dL (ref 0.61–1.24)
GFR, Estimated: >60 mL/min (ref >60)
Glucose, Bld: 120 mg/dL — ABNORMAL HIGH (ref 70–99)
Potassium: 3.8 mmol/L (ref 3.5–5.1)
Sodium: 139 mmol/L (ref 135–145)
Total Bilirubin: 1 mg/dL (ref 0.3–1.2)
Total Protein: 7.4 g/dL (ref 6.5–8.1)

## 2021-09-05 LAB — HEMOGLOBIN A1C
Hgb A1c MFr Bld: 5.7 % — ABNORMAL HIGH (ref 4.8–5.6)
Mean Plasma Glucose: 116.89 mg/dL

## 2021-09-05 LAB — BRAIN NATRIURETIC PEPTIDE: B Natriuretic Peptide: 10.4 pg/mL (ref 0.0–100.0)

## 2021-09-05 LAB — CK: Total CK: 104 U/L (ref 49–397)

## 2021-09-05 MED ORDER — DAPAGLIFLOZIN PROPANEDIOL 10 MG PO TABS
10.0000 mg | ORAL_TABLET | Freq: Every day | ORAL | 2 refills | Status: DC
  Filled 2021-09-05: qty 90, 90d supply, fill #0

## 2021-09-05 MED ORDER — DAPAGLIFLOZIN PROPANEDIOL 10 MG PO TABS
10.0000 mg | ORAL_TABLET | Freq: Every day | ORAL | 6 refills | Status: DC
  Filled 2021-09-05: qty 30, 30d supply, fill #0

## 2021-09-05 NOTE — Patient Instructions (Signed)
Thank you for coming in today  Labs were done today, if any labs are abnormal the clinic will call you No news is good news  START Farxiga 10 mg 1 tablet daily  Your physician recommends that you schedule a follow-up appointment in:  3-4 weeks in clinic 3 months with Dr. Haroldine Laws  Your physician has requested that you have an echocardiogram. Echocardiography is a painless test that uses sound waves to create images of your heart. It provides your doctor with information about the size and shape of your heart and how well your heart's chambers and valves are working. This procedure takes approximately one hour. There are no restrictions for this procedure.   At the [facility], you and your health needs are our priority. As part of our continuing mission to provide you with exceptional heart care, we have created designated Provider Care Teams. These Care Teams include your primary Cardiologist (physician) and Advanced Practice Providers (APPs- Physician Assistants and Nurse Practitioners) who all work together to provide you with the care you need, when you need it.   You may see any of the following providers on your designated Care Team at your next follow up: Dr Glori Bickers Dr Haynes Kerns, NP Lyda Jester, [place] [facility] [place], [place] Audry Riles, PharmD   Please be sure to bring in all your medications bottles to every appointment.   If you have any questions or concerns before your next appointment please send [place] a message through [place] or call our office at [phone].    TO LEAVE A MESSAGE FOR THE NURSE SELECT OPTION 2, PLEASE LEAVE A MESSAGE INCLUDING: YOUR NAME DATE OF BIRTH CALL BACK NUMBER REASON FOR CALL**this is important as we prioritize the call backs  YOU WILL RECEIVE A CALL BACK THE SAME DAY AS LONG AS YOU CALL BEFORE 4:00 PM

## 2021-09-05 NOTE — Progress Notes (Signed)
Patient ID: Jerome Barnett, male   DOB: 08/05/1970, 51 y.o.   MRN: [id number]    [facility]  NOTE  PCP: Dorna Mai, MD HF MD: Dr. Haroldine Laws   HPI: Jerome Barnett is a 51 y.o. male with h/o HL, HTN and GERD who was admitted in 4/17 with acute HF. EF 15%.  Underwent cath in 4/17 normal cors with low filling pressures and normal cardiac output. Procedure complicated by radial artery spasm requiring multiple rounds of verapamil and NTG. Patient then developed shock and was supported with norepi and milrinone transiently. Was discharged home and then readmitted several days later with hypotension in setting of volume depletion due to severe restriction of po intake.    Seen in ED 08/03/15 and found to be orthostatic. Symptoms improved with 500 cc of IVF.   Admitted 10/22/17 with increased dyspnea. Echo performed and showed EF 45-50% (improved) . Troponin was negative and BNP was not elevated so he was discharged   Was seen in HF Clinic 8/16 with palpitations and dizziness. Around that time was getting separated from his wife and had to put dog down.   Zio patch 9/19 1. Sinus rhythm with rare PACs and PVCs. 2. No significant arrhythmias 3. Diary events of dizziness occasionaly correspond to PVCs but also triggered with NSR  Follow up 8/22, NYHA I, volume good. Repeat echo ordered.  Today he returns for acute visit with complaints of SOB and muscle pain for past couple months. He has stopped going to gym and chiropractor. Having left shoulder pain. Occasional atypical chest pain at rest , lasts 5-10 minutes. Had an instance of CP relieved after vomiting. Unclear if related to reflux. Feeling palps at night. SOB sitting down and bending over, but none with walking or going up stairs. Denies abnormal bleeding, dizziness, edema, or PND/Orthopnea. Appetite ok. No fever or chills. Weight at home 180-185 pounds. Taking all medications.     Cardiac Studies  - Echo (8/19): EF 45-50%,  diffuse HK - Echo (4/17): EF 10-15%, normal RV - Echo 10/17): EF ~40% normal RV  - cMRI  (3/18): LVEF 43% RVEF 41% NICM  - RHC/LHC (4/17):  RA = 2 RV = 28/0/4 PA = 28/11 (18) PCW = 5 Fick cardiac output/index = 4.3/2.3 PVR = 3.0 WU FA sat = 94% PA sat = 60%, 63%  1. Normal coronary arteries 2. Low filling pressures with normal cardiac output 3. Severe NICM with EF 10% by echo 3. Development of severe hypotension and shock in cath lab due to vasodilators to treat radial artery spasm  ROS: All systems reviewed and negative except as per HPI.  Current Outpatient Medications  Medication Sig Dispense Refill   aspirin 81 MG chewable tablet One pill by mouth daily 30 tablet 11   atorvastatin (LIPITOR) 80 MG tablet Take 1 tablet by mouth daily. 90 tablet 1   carvedilol (COREG) 12.5 MG tablet Take 1 tablet by mouth 2 times daily with a meal. 180 tablet 1   Cetirizine HCl (ZYRTEC ALLERGY PO) Take 1 tablet by mouth daily as needed.     metFORMIN (GLUCOPHAGE) 500 MG tablet Take 1 tablet (500 mg total) by mouth daily with breakfast. 90 tablet 0   pantoprazole (PROTONIX) 40 MG tablet TAKE 1 TABLET BY MOUTH DAILY. 90 tablet 0   sacubitril-valsartan (ENTRESTO) 24-26 MG TAKE 1 TABLET BY MOUTH 2 TIMES DAILY (NEEDS OFFICE VISIT) 60 tablet 2   spironolactone (ALDACTONE) 25 MG tablet Take 1 tablet by mouth  daily. 30 tablet 6   No current facility-administered medications for this encounter.   Allergies  Allergen Reactions   Amoxicillin Swelling    Eye swelling   Lasix [Furosemide] Swelling    Eye swelling   Social History   Socioeconomic History   Marital status: Divorced    Spouse name: Not on file   Number of children: Not on file   Years of education: Not on file   Highest education level: Not on file  Occupational History   Not on file  Tobacco Use   Smoking status: Former    Packs/day: 2.00    Years: 20.00    Total pack years: 40.00    Types: Cigarettes    Quit date:  03/20/2012    Years since quitting: 9.4   Smokeless tobacco: Never  Vaping Use   Vaping Use: Never used  Substance and Sexual Activity   Alcohol use: No    Alcohol/week: 0.0 standard drinks of alcohol   Drug use: No   Sexual activity: Not on file  Other Topics Concern   Not on file  Social History Narrative   Patient is separated., no children.   Has pets   Works full time at [employer]    Social Determinants of Radio broadcast assistant Strain: Not on file  Food Insecurity: Not on file  Transportation Needs: Not on file  Physical Activity: Not on file  Stress: Not on file  Social Connections: Not on file  Intimate Partner Violence: Not on file   Family History  Problem Relation Age of Onset   Hyperlipidemia Mother    Hypertension Father    Hyperlipidemia Father    Irritable bowel syndrome Father    BP 122/84   Pulse 90   Wt 83.3 kg (183 lb 9.6 oz)   SpO2 96%   BMI 27.92 kg/m   Wt Readings from Last 3 Encounters:  09/05/21 83.3 kg (183 lb 9.6 oz)  08/16/21 81.6 kg (179 lb 12.8 oz)  02/23/21 84.8 kg (187 lb)    PHYSICAL EXAM: General:  NAD. No resp difficulty HEENT: Normal Neck: Supple. JVP 7-8. Carotids 2+ bilat; no bruits. No lymphadenopathy or thryomegaly appreciated. Cor: PMI nondisplaced. Regular rate & rhythm. No rubs, gallops or murmurs. Lungs: Clear Abdomen: Soft, nontender, nondistended. No hepatosplenomegaly. No bruits or masses. Good bowel sounds. Extremities: No cyanosis, clubbing, rash, edema Neuro: Alert & oriented x 3, cranial nerves grossly intact. Moves all 4 extremities w/o difficulty. Affect pleasant.  ReDs: 38%  ECG (personally reviewed): NSR 80 bpm, LVH  ASSESSMENT & PLAN: 1. Chronic systolic heart failure  - cath (4/17): with normal coronaries. EF 15%. ? Viral CM. - Echo (10/17): with improvement of EF ~40%.  - Bedside Echo (2/18): EF 25-30% range.  - cMRI (3/18) LVEF 43% RV normal  - Echo (8/19): EF 45-50%  - Echo (2/20):  EF 55-60% RV mild HK  - Repeat echo to ensure EF stability. - NYHA II, volume mildly up, REDs 38%. - Start Farxiga 10 mg daily. - Continue spiro 25 mg daily.  - Continue Entresto 24/26 mg bid.    - Continue carvedilol 12.5 mg bid. - BMET and BNP today.  2. HLD - Cont atorvastatin 80 mg. PCP following - Last LDL in 3/22 was 63  - With muscle weakness, check LFTs and CK.  3. HTN - Blood pressure well controlled.  - Continue current regimen.  4. DM2 - on metformin. Followed by PCP -  Start SGLT2i.  - Check A1C today.  5. Palpitations - Diurese as above. - Consider repeating Zio next visit if persists.  6. Chest pain, atypical - Cath (4/17) with no CAD - Now with occasional episodes, may be related to volume. - He had one recent episode relieved with vomiting. ? GERD - If persists after diuresing, will need to consider stress test vs cath as he has CRF (DM2, HTN, HLD).  Follow up w/ APP in 3 weeks and as scheduled with Dr. Haroldine Laws in August.  Rafael Bihari, FNP  9:28 AM

## 2021-09-05 NOTE — Telephone Encounter (Signed)
Message to provider Janett Billow ok to change to jardiance? Insurance prefers over [employer]

## 2021-09-05 NOTE — Progress Notes (Signed)
ReDS Vest / Clip - 09/05/21 0900       ReDS Vest / Clip   Station Marker D    Ruler Value 25.5    ReDS Value Range Moderate volume overload    ReDS Actual Value 38

## 2021-09-07 ENCOUNTER — Other Ambulatory Visit (HOSPITAL_COMMUNITY): Payer: Self-pay

## 2021-09-07 MED ORDER — EMPAGLIFLOZIN 10 MG PO TABS
10.0000 mg | ORAL_TABLET | Freq: Every day | ORAL | 11 refills | Status: DC
  Filled 2021-09-07: qty 30, 30d supply, fill #0
  Filled 2021-10-02: qty 30, 30d supply, fill #1
  Filled 2021-11-02: qty 30, 30d supply, fill #2
  Filled 2021-12-07: qty 30, 30d supply, fill #3
  Filled 2022-01-09: qty 30, 30d supply, fill #4
  Filled 2022-02-07: qty 30, 30d supply, fill #5
  Filled 2022-03-09: qty 30, 30d supply, fill #6
  Filled 2022-04-11: qty 30, 30d supply, fill #7
  Filled 2022-05-10: qty 30, 30d supply, fill #8
  Filled 2022-06-08: qty 30, 30d supply, fill #9
  Filled 2022-07-06: qty 30, 30d supply, fill #10
  Filled 2022-08-16: qty 30, 30d supply, fill #11

## 2021-09-07 NOTE — Telephone Encounter (Signed)
Done

## 2021-09-14 ENCOUNTER — Ambulatory Visit (HOSPITAL_COMMUNITY)
Admission: RE | Admit: 2021-09-14 | Discharge: 2021-09-14 | Disposition: A | Discharge status: Home / Self Care | Payer: No Typology Code available for payment source | Source: Ambulatory Visit | Attending: Family Medicine | Admitting: Family Medicine

## 2021-09-14 DIAGNOSIS — I110 Hypertensive heart disease with heart failure: Secondary | ICD-10-CM | POA: Insufficient documentation

## 2021-09-14 DIAGNOSIS — I5022 Chronic systolic (congestive) heart failure: Secondary | ICD-10-CM | POA: Diagnosis not present

## 2021-09-14 DIAGNOSIS — Z8673 Personal history of transient ischemic attack (TIA), and cerebral infarction without residual deficits: Secondary | ICD-10-CM | POA: Insufficient documentation

## 2021-09-14 DIAGNOSIS — I351 Nonrheumatic aortic (valve) insufficiency: Secondary | ICD-10-CM | POA: Diagnosis not present

## 2021-09-14 DIAGNOSIS — E785 Hyperlipidemia, unspecified: Secondary | ICD-10-CM | POA: Insufficient documentation

## 2021-09-14 LAB — ECHOCARDIOGRAM COMPLETE
Area-P 1/2: 3.53 cm2
Calc EF: 55.1 %
P 1/2 time: 482 msec
S' Lateral: 3.7 cm
Single Plane A2C EF: 54.6 %
Single Plane A4C EF: 54.6 %

## 2021-09-14 NOTE — Progress Notes (Signed)
  Echocardiogram 2D Echocardiogram has been performed.  Jerome Barnett 09/14/2021, 8:43 AM

## 2021-09-19 ENCOUNTER — Other Ambulatory Visit: Payer: Self-pay | Admitting: Family Medicine

## 2021-09-19 DIAGNOSIS — E785 Hyperlipidemia, unspecified: Secondary | ICD-10-CM

## 2021-09-21 ENCOUNTER — Other Ambulatory Visit (HOSPITAL_COMMUNITY): Payer: Self-pay

## 2021-09-21 MED ORDER — ATORVASTATIN CALCIUM 80 MG PO TABS
80.0000 mg | ORAL_TABLET | Freq: Every day | ORAL | 0 refills | Status: DC
  Filled 2021-09-21: qty 90, 90d supply, fill #0

## 2021-09-21 NOTE — Telephone Encounter (Signed)
Requested Prescriptions  Pending Prescriptions Disp Refills  . atorvastatin (LIPITOR) 80 MG tablet 90 tablet 0    Sig: Take 1 tablet by mouth daily.     Cardiovascular:  Antilipid - Statins Failed - 09/19/2021 11:00 PM      Failed - Lipid Panel in normal range within the last 12 months    Cholesterol, Total  Date Value Ref Range Status  06/07/2020 127 100 - 199 mg/dL Final   LDL Chol Calc (NIH)  Date Value Ref Range Status  06/07/2020 63 0 - 99 mg/dL Final   HDL  Date Value Ref Range Status  06/07/2020 45 >39 mg/dL Final   Triglycerides  Date Value Ref Range Status  06/07/2020 105 0 - 149 mg/dL Final         Passed - Patient is not pregnant      Passed - Valid encounter within last 12 months    Recent Outpatient Visits          1 month ago Heart failure with reduced ejection fraction, NYHA class II ([place])   Primary Care at [facility], MD   7 months ago Prediabetes   Primary Care at [facility], MD   1 year ago Encounter for annual physical exam   Primary Care at [facility], Bayard Beaver, MD   1 year ago Prediabetes   Primary Care at [facility], Dionne Bucy, [place]   2 years ago Essential hypertension   Primary Care at [facility], MD      Future Appointments            In 2 days Dorna Mai, MD Primary Care at [facility]

## 2021-09-23 ENCOUNTER — Ambulatory Visit: Payer: No Typology Code available for payment source | Admitting: Family Medicine

## 2021-09-23 ENCOUNTER — Encounter: Payer: Self-pay | Admitting: Family Medicine

## 2021-09-23 VITALS — BP 117/78 | HR 78 | Temp 97.7°F | Resp 16 | Wt 183.9 lb

## 2021-09-23 DIAGNOSIS — M542 Cervicalgia: Secondary | ICD-10-CM | POA: Diagnosis not present

## 2021-09-23 DIAGNOSIS — M79601 Pain in right arm: Secondary | ICD-10-CM

## 2021-09-23 DIAGNOSIS — M792 Neuralgia and neuritis, unspecified: Secondary | ICD-10-CM | POA: Diagnosis not present

## 2021-09-23 MED ORDER — TRIAMCINOLONE ACETONIDE 40 MG/ML IJ SUSP
40.0000 mg | Freq: Once | INTRAMUSCULAR | Status: AC
  Administered 2021-09-23: 40 mg via INTRAMUSCULAR

## 2021-09-23 NOTE — Progress Notes (Signed)
Established Patient Office Visit  Subjective    Patient ID: Jerome Barnett, male    DOB: 10-01-1970  Age: 51 y.o. MRN: [id number]  CC:  Chief Complaint  Patient presents with   Numbness   Diabetes    HPI Jerome Barnett presents with complaint of right arm numbness. Symptoms are intermittent. Denies known trauma or injury.    Outpatient Encounter Medications as of 09/23/2021  Medication Sig   aspirin 81 MG chewable tablet One pill by mouth daily   atorvastatin (LIPITOR) 80 MG tablet Take 1 tablet by mouth daily.   carvedilol (COREG) 12.5 MG tablet Take 1 tablet by mouth 2 times daily with a meal.   Cetirizine HCl (ZYRTEC ALLERGY PO) Take 1 tablet by mouth daily as needed.   empagliflozin (JARDIANCE) 10 MG TABS tablet Take 1 tablet (10 mg total) by mouth daily before breakfast.   metFORMIN (GLUCOPHAGE) 500 MG tablet Take 1 tablet (500 mg total) by mouth daily with breakfast.   pantoprazole (PROTONIX) 40 MG tablet TAKE 1 TABLET BY MOUTH DAILY.   sacubitril-valsartan (ENTRESTO) 24-26 MG TAKE 1 TABLET BY MOUTH 2 TIMES DAILY (NEEDS OFFICE VISIT)   spironolactone (ALDACTONE) 25 MG tablet Take 1 tablet by mouth daily.   [EXPIRED] triamcinolone acetonide (KENALOG-40) injection 40 mg    No facility-administered encounter medications on file as of 09/23/2021.    Past Medical History:  Diagnosis Date   CHF (congestive heart failure) ([place])     Past Surgical History:  Procedure Laterality Date   CARDIAC CATHETERIZATION N/A 07/05/2015   Procedure: Right/Left Heart Cath and Coronary Angiography;  Surgeon: Jolaine Artist, MD;  Location: [place] [facility];  Service: Cardiovascular;  Laterality: N/A;    Family History  Problem Relation Age of Onset   Hyperlipidemia Mother    Hypertension Father    Hyperlipidemia Father    Irritable bowel syndrome Father     Social History   Socioeconomic History   Marital status: Divorced    Spouse name: Not on file   Number of  children: Not on file   Years of education: Not on file   Highest education level: Not on file  Occupational History   Not on file  Tobacco Use   Smoking status: Former    Packs/day: 2.00    Years: 20.00    Total pack years: 40.00    Types: Cigarettes    Quit date: 03/20/2012    Years since quitting: 9.5   Smokeless tobacco: Never  Vaping Use   Vaping Use: Never used  Substance and Sexual Activity   Alcohol use: No    Alcohol/week: 0.0 standard drinks of alcohol   Drug use: No   Sexual activity: Not on file  Other Topics Concern   Not on file  Social History Narrative   Patient is separated., no children.   Has pets   Works full time at [employer]    Social Determinants of Radio broadcast assistant Strain: Not on [employer] Insecurity: Not on file  Transportation Needs: Not on file  Physical Activity: Not on file  Stress: Not on file  Social Connections: Not on file  Intimate Partner Violence: Not on file    Review of Systems  All other systems reviewed and are negative.       Objective    BP 117/78   Pulse 78   Temp 97.7 F (36.5 C) (Oral)   Resp 16   Wt 183 lb  14.4 oz (83.4 kg)   SpO2 97%   BMI 27.96 kg/m   Physical Exam Vitals and nursing note reviewed.  Constitutional:      General: He is not in acute distress. Cardiovascular:     Rate and Rhythm: Normal rate and regular rhythm.  Pulmonary:     Effort: Pulmonary effort is normal.     Breath sounds: Normal breath sounds.  Musculoskeletal:     Cervical back: No signs of trauma or rigidity. Muscular tenderness present. Normal range of motion.     Comments: Right upper extremity and shoulder are unremarkable  Neurological:     General: No focal deficit present.     Mental Status: He is alert and oriented to person, place, and time.         Assessment & Plan:   1. Right arm pain Kenalog IM injection given - triamcinolone acetonide (KENALOG-40) injection 40 mg  2. Neck pain As  above. Recommend exercises and possible trying alternate pillows - triamcinolone acetonide (KENALOG-40) injection 40 mg  3. Neuralgia As above. Patient defers oral agent - triamcinolone acetonide (KENALOG-40) injection 40 mg    Return in about 6 weeks (around 11/04/2021) for follow up.   Becky Sax, MD

## 2021-09-23 NOTE — Progress Notes (Signed)
Patient c/o arm numbness. Patient said that has been going on for awhile.  Patient given triamcinolone Acetonide 40 mg

## 2021-10-02 ENCOUNTER — Other Ambulatory Visit: Payer: Self-pay | Admitting: Family Medicine

## 2021-10-03 ENCOUNTER — Ambulatory Visit (HOSPITAL_COMMUNITY)
Admission: RE | Admit: 2021-10-03 | Discharge: 2021-10-03 | Disposition: A | Discharge status: Home / Self Care | Payer: No Typology Code available for payment source | Source: Ambulatory Visit | Attending: Family Medicine | Admitting: Family Medicine

## 2021-10-03 ENCOUNTER — Other Ambulatory Visit (HOSPITAL_COMMUNITY): Payer: Self-pay

## 2021-10-03 ENCOUNTER — Encounter (HOSPITAL_COMMUNITY): Payer: Self-pay

## 2021-10-03 VITALS — BP 119/70 | HR 82 | Wt 183.6 lb

## 2021-10-03 DIAGNOSIS — I10 Essential (primary) hypertension: Secondary | ICD-10-CM

## 2021-10-03 DIAGNOSIS — I5032 Chronic diastolic (congestive) heart failure: Secondary | ICD-10-CM | POA: Diagnosis not present

## 2021-10-03 DIAGNOSIS — R0789 Other chest pain: Secondary | ICD-10-CM | POA: Diagnosis present

## 2021-10-03 DIAGNOSIS — I5022 Chronic systolic (congestive) heart failure: Secondary | ICD-10-CM

## 2021-10-03 DIAGNOSIS — Z7182 Exercise counseling: Secondary | ICD-10-CM | POA: Diagnosis not present

## 2021-10-03 DIAGNOSIS — Z79899 Other long term (current) drug therapy: Secondary | ICD-10-CM | POA: Insufficient documentation

## 2021-10-03 DIAGNOSIS — I110 Hypertensive heart disease with heart failure: Secondary | ICD-10-CM | POA: Diagnosis not present

## 2021-10-03 DIAGNOSIS — K219 Gastro-esophageal reflux disease without esophagitis: Secondary | ICD-10-CM | POA: Diagnosis not present

## 2021-10-03 DIAGNOSIS — R079 Chest pain, unspecified: Secondary | ICD-10-CM | POA: Diagnosis not present

## 2021-10-03 DIAGNOSIS — Z7984 Long term (current) use of oral hypoglycemic drugs: Secondary | ICD-10-CM | POA: Insufficient documentation

## 2021-10-03 DIAGNOSIS — E119 Type 2 diabetes mellitus without complications: Secondary | ICD-10-CM

## 2021-10-03 DIAGNOSIS — E785 Hyperlipidemia, unspecified: Secondary | ICD-10-CM

## 2021-10-03 NOTE — Patient Instructions (Addendum)
Thank you for coming in today  No lab today  EKG was done today  You will be called and scheduled for a cardiac exercise stress test.    Do the following things EVERYDAY: Weigh yourself in the morning before breakfast. Write it down and keep it in a log. Take your medicines as prescribed Eat low salt foods--Limit salt (sodium) to 2000 mg per day.  Stay as active as you can everyday Limit all fluids for the day to less than 2 liters  At the [facility], you and your health needs are our priority. As part of our continuing mission to provide you with exceptional heart care, we have created designated Provider Care Teams. These Care Teams include your primary Cardiologist (physician) and Advanced Practice Providers (APPs- Physician Assistants and Nurse Practitioners) who all work together to provide you with the care you need, when you need it.   You may see any of the following providers on your designated Care Team at your next follow up: Dr Glori Bickers Dr Haynes Kerns, NP Lyda Jester, [place] [facility] [place], [place] Audry Riles, PharmD   Please be sure to bring in all your medications bottles to every appointment.   If you have any questions or concerns before your next appointment please send [place] a message through [place] or call our office at [phone].    TO LEAVE A MESSAGE FOR THE NURSE SELECT OPTION 2, PLEASE LEAVE A MESSAGE INCLUDING: YOUR NAME DATE OF BIRTH CALL BACK NUMBER REASON FOR CALL**this is important as we prioritize the call backs  YOU WILL RECEIVE A CALL BACK THE SAME DAY AS LONG AS YOU CALL BEFORE 4:00 PM

## 2021-10-03 NOTE — Telephone Encounter (Signed)
Requested Prescriptions   Pending Prescriptions Disp Refills   carvedilol (COREG) 12.5 MG tablet 180 tablet 1    Sig: Take 1 tablet by mouth 2 times daily with a meal.

## 2021-10-03 NOTE — Progress Notes (Signed)
Patient ID: Jerome Barnett, male   DOB: October 21, 1970, 51 y.o.   MRN: [id number]    ADVANCED [facility] CLINIC  NOTE  PCP: Dorna Mai, MD HF MD: Dr. Haroldine Laws   HPI: Jerome Barnett is a 51 y.o. male with h/o HL, HTN and GERD who was admitted in 4/17 with acute HF. EF 15%.  Underwent cath in 4/17 normal cors with low filling pressures and normal cardiac output. Procedure complicated by radial artery spasm requiring multiple rounds of verapamil and NTG. Patient then developed shock and was supported with norepi and milrinone transiently. Was discharged home and then readmitted several days later with hypotension in setting of volume depletion due to severe restriction of po intake.    Seen in ED 08/03/15 and found to be orthostatic. Symptoms improved with 500 cc of IVF.   Admitted 10/22/17 with increased dyspnea. Echo performed and showed EF 45-50% (improved) . Troponin was negative and BNP was not elevated so he was discharged   Was seen in HF Clinic 8/16 with palpitations and dizziness. Around that time was getting separated from his wife and had to put dog down.   Zio patch 9/19 1. Sinus rhythm with rare PACs and PVCs. 2. No significant arrhythmias 3. Diary events of dizziness occasionaly correspond to PVCs but also triggered with NSR  Follow up 8/22, NYHA I, volume good. Repeat echo ordered.  Acute visit 6/23 with SOB and atypical chest pain. SGLT2i started and echo repeated, showing EF stable at 50%.  Today he returns for HF follow up. Multiple complaints today. He is SOB when he goes to bed at night and when he wakes. He has chest pain during the day, and worse when he presses upper sternum. Pain is achy. He is dizzy all the time and has headaches. He works full time, no shortness of breath with activity or work duties. Continue to have arm numbness, PCP has given him steroid injections. Denies palpitations, abnormal bleeding, edema, or PND/Orthopnea. Appetite ok. No fever or chills. Weight  at home 185 pounds. Taking all medications.   Cardiac Studies  - Echo (6/23): EF 50%, RV ok  - Echo (8/19): EF 45-50%, diffuse HK  - Echo (4/17): EF 10-15%, normal RV  - Echo 10/17): EF ~40% normal RV  - cMRI  (3/18): LVEF 43% RVEF 41% NICM  - RHC/LHC (4/17):  RA = 2 RV = 28/0/4 PA = 28/11 (18) PCW = 5 Fick cardiac output/index = 4.3/2.3 PVR = 3.0 WU FA sat = 94% PA sat = 60%, 63%  1. Normal coronary arteries 2. Low filling pressures with normal cardiac output 3. Severe NICM with EF 10% by echo 3. Development of severe hypotension and shock in cath lab due to vasodilators to treat radial artery spasm  ROS: All systems reviewed and negative except as per HPI.  Current Outpatient Medications  Medication Sig Dispense Refill   aspirin 81 MG chewable tablet One pill by mouth daily 30 tablet 11   atorvastatin (LIPITOR) 80 MG tablet Take 1 tablet by mouth daily. 90 tablet 0   carvedilol (COREG) 12.5 MG tablet Take 1 tablet by mouth 2 times daily with a meal. 180 tablet 1   Cetirizine HCl (ZYRTEC ALLERGY PO) Take 1 tablet by mouth daily as needed.     empagliflozin (JARDIANCE) 10 MG TABS tablet Take 1 tablet (10 mg total) by mouth daily before breakfast. 30 tablet 11   metFORMIN (GLUCOPHAGE) 500 MG tablet Take 1 tablet (500 mg total) by mouth  daily with breakfast. 90 tablet 0   pantoprazole (PROTONIX) 40 MG tablet TAKE 1 TABLET BY MOUTH DAILY. 90 tablet 0   sacubitril-valsartan (ENTRESTO) 24-26 MG TAKE 1 TABLET BY MOUTH 2 TIMES DAILY (NEEDS OFFICE VISIT) 60 tablet 2   spironolactone (ALDACTONE) 25 MG tablet Take 1 tablet by mouth daily. 30 tablet 6   No current facility-administered medications for this encounter.   Allergies  Allergen Reactions   Amoxicillin Swelling    Eye swelling   Lasix [Furosemide] Swelling    Eye swelling   Social History   Socioeconomic History   Marital status: Divorced    Spouse name: Not on file   Number of children: Not on file   Years of  education: Not on file   Highest education level: Not on file  Occupational History   Not on file  Tobacco Use   Smoking status: Former    Packs/day: 2.00    Years: 20.00    Total pack years: 40.00    Types: Cigarettes    Quit date: 03/20/2012    Years since quitting: 9.5   Smokeless tobacco: Never  Vaping Use   Vaping Use: Never used  Substance and Sexual Activity   Alcohol use: No    Alcohol/week: 0.0 standard drinks of alcohol   Drug use: No   Sexual activity: Not on file  Other Topics Concern   Not on file  Social History Narrative   Patient is separated., no children.   Has pets   Works full time at [employer]    Social Determinants of Radio broadcast assistant Strain: Not on file  Food Insecurity: Not on file  Transportation Needs: Not on file  Physical Activity: Not on file  Stress: Not on file  Social Connections: Not on file  Intimate Partner Violence: Not on file   Family History  Problem Relation Age of Onset   Hyperlipidemia Mother    Hypertension Father    Hyperlipidemia Father    Irritable bowel syndrome Father    BP 119/70   Pulse 82   Wt 83.3 kg (183 lb 9.6 oz)   SpO2 99%   BMI 27.92 kg/m   Wt Readings from Last 3 Encounters:  10/03/21 83.3 kg (183 lb 9.6 oz)  09/23/21 83.4 kg (183 lb 14.4 oz)  09/05/21 83.3 kg (183 lb 9.6 oz)    PHYSICAL EXAM: General:  NAD. No resp difficulty HEENT: Normal Neck: Supple. No JVD. Carotids 2+ bilat; no bruits. No lymphadenopathy or thryomegaly appreciated. Cor: PMI nondisplaced. Regular rate & rhythm. No rubs, gallops or murmurs. + TTP upper sternum Lungs: Clear Abdomen: Soft, nontender, nondistended. No hepatosplenomegaly. No bruits or masses. Good bowel sounds. Extremities: No cyanosis, clubbing, rash, edema Neuro: Alert & oriented x 3, cranial nerves grossly intact. Moves all 4 extremities w/o difficulty. Affect pleasant.  ECG (personally reviewed): NSR 78 bpm  ASSESSMENT & PLAN: 1. Chest  pain, atypical - Cath (4/17) with no CAD. - Reproducible with palpitation, likely MSK vs GERD. - Repeat echo stable. ECG stable today, no changes. - Symptoms likely non-cardiac related.  - Discussed exercise stress test, he would like to proceed. Discussed with Dr. Haroldine Laws.  2. Chronic systolic heart failure  - cath (4/17): with normal coronaries. EF 15%. ? Viral CM. - Echo (10/17): with improvement of EF ~40%.  - Bedside Echo (2/18): EF 25-30% range.  - cMRI (3/18) LVEF 43% RV normal  - Echo (8/19): EF 45-50%  - Echo (  2/20): EF 55-60% RV mild HK  - Echo (6/23) EF 50%, RV ok - NYHA II, volume looks good today. - Continue Jardiance 10 mg daily. - Continue spiro 25 mg daily.  - Continue Entresto 24/26 mg bid.    - Continue carvedilol 12.5 mg bid. Discussed increasing today but he would like to keep at current dose.  - Recent labs reviewed and look OK.  3. HLD - Cont atorvastatin 80 mg. PCP following - LDL in 3/22 was 63 .  4. HTN - Blood pressure well controlled.  - Continue current regimen.  5. DM2 - on metformin. Followed by PCP - Continue SGLT2i. - A1C 5.7 (6/23)  Follow up with Dr. Haroldine Laws as scheduled.  Rafael Bihari, FNP  9:48 AM

## 2021-10-15 ENCOUNTER — Other Ambulatory Visit (HOSPITAL_COMMUNITY): Payer: Self-pay

## 2021-10-15 MED ORDER — CARVEDILOL 12.5 MG PO TABS
12.5000 mg | ORAL_TABLET | Freq: Two times a day (BID) | ORAL | 0 refills | Status: DC
  Filled 2021-10-15: qty 180, 90d supply, fill #0

## 2021-11-02 ENCOUNTER — Other Ambulatory Visit: Payer: Self-pay | Admitting: Family Medicine

## 2021-11-02 ENCOUNTER — Other Ambulatory Visit (HOSPITAL_COMMUNITY): Payer: Self-pay | Admitting: Internal Medicine

## 2021-11-02 DIAGNOSIS — I5020 Unspecified systolic (congestive) heart failure: Secondary | ICD-10-CM

## 2021-11-02 DIAGNOSIS — E785 Hyperlipidemia, unspecified: Secondary | ICD-10-CM

## 2021-11-03 ENCOUNTER — Other Ambulatory Visit (HOSPITAL_COMMUNITY): Payer: Self-pay

## 2021-11-03 NOTE — Telephone Encounter (Signed)
Requested Prescriptions  Pending Prescriptions Disp Refills  . atorvastatin (LIPITOR) 80 MG tablet 90 tablet 0    Sig: Take 1 tablet by mouth daily.     Cardiovascular:  Antilipid - Statins Failed - 11/02/2021  9:33 PM      Failed - Lipid Panel in normal range within the last 12 months    Cholesterol, Total  Date Value Ref Range Status  06/07/2020 127 100 - 199 mg/dL Final   LDL Chol Calc (NIH)  Date Value Ref Range Status  06/07/2020 63 0 - 99 mg/dL Final   HDL  Date Value Ref Range Status  06/07/2020 45 >39 mg/dL Final   Triglycerides  Date Value Ref Range Status  06/07/2020 105 0 - 149 mg/dL Final         Passed - Patient is not pregnant      Passed - Valid encounter within last 12 months    Recent Outpatient Visits          1 month ago Right arm pain   Primary Care at [facility], MD   2 months ago Heart failure with reduced ejection fraction, NYHA class II [facility])   Primary Care at [facility], MD   8 months ago Prediabetes   Primary Care at [facility], MD   1 year ago Encounter for annual physical exam   Primary Care at [facility], Bayard Beaver, MD   1 year ago Prediabetes   Primary Care at [facility], [place], [place]

## 2021-11-04 ENCOUNTER — Other Ambulatory Visit (HOSPITAL_COMMUNITY): Payer: Self-pay

## 2021-11-04 MED ORDER — ENTRESTO 24-26 MG PO TABS
1.0000 | ORAL_TABLET | Freq: Two times a day (BID) | ORAL | 11 refills | Status: DC
  Filled 2021-11-04: qty 60, 30d supply, fill #0
  Filled 2021-12-07: qty 60, 30d supply, fill #1
  Filled 2022-01-09: qty 60, 30d supply, fill #2
  Filled 2022-02-07: qty 60, 30d supply, fill #3
  Filled 2022-03-09: qty 60, 30d supply, fill #4
  Filled 2022-04-11: qty 60, 30d supply, fill #5
  Filled 2022-05-10: qty 60, 30d supply, fill #6
  Filled 2022-06-08: qty 60, 30d supply, fill #7
  Filled 2022-07-06: qty 60, 30d supply, fill #8
  Filled 2022-08-16: qty 60, 30d supply, fill #9
  Filled 2022-09-12: qty 60, 30d supply, fill #10
  Filled 2022-10-18: qty 60, 30d supply, fill #11

## 2021-11-07 ENCOUNTER — Other Ambulatory Visit (HOSPITAL_COMMUNITY): Payer: Self-pay

## 2021-11-29 ENCOUNTER — Other Ambulatory Visit (HOSPITAL_COMMUNITY): Payer: Self-pay

## 2021-11-29 ENCOUNTER — Other Ambulatory Visit: Payer: Self-pay | Admitting: Family

## 2021-11-29 DIAGNOSIS — K219 Gastro-esophageal reflux disease without esophagitis: Secondary | ICD-10-CM

## 2021-11-29 DIAGNOSIS — R7303 Prediabetes: Secondary | ICD-10-CM

## 2021-11-30 ENCOUNTER — Other Ambulatory Visit (HOSPITAL_COMMUNITY): Payer: Self-pay

## 2021-11-30 MED ORDER — METFORMIN HCL 500 MG PO TABS
500.0000 mg | ORAL_TABLET | Freq: Every day | ORAL | 0 refills | Status: DC
  Filled 2021-11-30: qty 90, 90d supply, fill #0

## 2021-11-30 MED ORDER — PANTOPRAZOLE SODIUM 40 MG PO TBEC
DELAYED_RELEASE_TABLET | Freq: Every day | ORAL | 0 refills | Status: DC
  Filled 2021-11-30: qty 90, 90d supply, fill #0

## 2021-11-30 NOTE — Telephone Encounter (Signed)
Requested Prescriptions  Pending Prescriptions Disp Refills  . metFORMIN (GLUCOPHAGE) 500 MG tablet 90 tablet 0    Sig: Take 1 tablet (500 mg total) by mouth daily with breakfast.     Endocrinology:  Diabetes - Biguanides Failed - 11/29/2021  4:42 PM      Failed - B12 Level in normal range and within 720 days    No results found for: "VITAMINB12"       Failed - CBC within normal limits and completed in the last 12 months    WBC  Date Value Ref Range Status  01/15/2020 6.3 3.4 - 10.8 x10E3/uL Final  10/22/2017 6.6 4.0 - 10.5 K/uL Final   RBC  Date Value Ref Range Status  01/15/2020 4.98 4.14 - 5.80 x10E6/uL Final  10/22/2017 4.74 4.22 - 5.81 MIL/uL Final   Hemoglobin  Date Value Ref Range Status  01/15/2020 15.0 13.0 - 17.7 g/dL Final   Total hemoglobin  Date Value Ref Range Status  07/14/2015 14.9 13.5 - 18.0 g/dL Final   Hematocrit  Date Value Ref Range Status  01/15/2020 45.3 37.5 - 51.0 % Final   MCHC  Date Value Ref Range Status  01/15/2020 33.1 31.5 - 35.7 g/dL Final  10/22/2017 33.3 30.0 - 36.0 g/dL Final   [facility]  Date Value Ref Range Status  01/15/2020 30.1 26.6 - 33.0 pg Final  10/22/2017 30.0 26.0 - 34.0 pg Final   MCV  Date Value Ref Range Status  01/15/2020 91 79 - 97 fL Final   No results found for: "PLTCOUNTKUC", "LABPLAT", "POCPLA" RDW  Date Value Ref Range Status  01/15/2020 12.4 11.6 - 15.4 % Final         Passed - Cr in normal range and within 360 days    Creatinine, Ser  Date Value Ref Range Status  09/05/2021 0.95 0.61 - 1.24 mg/dL Final         Passed - HBA1C is between 0 and 7.9 and within 180 days    Hgb A1c MFr Bld  Date Value Ref Range Status  09/05/2021 5.7 (H) 4.8 - 5.6 % Final    Comment:    (NOTE) Pre diabetes:          5.7%-6.4%  Diabetes:              >6.4%  Glycemic control for   <7.0% adults with diabetes          Passed - eGFR in normal range and within 360 days    GFR calc Af Amer  Date Value Ref Range Status   01/15/2020 89 >59 mL/min/1.73 Final    Comment:    **In accordance with recommendations from the NKF-ASN Task force,**   [facility] is in the process of updating its eGFR calculation to the   2021 CKD-EPI creatinine equation that estimates kidney function   without a race variable.    GFR, Estimated  Date Value Ref Range Status  09/05/2021 >60 >60 mL/min Final    Comment:    (NOTE) Calculated using the CKD-EPI Creatinine Equation (2021)    eGFR  Date Value Ref Range Status  06/07/2020 77 >59 mL/min/1.73 Final         Passed - Valid encounter within last 6 months    Recent Outpatient Visits          2 months ago Right arm pain   Primary Care at [facility], Clyde Canterbury, MD   3 months ago Heart failure with reduced ejection fraction,  NYHA class II [facility])   [facility] at [facility], MD   9 months ago Prediabetes   [facility] Care at [facility], [place]   1 year ago Encounter for annual physical exam   Primary Care at [facility], Bayard Beaver, MD   1 year ago Prediabetes   Primary Care at [facility], [place], PA-C             . pantoprazole (PROTONIX) 40 MG tablet 90 tablet 0    Sig: TAKE 1 TABLET BY MOUTH DAILY.     Gastroenterology: Proton Pump Inhibitors Passed - 11/29/2021  4:42 PM      Passed - Valid encounter within last 12 months    Recent Outpatient Visits          2 months ago Right arm pain   Primary Care at [facility], Clyde Canterbury, MD   3 months ago Heart failure with reduced ejection fraction, NYHA class II [facility])   Primary Care at [facility], MD   9 months ago Prediabetes   Primary Care at [facility], MD   1 year ago Encounter for annual physical exam   Primary Care at [facility], Bayard Beaver, MD   1 year ago Prediabetes   Primary Care at [facility], Dionne Bucy, [place]

## 2021-12-07 ENCOUNTER — Telehealth (HOSPITAL_COMMUNITY): Payer: Self-pay

## 2021-12-07 ENCOUNTER — Other Ambulatory Visit (HOSPITAL_COMMUNITY): Payer: Self-pay

## 2021-12-07 NOTE — Telephone Encounter (Addendum)
Advanced Heart Failure Patient Advocate Encounter   Received notification from [employer] that prior authorization needs renewal for Entresto 24-'26MG'$ . PA submitted and APPROVED on 12/07/2021.  Key [id number]  Effective: 12/07/2021 - 12/08/2022  Clista Bernhardt, CPhT Rx Patient Advocate Phone: [phone]

## 2021-12-08 ENCOUNTER — Ambulatory Visit (HOSPITAL_COMMUNITY)
Admission: RE | Admit: 2021-12-08 | Discharge: 2021-12-08 | Disposition: A | Discharge status: Home / Self Care | Payer: Self-pay | Source: Ambulatory Visit | Attending: Internal Medicine | Admitting: Internal Medicine

## 2021-12-08 ENCOUNTER — Encounter (HOSPITAL_COMMUNITY): Payer: Self-pay | Admitting: Internal Medicine

## 2021-12-08 ENCOUNTER — Other Ambulatory Visit (HOSPITAL_COMMUNITY): Payer: Self-pay

## 2021-12-08 VITALS — BP 116/80 | HR 83 | Wt 181.4 lb

## 2021-12-08 DIAGNOSIS — I959 Hypotension, unspecified: Secondary | ICD-10-CM | POA: Insufficient documentation

## 2021-12-08 DIAGNOSIS — R002 Palpitations: Secondary | ICD-10-CM | POA: Insufficient documentation

## 2021-12-08 DIAGNOSIS — I5022 Chronic systolic (congestive) heart failure: Secondary | ICD-10-CM | POA: Insufficient documentation

## 2021-12-08 DIAGNOSIS — R079 Chest pain, unspecified: Secondary | ICD-10-CM

## 2021-12-08 DIAGNOSIS — E785 Hyperlipidemia, unspecified: Secondary | ICD-10-CM | POA: Insufficient documentation

## 2021-12-08 DIAGNOSIS — I10 Essential (primary) hypertension: Secondary | ICD-10-CM

## 2021-12-08 DIAGNOSIS — E119 Type 2 diabetes mellitus without complications: Secondary | ICD-10-CM | POA: Insufficient documentation

## 2021-12-08 DIAGNOSIS — Z7984 Long term (current) use of oral hypoglycemic drugs: Secondary | ICD-10-CM | POA: Insufficient documentation

## 2021-12-08 DIAGNOSIS — I110 Hypertensive heart disease with heart failure: Secondary | ICD-10-CM | POA: Insufficient documentation

## 2021-12-08 DIAGNOSIS — K219 Gastro-esophageal reflux disease without esophagitis: Secondary | ICD-10-CM | POA: Insufficient documentation

## 2021-12-08 DIAGNOSIS — Z79899 Other long term (current) drug therapy: Secondary | ICD-10-CM | POA: Insufficient documentation

## 2021-12-08 NOTE — Progress Notes (Signed)
Patient ID: Jerome Barnett, male   DOB: 1970-08-01, 51 y.o.   MRN: [id number]    ADVANCED HF CLINIC  NOTE  PCP: Dorna Mai, MD HF MD: Dr. Haroldine Laws   HPI: Mr Hink is a 51 y.o. male with h/o HL, HTN and GERD who was admitted in 4/17 with acute HF. EF 15%.  Underwent cath in 4/17 normal cors with low filling pressures and normal cardiac output. Procedure complicated by radial artery spasm requiring multiple rounds of verapamil and NTG. Patient then developed shock and was supported with norepi and milrinone transiently. Was discharged home and then readmitted several days later with hypotension in setting of volume depletion due to severe restriction of po intake.    Seen in ED 08/03/15 and found to be orthostatic. Symptoms improved with 500 cc of IVF.   Admitted 10/22/17 with increased dyspnea. Echo performed and showed EF 45-50% (improved) . Troponin was negative and BNP was not elevated so he was discharged   Was seen in HF Clinic 8/16 with palpitations and dizziness. Around that time was getting separated from his wife and had to put dog down.   Zio patch 9/19 1. Sinus rhythm with rare PACs and PVCs. 2. No significant arrhythmias 3. Diary events of dizziness occasionaly correspond to PVCs but also triggered with NSR  Follow up 8/22, NYHA I, volume good. Repeat echo ordered.  Acute visit 6/23 with SOB and atypical chest pain. SGLT2i started and echo repeated, showing EF stable at 50%.  Today he returns for HF follow up. Feeling much better since last visit. Minimal muscle aches. Had done some research where carvedilol could cause muscle aches so he briefly took it only once a day. Recently started back BID, tolerating fine. 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 Used to go to the gym but hasn't since January d/t SOB experienced at that time. Encouraged to go back. Denies SOB, palpitations, abnormal bleeding, edema, or PND/Orthopnea. No fever or chills. Denies  ETOH and smoking. Taking all medications.   Cardiac Studies  - Echo (6/23): EF 50%, RV ok - Echo (2/20): EF 55-60% RV mild HK  - Echo (8/19): EF 45-50%, diffuse HK - Echo (4/17): EF 10-15%, normal RV - Echo 10/17): EF ~40% normal RV - cMRI  (3/18): LVEF 43% RVEF 41% NICM  - RHC/LHC (4/17):  RA = 2 RV = 28/0/4 PA = 28/11 (18) PCW = 5 Fick cardiac output/index = 4.3/2.3 PVR = 3.0 WU FA sat = 94% PA sat = 60%, 63% 1. Normal coronary arteries 2. Low filling pressures with normal cardiac output 3. Severe NICM with EF 10% by echo 3. Development of severe hypotension and shock in cath lab due to vasodilators to treat radial artery spasm  ROS: All systems reviewed and negative except as per HPI.  Current Outpatient Medications  Medication Sig Dispense Refill   aspirin 81 MG chewable tablet One pill by mouth daily 30 tablet 11   atorvastatin (LIPITOR) 80 MG tablet Take 1 tablet by mouth daily. 90 tablet 0   carvedilol (COREG) 12.5 MG tablet Take 1 tablet by mouth 2 times daily with a meal. 180 tablet 0   Cetirizine HCl (ZYRTEC ALLERGY PO) Take 1 tablet by mouth daily as needed.     empagliflozin (JARDIANCE) 10 MG TABS tablet Take 1 tablet (10 mg total) by mouth daily before breakfast. 30 tablet 11   metFORMIN (GLUCOPHAGE) 500 MG tablet Take 1 tablet (500 mg total) by mouth daily with  breakfast. 90 tablet 0   pantoprazole (PROTONIX) 40 MG tablet TAKE 1 TABLET BY MOUTH DAILY. 90 tablet 0   sacubitril-valsartan (ENTRESTO) 24-26 MG Take 1 tablet by mouth 2 (two) times daily. 60 tablet 11   spironolactone (ALDACTONE) 25 MG tablet Take 1 tablet by mouth daily. 30 tablet 6   No current facility-administered medications for this encounter.   Allergies  Allergen Reactions   Amoxicillin Swelling    Eye swelling   Lasix [Furosemide] Swelling    Eye swelling   Social History   Socioeconomic History   Marital status: Divorced    Spouse name: Not on file   Number of children: Not on file    Years of education: Not on file   Highest education level: Not on file  Occupational History   Not on file  Tobacco Use   Smoking status: Former    Packs/day: 2.00    Years: 20.00    Total pack years: 40.00    Types: Cigarettes    Quit date: 03/20/2012    Years since quitting: 9.7   Smokeless tobacco: Never  Vaping Use   Vaping Use: Never used  Substance and Sexual Activity   Alcohol use: No    Alcohol/week: 0.0 standard drinks of alcohol   Drug use: No   Sexual activity: Not on file  Other Topics Concern   Not on file  Social History Narrative   Patient is separated., no children.   Has pets   Works full time at [employer]    Social Determinants of Radio broadcast assistant Strain: Not on file  Food Insecurity: Not on file  Transportation Needs: Not on file  Physical Activity: Not on file  Stress: Not on file  Social Connections: Not on file  Intimate Partner Violence: Not on file   Family History  Problem Relation Age of Onset   Hyperlipidemia Mother    Hypertension Father    Hyperlipidemia Father    Irritable bowel syndrome Father    BP 116/80   Pulse 83   Wt 82.3 kg (181 lb 6.4 oz)   SpO2 96%   BMI 27.58 kg/m   Wt Readings from Last 3 Encounters:  12/08/21 82.3 kg (181 lb 6.4 oz)  10/03/21 83.3 kg (183 lb 9.6 oz)  09/23/21 83.4 kg (183 lb 14.4 oz)    PHYSICAL EXAM: General:  well appearing. Looks stated age. No respiratory difficulty HEENT: normal Neck: supple. JVD ~6 cm. Carotids 2+ bilat; no bruits. No lymphadenopathy or thyromegaly appreciated. Cor: PMI nondisplaced. Regular rate & rhythm. No rubs, gallops or murmurs. Lungs: clear Abdomen: soft, nontender, nondistended. No hepatosplenomegaly. No bruits or masses. Good bowel sounds. Extremities: no cyanosis, clubbing, rash, edema  Neuro: alert & oriented x 3, cranial nerves grossly intact. moves all 4 extremities w/o difficulty. Affect pleasant.   ECG 10/03/21 NSR 78 bpm  ASSESSMENT &  PLAN: 1. Chronic systolic heart failure  - cath (4/17): with normal coronaries. EF 15%. ? Viral CM. - Echo (10/17): with improvement of EF ~40%.  - Bedside Echo (2/18): EF 25-30% range.  - cMRI (3/18) LVEF 43% RV normal  - Echo (8/19): EF 45-50%  - Echo (2/20): EF 55-60% RV mild HK  - Echo (6/23) EF 50%, global hypokinesis, RV ok - NYHA II, volume looks good today. - Continue Jardiance 10 mg daily. - Continue spiro 25 mg daily.  - Continue Entresto 24/26 mg bid.    - Continue carvedilol 12.5 mg  bid.   2. H/o Chest pain, atypical - Cath (4/17) with no CAD. - Was reproducible with palpitation, likely MSK vs GERD, likely non-cardiac related.  - Repeat echo stable.  - Saw PCP (7/23) for muscular pain, much better with IM Kenalog   3. HLD - Cont atorvastatin 80 mg. PCP following - LDL in 3/22 was 63 .  4. HTN - Blood pressure well controlled.  - Continue current regimen.  5. DM2 - on metformin. Followed by PCP - Continue SGLT2i. - A1C 5.7 (6/23)   Earnie Larsson, AGACNP-BC  9:55 AM   Patient seen and examined with the above-signed Advanced Practice Provider and/or Housestaff. I personally reviewed laboratory data, imaging studies and relevant notes. I independently examined the patient and formulated the important aspects of the plan. I have edited the note to reflect any of my changes or salient points. I have personally discussed the plan with the patient and/or family.  Doing well from HF perspective. Active. Minimal symptoms. CP much improved. Volume status ok.   General:  Well appearing. No resp difficulty HEENT: normal Neck: supple. no JVD. Carotids 2+ bilat; no bruits. No lymphadenopathy or thryomegaly appreciated. Cor: PMI nondisplaced. Regular rate & rhythm. No rubs, gallops or murmurs. Lungs: clear Abdomen: soft, nontender, nondistended. No hepatosplenomegaly. No bruits or masses. Good bowel sounds. Extremities: no cyanosis, clubbing, rash, edema Neuro: alert &  orientedx3, cranial nerves grossly intact. moves all 4 extremities w/o difficulty. Affect pleasant  Doing well. HF stable NYHA II. CP has improved. Previous cath with normal coronaries.  Continue current regimen. F/u with 1 year with echo.   Glori Bickers, MD  10:46 AM

## 2021-12-08 NOTE — Patient Instructions (Signed)
Your physician recommends that you schedule a follow-up appointment in: 1 year with an echocardiogram (Sept 2024), **PLEASE CALL OUR OFFICE IN JULY TO SCHEDULE THESE APPOINTMENTS  If you have any questions or concerns before your next appointment please send [place] a message through [place] or call our office at [phone].    TO LEAVE A MESSAGE FOR THE NURSE SELECT OPTION 2, PLEASE LEAVE A MESSAGE INCLUDING: YOUR NAME DATE OF BIRTH CALL BACK NUMBER REASON FOR CALL**this is important as we prioritize the call backs  YOU WILL RECEIVE A CALL BACK THE SAME DAY AS LONG AS YOU CALL BEFORE 4:00 PM  At the [facility], you and your health needs are our priority. As part of our continuing mission to provide you with exceptional heart care, we have created designated Provider Care Teams. These Care Teams include your primary Cardiologist (physician) and Advanced Practice Providers (APPs- Physician Assistants and Nurse Practitioners) who all work together to provide you with the care you need, when you need it.   You may see any of the following providers on your designated Care Team at your next follow up: Dr Glori Bickers Dr Loralie Champagne Dr. Roxana Hires, NP Lyda Jester, [place] [facility] [place], [place] Forestine Na, NP Audry Riles, PharmD   Please be sure to bring in all your medications bottles to every appointment.

## 2021-12-27 ENCOUNTER — Other Ambulatory Visit: Payer: Self-pay | Admitting: Family Medicine

## 2021-12-27 DIAGNOSIS — E785 Hyperlipidemia, unspecified: Secondary | ICD-10-CM

## 2021-12-28 ENCOUNTER — Other Ambulatory Visit (HOSPITAL_COMMUNITY): Payer: Self-pay

## 2021-12-28 MED ORDER — ATORVASTATIN CALCIUM 80 MG PO TABS
80.0000 mg | ORAL_TABLET | Freq: Every day | ORAL | 0 refills | Status: DC
  Filled 2021-12-28: qty 90, 90d supply, fill #0

## 2021-12-28 NOTE — Telephone Encounter (Signed)
Requested Prescriptions  Pending Prescriptions Disp Refills  . atorvastatin (LIPITOR) 80 MG tablet 90 tablet 0    Sig: Take 1 tablet by mouth daily.     Cardiovascular:  Antilipid - Statins Failed - 12/27/2021 10:06 PM      Failed - Lipid Panel in normal range within the last 12 months    Cholesterol, Total  Date Value Ref Range Status  06/07/2020 127 100 - 199 mg/dL Final   LDL Chol Calc (NIH)  Date Value Ref Range Status  06/07/2020 63 0 - 99 mg/dL Final   HDL  Date Value Ref Range Status  06/07/2020 45 >39 mg/dL Final   Triglycerides  Date Value Ref Range Status  06/07/2020 105 0 - 149 mg/dL Final         Passed - Patient is not pregnant      Passed - Valid encounter within last 12 months    Recent Outpatient Visits          3 months ago Right arm pain   Primary Care at [facility], Clyde Canterbury, MD   4 months ago Heart failure with reduced ejection fraction, NYHA class II [facility])   Primary Care at [facility], MD   10 months ago Prediabetes   Primary Care at [facility], MD   1 year ago Encounter for annual physical exam   Primary Care at [facility], Bayard Beaver, MD   1 year ago Prediabetes   Primary Care at [facility] [address], [place]

## 2022-01-10 ENCOUNTER — Other Ambulatory Visit (HOSPITAL_COMMUNITY): Payer: Self-pay

## 2022-01-28 ENCOUNTER — Other Ambulatory Visit: Payer: Self-pay | Admitting: Family Medicine

## 2022-01-28 ENCOUNTER — Other Ambulatory Visit (HOSPITAL_COMMUNITY): Payer: Self-pay

## 2022-01-28 ENCOUNTER — Other Ambulatory Visit (HOSPITAL_COMMUNITY): Payer: Self-pay | Admitting: Internal Medicine

## 2022-01-28 DIAGNOSIS — I10 Essential (primary) hypertension: Secondary | ICD-10-CM

## 2022-01-28 DIAGNOSIS — I5020 Unspecified systolic (congestive) heart failure: Secondary | ICD-10-CM

## 2022-01-28 MED ORDER — CARVEDILOL 12.5 MG PO TABS
12.5000 mg | ORAL_TABLET | Freq: Two times a day (BID) | ORAL | 0 refills | Status: DC
  Filled 2022-01-28: qty 180, 90d supply, fill #0

## 2022-01-30 ENCOUNTER — Other Ambulatory Visit (HOSPITAL_COMMUNITY): Payer: Self-pay

## 2022-01-30 MED ORDER — SPIRONOLACTONE 25 MG PO TABS
25.0000 mg | ORAL_TABLET | Freq: Every day | ORAL | 6 refills | Status: DC
  Filled 2022-01-30: qty 30, 30d supply, fill #0
  Filled 2022-03-04: qty 30, 30d supply, fill #1
  Filled 2022-04-03: qty 30, 30d supply, fill #2
  Filled 2022-05-10: qty 30, 30d supply, fill #3
  Filled 2022-06-08: qty 30, 30d supply, fill #4
  Filled 2022-07-06: qty 30, 30d supply, fill #5
  Filled 2022-08-16: qty 30, 30d supply, fill #6

## 2022-02-08 ENCOUNTER — Other Ambulatory Visit (HOSPITAL_COMMUNITY): Payer: Self-pay

## 2022-03-04 ENCOUNTER — Other Ambulatory Visit: Payer: Self-pay | Admitting: Family

## 2022-03-04 DIAGNOSIS — K219 Gastro-esophageal reflux disease without esophagitis: Secondary | ICD-10-CM

## 2022-03-04 DIAGNOSIS — R7303 Prediabetes: Secondary | ICD-10-CM

## 2022-03-06 ENCOUNTER — Other Ambulatory Visit: Payer: Self-pay

## 2022-03-06 ENCOUNTER — Other Ambulatory Visit (HOSPITAL_COMMUNITY): Payer: Self-pay

## 2022-03-06 MED ORDER — PANTOPRAZOLE SODIUM 40 MG PO TBEC
40.0000 mg | DELAYED_RELEASE_TABLET | Freq: Every day | ORAL | 0 refills | Status: DC
  Filled 2022-03-06: qty 90, 90d supply, fill #0

## 2022-03-06 MED ORDER — METFORMIN HCL 500 MG PO TABS
500.0000 mg | ORAL_TABLET | Freq: Every day | ORAL | 0 refills | Status: DC
  Filled 2022-03-06: qty 90, 90d supply, fill #0

## 2022-03-10 ENCOUNTER — Other Ambulatory Visit: Payer: Self-pay

## 2022-03-23 ENCOUNTER — Other Ambulatory Visit (HOSPITAL_COMMUNITY): Payer: Self-pay

## 2022-03-23 ENCOUNTER — Ambulatory Visit: Payer: 59 | Admitting: Podiatry

## 2022-03-23 ENCOUNTER — Encounter: Payer: Self-pay | Admitting: Podiatry

## 2022-03-23 ENCOUNTER — Ambulatory Visit (INDEPENDENT_AMBULATORY_CARE_PROVIDER_SITE_OTHER): Payer: 59

## 2022-03-23 ENCOUNTER — Ambulatory Visit: Payer: 59

## 2022-03-23 DIAGNOSIS — M7752 Other enthesopathy of left foot: Secondary | ICD-10-CM | POA: Diagnosis not present

## 2022-03-23 DIAGNOSIS — M7672 Peroneal tendinitis, left leg: Secondary | ICD-10-CM

## 2022-03-23 DIAGNOSIS — M7750 Other enthesopathy of unspecified foot: Secondary | ICD-10-CM

## 2022-03-23 MED ORDER — METHYLPREDNISOLONE 4 MG PO TBPK
ORAL_TABLET | ORAL | 0 refills | Status: DC
  Filled 2022-03-23: qty 21, 6d supply, fill #0

## 2022-03-23 NOTE — Patient Instructions (Signed)
Look for Voltaren gel at the pharmacy over the counter or online (also known as diclofenac 1% gel). Apply to the painful areas 3-4x daily with the supplied dosing card. Allow to dry for 10 minutes before going into socks/shoes   Peroneal Tendinopathy Rehab Ask your health care provider which exercises are safe for you. Do exercises exactly as told by your health care provider and adjust them as directed. It is normal to feel mild stretching, pulling, tightness, or discomfort as you do these exercises. Stop right away if you feel sudden pain or your pain gets worse. Do not begin these exercises until told by your health care provider. Stretching and range-of-motion exercises These exercises warm up your muscles and joints and improve the movement and flexibility of your ankle. These exercises also help to relieve pain and stiffness. Gastroc and soleus stretch, standing  This is an exercise in which you stand on a step and use your body weight to stretch your calf muscles. To do this exercise: Stand on the edge of a step on the ball of your left / right foot. The ball of your foot is on the walking surface, right under your toes. Keep your other foot firmly on the same step. Hold on to the wall, a railing, or a chair for balance. Slowly lift your other foot, allowing your body weight to press your left / right heel down over the edge of the step. You should feel a stretch in your left / right calf (gastrocnemius and soleus). Hold this position for 15 seconds. Return both feet to the step. Repeat this exercise with a slight bend in your left / right knee. Repeat 5 times with your left / right knee straight and 5 times with your left / right knee bent. Complete this exercise 2 times a day. Strengthening exercises These exercises build strength and endurance in your foot and ankle. Endurance is the ability to use your muscles for a long time, even after they get tired. Ankle dorsiflexion with  band   Secure a rubber exercise band or tube to an object, such as a table leg, that will not move when the band is pulled. Secure the other end of the band around your left / right foot. Sit on the floor, facing the object with your left / right leg extended. The band or tube should be slightly tense when your foot is relaxed. Slowly flex your left / right ankle and toes to bring your foot toward you (dorsiflexion). Hold this position for 15 seconds. Let the band or tube slowly pull your foot back to the starting position. Repeat 5 times. Complete this exercise 2 times a day. Ankle eversion Sit on the floor with your legs straight out in front of you. Loop a rubber exercise band or tube around the ball of your left / right foot. The ball of your foot is on the walking surface, right under your toes. Hold the ends of the band in your hands, or secure the band to a stable object. The band or tube should be slightly tense when your foot is relaxed. Slowly push your foot outward, away from your other leg (eversion). Hold this position for 15 seconds. Slowly return your foot to the starting position. Repeat 5 times. Complete this exercise 2 times a day. Plantar flexion, standing  This exercise is sometimes called standing heel raise. Stand with your feet shoulder-width apart. Place your hands on a wall or table to steady yourself as   needed, but try not to use it for support. Keep your weight spread evenly over the width of your feet while you slowly rise up on your toes (plantar flexion). If told by your health care provider: Shift your weight toward your left / right leg until you feel challenged. Stand on your left / right leg only. Hold this position for 15 seconds. Repeat 2 times. Complete this exercise 2 times a day. Single leg stand Without shoes, stand near a railing or in a doorway. You may hold on to the railing or door frame as needed. Stand on your left / right foot. Keep your  big toe down on the floor and try to keep your arch lifted. Do not roll to the outside of your foot. If this exercise is too easy, you can try it with your eyes closed or while standing on a pillow. Hold this position for 15 seconds. Repeat 5 times. Complete this exercise 2 times a day. This information is not intended to replace advice given to you by your health care provider. Make sure you discuss any questions you have with your health care provider. Document Revised: 06/25/2018 Document Reviewed: 06/25/2018 Elsevier Patient Education  2020 [employer] Inc[employer]  

## 2022-03-24 ENCOUNTER — Other Ambulatory Visit: Payer: Self-pay

## 2022-03-24 ENCOUNTER — Other Ambulatory Visit (HOSPITAL_COMMUNITY): Payer: Self-pay

## 2022-03-27 ENCOUNTER — Encounter: Payer: Self-pay | Admitting: Podiatry

## 2022-03-27 NOTE — Progress Notes (Signed)
  Subjective:  Patient ID: Jerome Barnett, male    DOB: 1970/10/26,  MRN: [id number]  Chief Complaint  Patient presents with   Foot Pain    NP Pain in my left foot since Monday    52 y.o. male presents with the above complaint. History confirmed with patient.  He complains of burning aching pain on the outside of the ankle  Objective:  Physical Exam: warm, good capillary refill, no trophic changes or ulcerative lesions, normal DP and PT pulses, normal sensory exam, and pain on palpation to and with resisted eversion along the peroneal tendons.  Radiographs: Multiple views x-ray of the left foot: no fracture, dislocation, swelling or degenerative changes noted Assessment:   1. Peroneal tendinitis of left lower extremity      Plan:  Patient was evaluated and treated and all questions answered.  Discussed the etiology and treatment options for peroneal tendinitis including stretching, formal physical therapy with an eccentric exercises therapy plan, supportive shoegears such as a running shoe or sneaker, bracing, topical and oral medications.  We also discussed that I do not routinely perform injections in this area because of the risk of an increased damage or rupture of the tendon.  We also discussed the role of surgical treatment of this for patients who do not improve after exhausting non-surgical treatment options.  -XR reviewed with patient -Educated on stretching and icing of the affected limb. -Rx for Medrol 6-day taper. Advised on risks, benefits, and alternatives of the medication -Discussed topical diclofenac gel which he will get and use topically 3-4 times daily -Discussed if not improving or worsening with recommend immobilization in a cam walker boot and physical therapy, possible MRI  Return if symptoms worsen or fail to improve.

## 2022-04-03 ENCOUNTER — Other Ambulatory Visit: Payer: Self-pay | Admitting: Family Medicine

## 2022-04-03 DIAGNOSIS — E785 Hyperlipidemia, unspecified: Secondary | ICD-10-CM

## 2022-04-04 ENCOUNTER — Other Ambulatory Visit: Payer: Self-pay

## 2022-04-04 ENCOUNTER — Other Ambulatory Visit (HOSPITAL_COMMUNITY): Payer: Self-pay

## 2022-04-04 MED ORDER — ATORVASTATIN CALCIUM 80 MG PO TABS
80.0000 mg | ORAL_TABLET | Freq: Every day | ORAL | 0 refills | Status: DC
  Filled 2022-04-04: qty 90, 90d supply, fill #0

## 2022-04-12 ENCOUNTER — Other Ambulatory Visit: Payer: Self-pay

## 2022-05-03 ENCOUNTER — Other Ambulatory Visit: Payer: Self-pay | Admitting: Family Medicine

## 2022-05-03 ENCOUNTER — Other Ambulatory Visit: Payer: Self-pay

## 2022-05-03 MED ORDER — CARVEDILOL 12.5 MG PO TABS
12.5000 mg | ORAL_TABLET | Freq: Two times a day (BID) | ORAL | 0 refills | Status: DC
  Filled 2022-05-03: qty 180, 90d supply, fill #0

## 2022-05-04 ENCOUNTER — Ambulatory Visit: Payer: 59 | Admitting: Podiatry

## 2022-05-11 ENCOUNTER — Other Ambulatory Visit (HOSPITAL_COMMUNITY): Payer: Self-pay

## 2022-06-08 ENCOUNTER — Other Ambulatory Visit: Payer: Self-pay | Admitting: Family Medicine

## 2022-06-08 DIAGNOSIS — K219 Gastro-esophageal reflux disease without esophagitis: Secondary | ICD-10-CM

## 2022-06-08 DIAGNOSIS — R7303 Prediabetes: Secondary | ICD-10-CM

## 2022-06-09 ENCOUNTER — Other Ambulatory Visit (HOSPITAL_COMMUNITY): Payer: Self-pay

## 2022-06-09 ENCOUNTER — Other Ambulatory Visit: Payer: Self-pay

## 2022-06-09 MED ORDER — PANTOPRAZOLE SODIUM 40 MG PO TBEC
40.0000 mg | DELAYED_RELEASE_TABLET | Freq: Every day | ORAL | 0 refills | Status: DC
  Filled 2022-06-09: qty 90, 90d supply, fill #0

## 2022-06-09 MED ORDER — METFORMIN HCL 500 MG PO TABS
500.0000 mg | ORAL_TABLET | Freq: Every day | ORAL | 0 refills | Status: DC
  Filled 2022-06-09: qty 90, 90d supply, fill #0

## 2022-06-09 NOTE — Telephone Encounter (Signed)
Requested Prescriptions  Pending Prescriptions Disp Refills   metFORMIN (GLUCOPHAGE) 500 MG tablet 90 tablet 0    Sig: Take 1 tablet (500 mg total) by mouth daily with breakfast.     Endocrinology:  Diabetes - Biguanides Failed - 06/08/2022  9:33 PM      Failed - HBA1C is between 0 and 7.9 and within 180 days    Hgb A1c MFr Bld  Date Value Ref Range Status  09/05/2021 5.7 (H) 4.8 - 5.6 % Final    Comment:    (NOTE) Pre diabetes:          5.7%-6.4%  Diabetes:              >6.4%  Glycemic control for   <7.0% adults with diabetes          Failed - B12 Level in normal range and within 720 days    No results found for: "VITAMINB12"       Failed - Valid encounter within last 6 months    Recent Outpatient Visits           8 months ago Right arm pain   [place] [facility], [place]   9 months ago Heart failure with reduced ejection fraction, NYHA class II [facility])   [place] [facility] at [facility], [place]   1 year ago Prediabetes   [place] [facility], Clyde Canterbury, MD   2 years ago Encounter for annual physical exam   [place] [facility], Bayard Beaver, MD   2 years ago Prediabetes   [place] [facility] at [facility], [place], [place]              Failed - CBC within normal limits and completed in the last 12 months    WBC  Date Value Ref Range Status  01/15/2020 6.3 3.4 - 10.8 x10E3/uL Final  10/22/2017 6.6 4.0 - 10.5 K/uL Final   RBC  Date Value Ref Range Status  01/15/2020 4.98 4.14 - 5.80 x10E6/uL Final  10/22/2017 4.74 4.22 - 5.81 MIL/uL Final   Hemoglobin  Date Value Ref Range Status  01/15/2020 15.0 13.0 - 17.7 g/dL Final   Total hemoglobin  Date Value Ref Range Status  07/14/2015 14.9 13.5 - 18.0 g/dL Final   Hematocrit  Date Value Ref Range Status  01/15/2020 45.3 37.5 - 51.0 % Final   MCHC  Date Value Ref Range Status   01/15/2020 33.1 31.5 - 35.7 g/dL Final  10/22/2017 33.3 30.0 - 36.0 g/dL Final   [facility]  Date Value Ref Range Status  01/15/2020 30.1 26.6 - 33.0 pg Final  10/22/2017 30.0 26.0 - 34.0 pg Final   MCV  Date Value Ref Range Status  01/15/2020 91 79 - 97 fL Final   No results found for: "PLTCOUNTKUC", "LABPLAT", "POCPLA" RDW  Date Value Ref Range Status  01/15/2020 12.4 11.6 - 15.4 % Final         Passed - Cr in normal range and within 360 days    Creatinine, Ser  Date Value Ref Range Status  09/05/2021 0.95 0.61 - 1.24 mg/dL Final         Passed - eGFR in normal range and within 360 days    GFR calc Af Amer  Date Value Ref Range Status  01/15/2020 89 >59 mL/min/1.73 Final    Comment:    **In accordance with  recommendations from the NKF-ASN Task force,**   [facility] is in the process of updating its eGFR calculation to the   2021 CKD-EPI creatinine equation that estimates kidney function   without a race variable.    GFR, Estimated  Date Value Ref Range Status  09/05/2021 >60 >60 mL/min Final    Comment:    (NOTE) Calculated using the CKD-EPI Creatinine Equation (2021)    eGFR  Date Value Ref Range Status  06/07/2020 77 >59 mL/min/1.73 Final          pantoprazole (PROTONIX) 40 MG tablet 90 tablet 0    Sig: Take 1 tablet (40 mg total) by mouth daily.     Gastroenterology: Proton Pump Inhibitors Passed - 06/08/2022  9:33 PM      Passed - Valid encounter within last 12 months    Recent Outpatient Visits           8 months ago Right arm pain   [place] [facility], Clyde Canterbury, [place]   9 months ago Heart failure with reduced ejection fraction, NYHA class II [facility])   [place] [facility] at [facility], MD   1 year ago Prediabetes   [place] [facility], MD   2 years ago Encounter for annual physical exam   [place] [facility] at [facility], Bayard Beaver, MD   2  years ago Prediabetes   [facility] at [facility], [place], [place]

## 2022-07-06 ENCOUNTER — Other Ambulatory Visit: Payer: Self-pay | Admitting: Family Medicine

## 2022-07-06 DIAGNOSIS — E785 Hyperlipidemia, unspecified: Secondary | ICD-10-CM

## 2022-07-07 ENCOUNTER — Other Ambulatory Visit: Payer: Self-pay

## 2022-07-07 NOTE — Telephone Encounter (Signed)
Requested medications are due for refill today.  yes  Requested medications are on the active medications list.  yes  Last refill. 04/04/2022 #90 0 rf  Future visit scheduled.   no  Notes to clinic.  Labs are expired.    Requested Prescriptions  Pending Prescriptions Disp Refills   atorvastatin (LIPITOR) 80 MG tablet 90 tablet 0    Sig: Take 1 tablet (80 mg total) by mouth daily.     Cardiovascular:  Antilipid - Statins Failed - 07/06/2022 10:11 PM      Failed - Lipid Panel in normal range within the last 12 months    Cholesterol, Total  Date Value Ref Range Status  06/07/2020 127 100 - 199 mg/dL Final   LDL Chol Calc (NIH)  Date Value Ref Range Status  06/07/2020 63 0 - 99 mg/dL Final   HDL  Date Value Ref Range Status  06/07/2020 45 >39 mg/dL Final   Triglycerides  Date Value Ref Range Status  06/07/2020 105 0 - 149 mg/dL Final         Passed - Patient is not pregnant      Passed - Valid encounter within last 12 months    Recent Outpatient Visits           9 months ago Right arm pain   [place] [facility] at [facility], Lauris Poag, MD   10 months ago Heart failure with reduced ejection fraction, NYHA class II [facility])   [place] [facility] at [facility], [place]   1 year ago Prediabetes   [place] [facility] [place], [place]   2 years ago Encounter for annual physical exam   [place] [facility] at [facility], Kandee Keen, MD   2 years ago Prediabetes   [facility] at [facility], [place], [place]

## 2022-07-14 ENCOUNTER — Other Ambulatory Visit: Payer: Self-pay | Admitting: Family Medicine

## 2022-07-14 DIAGNOSIS — E785 Hyperlipidemia, unspecified: Secondary | ICD-10-CM

## 2022-07-17 ENCOUNTER — Other Ambulatory Visit: Payer: Self-pay

## 2022-07-17 ENCOUNTER — Other Ambulatory Visit (HOSPITAL_COMMUNITY): Payer: Self-pay

## 2022-07-17 MED ORDER — ATORVASTATIN CALCIUM 80 MG PO TABS
80.0000 mg | ORAL_TABLET | Freq: Every day | ORAL | 0 refills | Status: DC
  Filled 2022-07-17: qty 90, 90d supply, fill #0

## 2022-07-17 NOTE — Telephone Encounter (Signed)
Requested medication (s) are due for refill today: yes  Requested medication (s) are on the active medication list: yes  Last refill:  04/04/22  Future visit scheduled: no  Notes to clinic:  Unable to refill per protocol due to failed labs, no updated results. Routing for approval.      Requested Prescriptions  Pending Prescriptions Disp Refills   atorvastatin (LIPITOR) 80 MG tablet 90 tablet 0    Sig: Take 1 tablet (80 mg total) by mouth daily.     Cardiovascular:  Antilipid - Statins Failed - 07/14/2022 10:25 PM      Failed - Lipid Panel in normal range within the last 12 months    Cholesterol, Total  Date Value Ref Range Status  06/07/2020 127 100 - 199 mg/dL Final   LDL Chol Calc (NIH)  Date Value Ref Range Status  06/07/2020 63 0 - 99 mg/dL Final   HDL  Date Value Ref Range Status  06/07/2020 45 >39 mg/dL Final   Triglycerides  Date Value Ref Range Status  06/07/2020 105 0 - 149 mg/dL Final         Passed - Patient is not pregnant      Passed - Valid encounter within last 12 months    Recent Outpatient Visits           9 months ago Right arm pain   [place] [facility], Lauris Poag, MD   11 months ago Heart failure with reduced ejection fraction, NYHA class II [facility])   [place] [facility] at [facility], MD   1 year ago Prediabetes   [place] [facility], MD   2 years ago Encounter for annual physical exam   [place] [facility] at [facility], Kandee Keen, MD   2 years ago Prediabetes   [facility] at [facility], [place], [place]

## 2022-08-03 ENCOUNTER — Other Ambulatory Visit: Payer: Self-pay | Admitting: Family Medicine

## 2022-08-04 ENCOUNTER — Other Ambulatory Visit: Payer: Self-pay

## 2022-08-04 MED ORDER — CARVEDILOL 12.5 MG PO TABS
12.5000 mg | ORAL_TABLET | Freq: Two times a day (BID) | ORAL | 0 refills | Status: DC
  Filled 2022-08-04: qty 180, 90d supply, fill #0

## 2022-08-04 NOTE — Telephone Encounter (Signed)
Requested medication (s) are due for refill today: yes  Requested medication (s) are on the active medication list: yes  Last refill:  05/03/22 #180   Future visit scheduled: no  Notes to clinic:  called pt and LM on VM to call back to make appt for med refill   Requested Prescriptions  Pending Prescriptions Disp Refills   carvedilol (COREG) 12.5 MG tablet 180 tablet 0    Sig: Take 1 tablet by mouth 2 times daily with a meal.     Cardiovascular: Beta Blockers 3 Failed - 08/03/2022 11:06 PM      Failed - Valid encounter within last 6 months    Recent Outpatient Visits           10 months ago Right arm pain   [place] [facility] at [facility], Lauris Poag, MD   11 months ago Heart failure with reduced ejection fraction, NYHA class II [facility])   [place] [facility], MD   1 year ago Prediabetes   [place] [facility] at [facility], Lauris Poag, MD   2 years ago Encounter for annual physical exam   [place] [facility] at [facility], Kandee Keen, [place]   2 years ago Prediabetes   [facility], Marylene Land [place], [place]              Passed - Cr in normal range and within 360 days    Creatinine, Ser  Date Value Ref Range Status  09/05/2021 0.95 0.61 - 1.24 mg/dL Final         Passed - AST in normal range and within 360 days    AST  Date Value Ref Range Status  09/05/2021 20 15 - 41 U/L Final         Passed - ALT in normal range and within 360 days    ALT  Date Value Ref Range Status  09/05/2021 34 0 - 44 U/L Final         Passed - Last BP in normal range    BP Readings from Last 1 Encounters:  12/08/21 116/80         Passed - Last Heart Rate in normal range    Pulse Readings from Last 1 Encounters:  12/08/21 83

## 2022-08-17 ENCOUNTER — Other Ambulatory Visit: Payer: Self-pay

## 2022-09-12 ENCOUNTER — Other Ambulatory Visit: Payer: Self-pay | Admitting: Family Medicine

## 2022-09-12 ENCOUNTER — Other Ambulatory Visit (HOSPITAL_COMMUNITY): Payer: Self-pay | Admitting: Internal Medicine

## 2022-09-12 ENCOUNTER — Other Ambulatory Visit (HOSPITAL_COMMUNITY): Payer: Self-pay | Admitting: Family Medicine

## 2022-09-12 DIAGNOSIS — I5020 Unspecified systolic (congestive) heart failure: Secondary | ICD-10-CM

## 2022-09-12 DIAGNOSIS — K219 Gastro-esophageal reflux disease without esophagitis: Secondary | ICD-10-CM

## 2022-09-12 DIAGNOSIS — I10 Essential (primary) hypertension: Secondary | ICD-10-CM

## 2022-09-12 DIAGNOSIS — R7303 Prediabetes: Secondary | ICD-10-CM

## 2022-09-13 ENCOUNTER — Other Ambulatory Visit: Payer: Self-pay

## 2022-09-13 ENCOUNTER — Other Ambulatory Visit (HOSPITAL_COMMUNITY): Payer: Self-pay

## 2022-09-13 MED ORDER — SPIRONOLACTONE 25 MG PO TABS
25.0000 mg | ORAL_TABLET | Freq: Every day | ORAL | 3 refills | Status: DC
  Filled 2022-09-13: qty 90, 90d supply, fill #0
  Filled 2022-12-18 – 2022-12-19 (×2): qty 90, 90d supply, fill #1
  Filled 2023-03-25: qty 90, 90d supply, fill #2
  Filled 2023-06-27: qty 90, 90d supply, fill #3

## 2022-09-13 MED ORDER — EMPAGLIFLOZIN 10 MG PO TABS
10.0000 mg | ORAL_TABLET | Freq: Every day | ORAL | 11 refills | Status: DC
  Filled 2022-09-13: qty 30, 30d supply, fill #0
  Filled 2022-10-18: qty 30, 30d supply, fill #1
  Filled 2022-11-20: qty 30, 30d supply, fill #2
  Filled 2022-12-18 – 2022-12-19 (×2): qty 30, 30d supply, fill #3
  Filled 2023-01-21: qty 30, 30d supply, fill #4
  Filled 2023-02-23: qty 30, 30d supply, fill #5
  Filled 2023-03-25: qty 30, 30d supply, fill #6
  Filled 2023-06-03: qty 30, 30d supply, fill #8
  Filled 2023-06-27: qty 30, 30d supply, fill #9
  Filled 2023-07-29: qty 30, 30d supply, fill #10
  Filled 2023-09-04: qty 30, 30d supply, fill #11

## 2022-09-13 NOTE — Telephone Encounter (Signed)
Requested medication (s) are due for refill today:yes to both   Requested medication (s) are on the active medication list: yes to both   Last refill:  both meds last reordered 06/09/22 #90  Future visit scheduled: no   Notes to clinic:  Called pt and LM on VM to call back to make appt/pt is overdue lab work and appt   Requested Prescriptions  Pending Prescriptions Disp Refills   metFORMIN (GLUCOPHAGE) 500 MG tablet 90 tablet 0    Sig: Take 1 tablet (500 mg total) by mouth daily with breakfast.     Endocrinology:  Diabetes - Biguanides Failed - 09/12/2022 10:31 PM      Failed - Cr in normal range and within 360 days    Creatinine, Ser  Date Value Ref Range Status  09/05/2021 0.95 0.61 - 1.24 mg/dL Final         Failed - HBA1C is between 0 and 7.9 and within 180 days    Hgb A1c MFr Bld  Date Value Ref Range Status  09/05/2021 5.7 (H) 4.8 - 5.6 % Final    Comment:    (NOTE) Pre diabetes:          5.7%-6.4%  Diabetes:              >6.4%  Glycemic control for   <7.0% adults with diabetes          Failed - eGFR in normal range and within 360 days    GFR calc Af Amer  Date Value Ref Range Status  01/15/2020 89 >59 mL/min/1.73 Final    Comment:    **In accordance with recommendations from the NKF-ASN Task force,**   [facility] is in the process of updating its eGFR calculation to the   2021 CKD-EPI creatinine equation that estimates kidney function   without a race variable.    GFR, Estimated  Date Value Ref Range Status  09/05/2021 >60 >60 mL/min Final    Comment:    (NOTE) Calculated using the CKD-EPI Creatinine Equation (2021)    eGFR  Date Value Ref Range Status  06/07/2020 77 >59 mL/min/1.73 Final         Failed - B12 Level in normal range and within 720 days    No results found for: "VITAMINB12"       Failed - Valid encounter within last 6 months    Recent Outpatient Visits           11 months ago Right arm pain   [place] [facility], Lauris Poag, MD   1 year ago Heart failure with reduced ejection fraction, NYHA class II [facility])   [place] [employer] [facility], MD   1 year ago Prediabetes   [place] [facility], MD   2 years ago Encounter for annual physical exam   [place] [facility], Kandee Keen, MD   2 years ago Prediabetes   [facility], [place], [place]              Failed - CBC within normal limits and completed in the last 12 months    WBC  Date Value Ref Range Status  01/15/2020 6.3 3.4 - 10.8 x10E3/uL Final  10/22/2017 6.6 4.0 - 10.5 K/uL Final   RBC  Date Value Ref Range Status  01/15/2020 4.98 4.14 - 5.80 x10E6/uL Final  10/22/2017  4.74 4.22 - 5.81 MIL/uL Final   Hemoglobin  Date Value Ref Range Status  01/15/2020 15.0 13.0 - 17.7 g/dL Final   Total hemoglobin  Date Value Ref Range Status  07/14/2015 14.9 13.5 - 18.0 g/dL Final   Hematocrit  Date Value Ref Range Status  01/15/2020 45.3 37.5 - 51.0 % Final   MCHC  Date Value Ref Range Status  01/15/2020 33.1 31.5 - 35.7 g/dL Final  40/34/7425 95.6 30.0 - 36.0 g/dL Final   [facility]  Date Value Ref Range Status  01/15/2020 30.1 26.6 - 33.0 pg Final  10/22/2017 30.0 26.0 - 34.0 pg Final   MCV  Date Value Ref Range Status  01/15/2020 91 79 - 97 fL Final   No results found for: "PLTCOUNTKUC", "LABPLAT", "POCPLA" RDW  Date Value Ref Range Status  01/15/2020 12.4 11.6 - 15.4 % Final          pantoprazole (PROTONIX) 40 MG tablet 90 tablet 0    Sig: Take 1 tablet (40 mg total) by mouth daily.     Gastroenterology: Proton Pump Inhibitors Passed - 09/12/2022 10:31 PM      Passed - Valid encounter within last 12 months    Recent Outpatient Visits           11 months ago Right arm pain   [place] [facility], Lauris Poag, MD   1 year ago Heart failure with reduced  ejection fraction, NYHA class II [facility])   [place] [facility] at [facility], MD   1 year ago Prediabetes    [facility], MD   2 years ago Encounter for annual physical exam    [facility] at [facility], Kandee Keen, MD   2 years ago Prediabetes   [facility], [place], [place]

## 2022-09-14 ENCOUNTER — Other Ambulatory Visit (HOSPITAL_COMMUNITY): Payer: Self-pay

## 2022-09-15 ENCOUNTER — Ambulatory Visit (INDEPENDENT_AMBULATORY_CARE_PROVIDER_SITE_OTHER): Payer: 59 | Admitting: Family Medicine

## 2022-09-15 ENCOUNTER — Other Ambulatory Visit (HOSPITAL_COMMUNITY): Payer: Self-pay

## 2022-09-15 VITALS — BP 110/81 | HR 69 | Temp 97.7°F | Resp 16 | Ht 67.0 in | Wt 178.0 lb

## 2022-09-15 DIAGNOSIS — K219 Gastro-esophageal reflux disease without esophagitis: Secondary | ICD-10-CM | POA: Diagnosis not present

## 2022-09-15 DIAGNOSIS — R7303 Prediabetes: Secondary | ICD-10-CM | POA: Diagnosis not present

## 2022-09-15 LAB — POCT GLYCOSYLATED HEMOGLOBIN (HGB A1C): Hemoglobin A1C: 6.1 % — AB (ref 4.0–5.6)

## 2022-09-15 MED ORDER — PANTOPRAZOLE SODIUM 40 MG PO TBEC
40.0000 mg | DELAYED_RELEASE_TABLET | Freq: Every day | ORAL | 1 refills | Status: DC
  Filled 2022-09-15: qty 90, 90d supply, fill #0
  Filled 2022-12-18 – 2022-12-19 (×2): qty 90, 90d supply, fill #1

## 2022-09-15 MED ORDER — METFORMIN HCL 500 MG PO TABS
500.0000 mg | ORAL_TABLET | Freq: Every day | ORAL | 1 refills | Status: DC
  Filled 2022-09-15: qty 90, 90d supply, fill #0
  Filled 2022-12-18 – 2022-12-19 (×2): qty 90, 90d supply, fill #1

## 2022-09-15 NOTE — Progress Notes (Unsigned)
Patient is here for their 6 month follow-up Patient has no concerns today Care gaps have been discussed with patient  

## 2022-09-19 NOTE — Progress Notes (Signed)
Established Patient Office Visit  Subjective    Patient ID: Jerome Barnett, male    DOB: 1970/08/11  Age: 52 y.o. MRN: [id number]  CC:  Chief Complaint  Patient presents with   Follow-up   Medication Refill   Diabetes    HPI Jerome Barnett presents for routine follow up of chronic med issues. Patient denies acute complaints or concerns.    Outpatient Encounter Medications as of 09/15/2022  Medication Sig   aspirin 81 MG chewable tablet One pill by mouth daily   atorvastatin (LIPITOR) 80 MG tablet Take 1 tablet (80 mg total) by mouth daily.   carvedilol (COREG) 12.5 MG tablet Take 1 tablet by mouth 2 times daily with a meal.   Cetirizine HCl (ZYRTEC ALLERGY PO) Take 1 tablet by mouth daily as needed.   empagliflozin (JARDIANCE) 10 MG TABS tablet Take 1 tablet (10 mg total) by mouth daily before breakfast.   sacubitril-valsartan (ENTRESTO) 24-26 MG Take 1 tablet by mouth 2 (two) times daily.   spironolactone (ALDACTONE) 25 MG tablet Take 1 tablet (25 mg total) by mouth daily.   [DISCONTINUED] metFORMIN (GLUCOPHAGE) 500 MG tablet Take 1 tablet (500 mg total) by mouth daily with breakfast.   [DISCONTINUED] pantoprazole (PROTONIX) 40 MG tablet Take 1 tablet (40 mg total) by mouth daily.   metFORMIN (GLUCOPHAGE) 500 MG tablet Take 1 tablet (500 mg total) by mouth daily with breakfast.   methylPREDNISolone (MEDROL DOSEPAK) 4 MG TBPK tablet take as directed (Patient not taking: Reported on 09/15/2022)   pantoprazole (PROTONIX) 40 MG tablet Take 1 tablet (40 mg total) by mouth daily.   valACYclovir (VALTREX) 1000 MG tablet Take 1 tablet every 8 hours by oral route for 7 days. (Patient not taking: Reported on 09/15/2022)   No facility-administered encounter medications on file as of 09/15/2022.    Past Medical History:  Diagnosis Date   CHF (congestive heart failure) (HCC)     Past Surgical History:  Procedure Laterality Date   CARDIAC CATHETERIZATION N/A 07/05/2015    Procedure: Right/Left Heart Cath and Coronary Angiography;  Surgeon: Dolores Patty, MD;  Location: [facility] INVASIVE CV LAB;  Service: Cardiovascular;  Laterality: N/A;    Family History  Problem Relation Age of Onset   Hyperlipidemia Mother    Hypertension Father    Hyperlipidemia Father    Irritable bowel syndrome Father     Social History   Socioeconomic History   Marital status: Divorced    Spouse name: Not on file   Number of children: Not on file   Years of education: Not on file   Highest education level: Bachelor's degree (e.g., BA, AB, BS)  Occupational History   Not on file  Tobacco Use   Smoking status: Former    Packs/day: 2.00    Years: 20.00    Additional pack years: 0.00    Total pack years: 40.00    Types: Cigarettes    Quit date: 03/20/2012    Years since quitting: 10.5   Smokeless tobacco: Never  Vaping Use   Vaping Use: Never used  Substance and Sexual Activity   Alcohol use: No    Alcohol/week: 0.0 standard drinks of alcohol   Drug use: No   Sexual activity: Not on file  Other Topics Concern   Not on file  Social History Narrative   Patient is separated., no children.   Has pets   Works full time at [employer]  Financial Resource Strain: Low Risk  (09/15/2022)   Overall Financial Resource Strain (CARDIA)    Difficulty of Paying Living Expenses: Not hard at all  Food Insecurity: No Food Insecurity (09/15/2022)   Hunger Vital Sign    Worried About Running Out of Food in the Last Year: Never true    Ran Out of Food in the Last Year: Never true  Transportation Needs: No Transportation Needs (09/15/2022)   PRAPARE - Administrator, Civil Service (Medical): No    Lack of Transportation (Non-Medical): No  Physical Activity: Unknown (09/15/2022)   Exercise Vital Sign    Days of Exercise per Week: 0 days    Minutes of Exercise per Session: Not on file  Stress: No Stress Concern Present (09/15/2022)    [employer] of Occupational Health - Occupational Stress Questionnaire    Feeling of Stress : Not at all  Social Connections: Unknown (09/15/2022)   Social Connection and Isolation Panel [NHANES]    Frequency of Communication with Friends and Family: Patient declined    Frequency of Social Gatherings with Friends and Family: Twice a week    Attends Religious Services: Patient declined    Database administrator or Organizations: No    Attends Engineer, structural: Not on file    Marital Status: Divorced  Intimate Partner Violence: Not on file    Review of Systems  All other systems reviewed and are negative.       Objective    BP 110/81   Pulse 69   Temp 97.7 F (36.5 C) (Oral)   Resp 16   Ht 5\' 7"  (1.702 m)   Wt 178 lb (80.7 kg)   SpO2 96%   BMI 27.88 kg/m   Physical Exam Vitals and nursing note reviewed.  Constitutional:      General: He is not in acute distress. Cardiovascular:     Rate and Rhythm: Normal rate and regular rhythm.  Pulmonary:     Effort: Pulmonary effort is normal.     Breath sounds: Normal breath sounds.  Abdominal:     Palpations: Abdomen is soft.     Tenderness: There is no abdominal tenderness.  Neurological:     General: No focal deficit present.     Mental Status: He is alert and oriented to person, place, and time.         Assessment & Plan:   1. Prediabetes Slightly increased A1c but still at goal. continue - POCT glycosylated hemoglobin (Hb A1C) - metFORMIN (GLUCOPHAGE) 500 MG tablet; Take 1 tablet (500 mg total) by mouth daily with breakfast.  Dispense: 90 tablet; Refill: 1  2. Gastroesophageal reflux disease without esophagitis Discussed dietary and activity options. Continue  - pantoprazole (PROTONIX) 40 MG tablet; Take 1 tablet (40 mg total) by mouth daily.  Dispense: 90 tablet; Refill: 1    Return in about 6 months (around 03/17/2023) for follow up.   Tommie Raymond, MD

## 2022-10-18 ENCOUNTER — Other Ambulatory Visit: Payer: Self-pay | Admitting: Family Medicine

## 2022-10-18 DIAGNOSIS — E785 Hyperlipidemia, unspecified: Secondary | ICD-10-CM

## 2022-10-19 ENCOUNTER — Other Ambulatory Visit: Payer: Self-pay

## 2022-10-19 ENCOUNTER — Other Ambulatory Visit (HOSPITAL_COMMUNITY): Payer: Self-pay

## 2022-10-19 MED ORDER — ATORVASTATIN CALCIUM 80 MG PO TABS
80.0000 mg | ORAL_TABLET | Freq: Every day | ORAL | 0 refills | Status: DC
  Filled 2022-10-19: qty 90, 90d supply, fill #0

## 2022-11-20 ENCOUNTER — Other Ambulatory Visit: Payer: Self-pay | Admitting: Family Medicine

## 2022-11-20 ENCOUNTER — Other Ambulatory Visit (HOSPITAL_COMMUNITY): Payer: Self-pay | Admitting: Internal Medicine

## 2022-11-20 DIAGNOSIS — I5020 Unspecified systolic (congestive) heart failure: Secondary | ICD-10-CM

## 2022-11-21 ENCOUNTER — Other Ambulatory Visit (HOSPITAL_COMMUNITY): Payer: Self-pay

## 2022-11-21 ENCOUNTER — Other Ambulatory Visit: Payer: Self-pay

## 2022-11-21 MED ORDER — ENTRESTO 24-26 MG PO TABS
1.0000 | ORAL_TABLET | Freq: Two times a day (BID) | ORAL | 1 refills | Status: DC
  Filled 2022-11-21: qty 60, 30d supply, fill #0
  Filled 2022-12-18 – 2022-12-19 (×2): qty 60, 30d supply, fill #1

## 2022-11-21 MED ORDER — CARVEDILOL 12.5 MG PO TABS
12.5000 mg | ORAL_TABLET | Freq: Two times a day (BID) | ORAL | 0 refills | Status: DC
  Filled 2022-11-21: qty 180, 90d supply, fill #0

## 2022-12-18 ENCOUNTER — Other Ambulatory Visit (HOSPITAL_COMMUNITY): Payer: Self-pay

## 2022-12-19 ENCOUNTER — Other Ambulatory Visit: Payer: Self-pay

## 2023-01-21 ENCOUNTER — Other Ambulatory Visit: Payer: Self-pay | Admitting: Family Medicine

## 2023-01-21 ENCOUNTER — Other Ambulatory Visit (HOSPITAL_COMMUNITY): Payer: Self-pay | Admitting: Internal Medicine

## 2023-01-21 DIAGNOSIS — I5020 Unspecified systolic (congestive) heart failure: Secondary | ICD-10-CM

## 2023-01-21 DIAGNOSIS — E785 Hyperlipidemia, unspecified: Secondary | ICD-10-CM

## 2023-01-22 ENCOUNTER — Other Ambulatory Visit: Payer: 59

## 2023-01-22 ENCOUNTER — Other Ambulatory Visit (HOSPITAL_COMMUNITY): Payer: Self-pay

## 2023-01-22 ENCOUNTER — Other Ambulatory Visit: Payer: Self-pay

## 2023-01-22 DIAGNOSIS — E785 Hyperlipidemia, unspecified: Secondary | ICD-10-CM

## 2023-01-22 MED ORDER — ATORVASTATIN CALCIUM 80 MG PO TABS
80.0000 mg | ORAL_TABLET | Freq: Every day | ORAL | 0 refills | Status: DC
  Filled 2023-01-22: qty 90, 90d supply, fill #0

## 2023-01-22 NOTE — Telephone Encounter (Signed)
Patient advised to come to office for labs.  Medication sent to pharmacy.

## 2023-01-23 ENCOUNTER — Other Ambulatory Visit: Payer: Self-pay

## 2023-01-23 LAB — LIPID PANEL
Chol/HDL Ratio: 3.9 ratio (ref 0.0–5.0)
Cholesterol, Total: 159 mg/dL (ref 100–199)
HDL: 41 mg/dL (ref >39)
LDL Chol Calc (NIH): 78 mg/dL (ref 0–99)
Triglycerides: 245 mg/dL — ABNORMAL HIGH (ref 0–149)
VLDL Cholesterol Cal: 40 mg/dL (ref 5–40)

## 2023-01-29 ENCOUNTER — Other Ambulatory Visit (HOSPITAL_COMMUNITY): Payer: Self-pay

## 2023-01-29 MED ORDER — ENTRESTO 24-26 MG PO TABS
1.0000 | ORAL_TABLET | Freq: Two times a day (BID) | ORAL | 1 refills | Status: DC
  Filled 2023-01-29: qty 60, 30d supply, fill #0
  Filled 2023-02-23: qty 60, 30d supply, fill #1

## 2023-02-23 ENCOUNTER — Other Ambulatory Visit: Payer: Self-pay | Admitting: Family Medicine

## 2023-02-26 ENCOUNTER — Other Ambulatory Visit (HOSPITAL_COMMUNITY): Payer: Self-pay

## 2023-02-26 ENCOUNTER — Other Ambulatory Visit: Payer: Self-pay

## 2023-02-28 ENCOUNTER — Other Ambulatory Visit (HOSPITAL_COMMUNITY): Payer: Self-pay

## 2023-02-28 ENCOUNTER — Other Ambulatory Visit: Payer: Self-pay

## 2023-02-28 MED ORDER — CARVEDILOL 12.5 MG PO TABS
12.5000 mg | ORAL_TABLET | Freq: Two times a day (BID) | ORAL | 0 refills | Status: DC
  Filled 2023-02-28: qty 180, 90d supply, fill #0

## 2023-03-25 ENCOUNTER — Other Ambulatory Visit (HOSPITAL_COMMUNITY): Payer: Self-pay

## 2023-03-25 ENCOUNTER — Other Ambulatory Visit: Payer: Self-pay | Admitting: Family Medicine

## 2023-03-25 ENCOUNTER — Other Ambulatory Visit (HOSPITAL_COMMUNITY): Payer: Self-pay | Admitting: Internal Medicine

## 2023-03-25 DIAGNOSIS — K219 Gastro-esophageal reflux disease without esophagitis: Secondary | ICD-10-CM

## 2023-03-25 DIAGNOSIS — R7303 Prediabetes: Secondary | ICD-10-CM

## 2023-03-25 DIAGNOSIS — I5020 Unspecified systolic (congestive) heart failure: Secondary | ICD-10-CM

## 2023-03-26 ENCOUNTER — Other Ambulatory Visit: Payer: Self-pay

## 2023-03-27 ENCOUNTER — Other Ambulatory Visit: Payer: Self-pay

## 2023-03-27 ENCOUNTER — Other Ambulatory Visit (HOSPITAL_COMMUNITY): Payer: Self-pay

## 2023-03-27 MED ORDER — ENTRESTO 24-26 MG PO TABS
1.0000 | ORAL_TABLET | Freq: Two times a day (BID) | ORAL | 1 refills | Status: DC
  Filled 2023-03-27: qty 60, 30d supply, fill #0
  Filled 2023-04-29: qty 60, 30d supply, fill #1

## 2023-03-30 ENCOUNTER — Other Ambulatory Visit: Payer: Self-pay | Admitting: Family Medicine

## 2023-03-30 ENCOUNTER — Ambulatory Visit: Payer: 59 | Admitting: Family Medicine

## 2023-03-30 VITALS — BP 118/67 | HR 75 | Temp 97.8°F | Resp 16 | Wt 180.0 lb

## 2023-03-30 DIAGNOSIS — Z7984 Long term (current) use of oral hypoglycemic drugs: Secondary | ICD-10-CM

## 2023-03-30 DIAGNOSIS — R7303 Prediabetes: Secondary | ICD-10-CM

## 2023-03-30 DIAGNOSIS — E785 Hyperlipidemia, unspecified: Secondary | ICD-10-CM

## 2023-03-30 DIAGNOSIS — I10 Essential (primary) hypertension: Secondary | ICD-10-CM

## 2023-03-30 DIAGNOSIS — Z23 Encounter for immunization: Secondary | ICD-10-CM

## 2023-03-30 DIAGNOSIS — K219 Gastro-esophageal reflux disease without esophagitis: Secondary | ICD-10-CM

## 2023-03-30 LAB — POCT GLYCOSYLATED HEMOGLOBIN (HGB A1C): Hemoglobin A1C: 6.2 % — AB (ref 4.0–5.6)

## 2023-04-01 LAB — RENAL FUNCTION PANEL
Albumin: 4.8 g/dL (ref 3.8–4.9)
BUN/Creatinine Ratio: 11 (ref 9–20)
BUN: 13 mg/dL (ref 6–24)
CO2: 25 mmol/L (ref 20–29)
Calcium: 9.4 mg/dL (ref 8.7–10.2)
Chloride: 102 mmol/L (ref 96–106)
Creatinine, Ser: 1.14 mg/dL (ref 0.76–1.27)
Glucose: 120 mg/dL — ABNORMAL HIGH (ref 70–99)
Phosphorus: 2.9 mg/dL (ref 2.8–4.1)
Potassium: 4.5 mmol/L (ref 3.5–5.2)
Sodium: 141 mmol/L (ref 134–144)
eGFR: 76 mL/min/{1.73_m2} (ref >59)

## 2023-04-01 LAB — MICROALBUMIN / CREATININE URINE RATIO
Creatinine, Urine: 86.5 mg/dL
Microalb/Creat Ratio: 12 mg/g{creat} (ref 0–29)
Microalbumin, Urine: 10.3 ug/mL

## 2023-04-02 ENCOUNTER — Other Ambulatory Visit: Payer: Self-pay

## 2023-04-02 ENCOUNTER — Encounter: Payer: Self-pay | Admitting: Family Medicine

## 2023-04-02 MED ORDER — PANTOPRAZOLE SODIUM 40 MG PO TBEC
40.0000 mg | DELAYED_RELEASE_TABLET | Freq: Every day | ORAL | 1 refills | Status: DC
  Filled 2023-04-02: qty 90, 90d supply, fill #0
  Filled 2023-06-27: qty 90, 90d supply, fill #1

## 2023-04-02 MED ORDER — METFORMIN HCL 500 MG PO TABS
500.0000 mg | ORAL_TABLET | Freq: Every day | ORAL | 1 refills | Status: DC
  Filled 2023-04-02: qty 90, 90d supply, fill #0
  Filled 2023-06-27: qty 90, 90d supply, fill #1

## 2023-04-02 NOTE — Progress Notes (Signed)
 Established Patient Office Visit  Subjective    Patient ID: Jerome Barnett, male    DOB: 12-13-1970  Age: 53 y.o. MRN: [id number]  CC:  Chief Complaint  Patient presents with   Medical Management of Chronic Issues    HPI Jerome Barnett presents for routine follow up of hypertension, hyperlipidemia, and prediabetes. Patient reports no acute complaints.   Outpatient Encounter Medications as of 03/30/2023  Medication Sig   aspirin  81 MG chewable tablet One pill by mouth daily   atorvastatin  (LIPITOR ) 80 MG tablet Take 1 tablet (80 mg total) by mouth daily.   carvedilol  (COREG ) 12.5 MG tablet Take 1 tablet (12.5 mg total) by mouth 2 (two) times daily with a meal. Please call to schedule appointment for refills.   Cetirizine HCl (ZYRTEC ALLERGY PO) Take 1 tablet by mouth daily as needed.   empagliflozin  (JARDIANCE ) 10 MG TABS tablet Take 1 tablet (10 mg total) by mouth daily before breakfast.   metFORMIN  (GLUCOPHAGE ) 500 MG tablet Take 1 tablet (500 mg total) by mouth daily with breakfast.   pantoprazole  (PROTONIX ) 40 MG tablet Take 1 tablet (40 mg total) by mouth daily.   sacubitril -valsartan  (ENTRESTO ) 24-26 MG Take 1 tablet by mouth 2 (two) times daily. PLEASE SCHEDULED APPOINTMENT FOR FURTHER REFILLS.   spironolactone  (ALDACTONE ) 25 MG tablet Take 1 tablet (25 mg total) by mouth daily.   valACYclovir (VALTREX) 1000 MG tablet    methylPREDNISolone  (MEDROL  DOSEPAK) 4 MG TBPK tablet take as directed (Patient not taking: Reported on 03/30/2023)   No facility-administered encounter medications on file as of 03/30/2023.    Past Medical History:  Diagnosis Date   CHF (congestive heart failure) (HCC)     Past Surgical History:  Procedure Laterality Date   CARDIAC CATHETERIZATION N/A 07/05/2015   Procedure: Right/Left Heart Cath and Coronary Angiography;  Surgeon: Toribio JONELLE Fuel, MD;  Location: [facility] INVASIVE CV LAB;  Service: Cardiovascular;  Laterality: N/A;    Family  History  Problem Relation Age of Onset   Hyperlipidemia Mother    Hypertension Father    Hyperlipidemia Father    Irritable bowel syndrome Father     Social History   Socioeconomic History   Marital status: Divorced    Spouse name: Not on file   Number of children: Not on file   Years of education: Not on file   Highest education level: Bachelor's degree (e.g., BA, AB, BS)  Occupational History   Not on file  Tobacco Use   Smoking status: Former    Current packs/day: 0.00    Average packs/day: 2.0 packs/day for 20.0 years (40.0 ttl pk-yrs)    Types: Cigarettes    Start date: 03/20/1992    Quit date: 03/20/2012    Years since quitting: 11.0   Smokeless tobacco: Never  Vaping Use   Vaping status: Never Used  Substance and Sexual Activity   Alcohol use: No    Alcohol/week: 0.0 standard drinks of alcohol   Drug use: No   Sexual activity: Not on file  Other Topics Concern   Not on file  Social History Narrative   Patient is separated., no children.   Has pets   Works full time at [employer]    Social Drivers of [employer]: Low Risk  (03/29/2023)   Overall Financial Resource Strain (CARDIA)    Difficulty of Paying Living Expenses: Not hard at all  Food Insecurity: No Food Insecurity (03/29/2023)   Hunger Vital Sign  Worried About Programme Researcher, Broadcasting/film/video in the Last Year: Never true    Ran Out of Food in the Last Year: Never true  Transportation Needs: No Transportation Needs (03/29/2023)   PRAPARE - Administrator, Civil Service (Medical): No    Lack of Transportation (Non-Medical): No  Physical Activity: Unknown (03/29/2023)   Exercise Vital Sign    Days of Exercise per Week: 0 days    Minutes of Exercise per Session: Not on file  Stress: No Stress Concern Present (03/29/2023)   [employer] of Occupational Health - Occupational Stress Questionnaire    Feeling of Stress : Not at all  Social Connections: Moderately Isolated  (03/29/2023)   Social Connection and Isolation Panel [NHANES]    Frequency of Communication with Friends and Family: Three times a week    Frequency of Social Gatherings with Friends and Family: Three times a week    Attends Religious Services: 1 to 4 times per year    Active Member of Clubs or Organizations: No    Attends Engineer, Structural: Not on file    Marital Status: Divorced  Intimate Partner Violence: Not on file    Review of Systems  All other systems reviewed and are negative.       Objective    BP 118/67   Pulse 75   Temp 97.8 F (36.6 C) (Oral)   Resp 16   Wt 180 lb (81.6 kg)   SpO2 93%   BMI 28.19 kg/m   Physical Exam Vitals and nursing note reviewed.  Constitutional:      General: He is not in acute distress. Cardiovascular:     Rate and Rhythm: Normal rate and regular rhythm.  Pulmonary:     Effort: Pulmonary effort is normal.     Breath sounds: Normal breath sounds.  Abdominal:     Palpations: Abdomen is soft.     Tenderness: There is no abdominal tenderness.  Neurological:     General: No focal deficit present.     Mental Status: He is alert and oriented to person, place, and time.         Assessment & Plan:   Essential hypertension  Hyperlipidemia, unspecified hyperlipidemia type  Prediabetes -     Renal function panel -     POCT glycosylated hemoglobin (Hb A1C) -     Microalbumin / creatinine urine ratio  Need for shingles vaccine -     Varicella-zoster vaccine IM  Encounter for immunization -     Flu vaccine trivalent PF, 6mos and older(Flulaval,Afluria,Fluarix,Fluzone)     Return in about 6 months (around 09/27/2023) for follow up.   Tanda Raguel SQUIBB, MD

## 2023-04-29 ENCOUNTER — Other Ambulatory Visit: Payer: Self-pay | Admitting: Family Medicine

## 2023-04-29 DIAGNOSIS — E785 Hyperlipidemia, unspecified: Secondary | ICD-10-CM

## 2023-04-30 ENCOUNTER — Other Ambulatory Visit (HOSPITAL_COMMUNITY): Payer: Self-pay

## 2023-04-30 ENCOUNTER — Other Ambulatory Visit: Payer: Self-pay

## 2023-04-30 MED ORDER — ATORVASTATIN CALCIUM 80 MG PO TABS
80.0000 mg | ORAL_TABLET | Freq: Every day | ORAL | 0 refills | Status: DC
  Filled 2023-04-30: qty 90, 90d supply, fill #0

## 2023-05-22 ENCOUNTER — Other Ambulatory Visit: Payer: Self-pay | Admitting: Family Medicine

## 2023-06-04 ENCOUNTER — Other Ambulatory Visit: Payer: Self-pay | Admitting: Family Medicine

## 2023-06-04 ENCOUNTER — Other Ambulatory Visit (HOSPITAL_COMMUNITY): Payer: Self-pay | Admitting: Internal Medicine

## 2023-06-04 ENCOUNTER — Other Ambulatory Visit (HOSPITAL_COMMUNITY): Payer: Self-pay

## 2023-06-04 DIAGNOSIS — I5020 Unspecified systolic (congestive) heart failure: Secondary | ICD-10-CM

## 2023-06-04 MED ORDER — CARVEDILOL 12.5 MG PO TABS
12.5000 mg | ORAL_TABLET | Freq: Two times a day (BID) | ORAL | 0 refills | Status: DC
  Filled 2023-06-04: qty 180, 90d supply, fill #0

## 2023-06-04 NOTE — Telephone Encounter (Signed)
 Requested Prescriptions  Pending Prescriptions Disp Refills   carvedilol (COREG) 12.5 MG tablet 180 tablet 0    Sig: Take 1 tablet (12.5 mg total) by mouth 2 (two) times daily with a meal.     Cardiovascular: Beta Blockers 3 Failed - 06/04/2023  4:26 PM      Failed - AST in normal range and within 360 days    AST  Date Value Ref Range Status  09/05/2021 20 15 - 41 U/L Final         Failed - ALT in normal range and within 360 days    ALT  Date Value Ref Range Status  09/05/2021 34 0 - 44 U/L Final         Passed - Cr in normal range and within 360 days    Creatinine, Ser  Date Value Ref Range Status  03/30/2023 1.14 0.76 - 1.27 mg/dL Final         Passed - Last BP in normal range    BP Readings from Last 1 Encounters:  03/30/23 118/67         Passed - Last Heart Rate in normal range    Pulse Readings from Last 1 Encounters:  03/30/23 75         Passed - Valid encounter within last 6 months    Recent Outpatient Visits           2 months ago Essential hypertension   [place] [facility], MD   8 months ago Prediabetes   [place] [facility], MD   1 year ago Right arm pain   [place] [facility], Lauris Poag, MD   1 year ago Heart failure with reduced ejection fraction, NYHA class II [facility])    [facility], MD   2 years ago Prediabetes    [facility] at [facility], MD

## 2023-06-07 ENCOUNTER — Other Ambulatory Visit (HOSPITAL_COMMUNITY): Payer: Self-pay

## 2023-06-07 ENCOUNTER — Other Ambulatory Visit: Payer: Self-pay

## 2023-06-07 DIAGNOSIS — I5022 Chronic systolic (congestive) heart failure: Secondary | ICD-10-CM

## 2023-06-07 MED ORDER — ENTRESTO 24-26 MG PO TABS
1.0000 | ORAL_TABLET | Freq: Two times a day (BID) | ORAL | 0 refills | Status: DC
  Filled 2023-06-07: qty 60, 30d supply, fill #0

## 2023-06-27 ENCOUNTER — Other Ambulatory Visit (HOSPITAL_COMMUNITY): Payer: Self-pay | Admitting: Internal Medicine

## 2023-06-27 DIAGNOSIS — I5020 Unspecified systolic (congestive) heart failure: Secondary | ICD-10-CM

## 2023-06-28 ENCOUNTER — Other Ambulatory Visit: Payer: Self-pay

## 2023-06-29 ENCOUNTER — Other Ambulatory Visit: Payer: Self-pay

## 2023-06-29 ENCOUNTER — Other Ambulatory Visit (HOSPITAL_COMMUNITY): Payer: Self-pay

## 2023-06-29 MED ORDER — ENTRESTO 24-26 MG PO TABS
1.0000 | ORAL_TABLET | Freq: Two times a day (BID) | ORAL | 0 refills | Status: DC
  Filled 2023-06-29 – 2023-07-04 (×2): qty 60, 30d supply, fill #0

## 2023-07-05 ENCOUNTER — Other Ambulatory Visit (HOSPITAL_COMMUNITY): Payer: Self-pay

## 2023-07-20 ENCOUNTER — Ambulatory Visit (HOSPITAL_COMMUNITY)
Admission: RE | Admit: 2023-07-20 | Discharge: 2023-07-20 | Disposition: A | Discharge status: Home / Self Care | Source: Ambulatory Visit | Attending: Family Medicine | Admitting: Family Medicine

## 2023-07-20 ENCOUNTER — Encounter (HOSPITAL_COMMUNITY): Payer: Self-pay | Admitting: Internal Medicine

## 2023-07-20 ENCOUNTER — Ambulatory Visit (HOSPITAL_BASED_OUTPATIENT_CLINIC_OR_DEPARTMENT_OTHER)
Admission: RE | Admit: 2023-07-20 | Discharge: 2023-07-20 | Disposition: A | Discharge status: Home / Self Care | Source: Ambulatory Visit | Attending: Internal Medicine | Admitting: Internal Medicine

## 2023-07-20 VITALS — BP 102/70 | HR 68 | Wt 176.0 lb

## 2023-07-20 DIAGNOSIS — E119 Type 2 diabetes mellitus without complications: Secondary | ICD-10-CM

## 2023-07-20 DIAGNOSIS — I5022 Chronic systolic (congestive) heart failure: Secondary | ICD-10-CM | POA: Diagnosis present

## 2023-07-20 DIAGNOSIS — Z7984 Long term (current) use of oral hypoglycemic drugs: Secondary | ICD-10-CM | POA: Insufficient documentation

## 2023-07-20 DIAGNOSIS — K219 Gastro-esophageal reflux disease without esophagitis: Secondary | ICD-10-CM | POA: Insufficient documentation

## 2023-07-20 DIAGNOSIS — Z79899 Other long term (current) drug therapy: Secondary | ICD-10-CM | POA: Insufficient documentation

## 2023-07-20 DIAGNOSIS — I10 Essential (primary) hypertension: Secondary | ICD-10-CM | POA: Diagnosis not present

## 2023-07-20 DIAGNOSIS — E785 Hyperlipidemia, unspecified: Secondary | ICD-10-CM | POA: Diagnosis not present

## 2023-07-20 DIAGNOSIS — I110 Hypertensive heart disease with heart failure: Secondary | ICD-10-CM | POA: Insufficient documentation

## 2023-07-20 DIAGNOSIS — Z87891 Personal history of nicotine dependence: Secondary | ICD-10-CM | POA: Insufficient documentation

## 2023-07-20 LAB — ECHOCARDIOGRAM COMPLETE
Area-P 1/2: 3.08 cm2
Calc EF: 50.8 %
S' Lateral: 3.4 cm
Single Plane A2C EF: 49.8 %
Single Plane A4C EF: 51.6 %

## 2023-07-20 NOTE — Addendum Note (Signed)
 Encounter addended by: Glorietta Lark, RN on: 07/20/2023 9:19 AM  Actions taken: Clinical Note Signed

## 2023-07-20 NOTE — Addendum Note (Signed)
 Encounter addended by: Glorietta Lark, RN on: 07/20/2023 9:18 AM  Actions taken: Order list changed, Diagnosis association updated

## 2023-07-20 NOTE — Progress Notes (Signed)
 Patient ID: Jerome Barnett, male   DOB: [birth year], 52 y.o.   MRN: [id number]    [facility]  NOTE  PCP: Abraham Abo, MD HF MD: Dr. Julane Ny   HPI: Jerome Barnett is a 53 y.o. male with h/o HL, HTN and GERD who was admitted in 4/17 with acute HF. EF 15%.  Underwent cath in 4/17 normal cors with low filling pressures and normal cardiac output. Procedure complicated by radial artery spasm requiring multiple rounds of verapamil  and NTG. Patient then developed shock and was supported with norepi and milrinone transiently. Was discharged home and then readmitted several days later with hypotension in setting of volume depletion due to severe restriction of po intake.    Seen in ED 08/03/15 and found to be orthostatic. Symptoms improved with 500 cc of IVF.   Admitted 10/22/17 with increased dyspnea. Echo performed and showed EF 45-50% (improved) . Troponin was negative and BNP was not elevated so he was discharged   Was seen in HF Clinic 8/16 with palpitations and dizziness. Around that time was getting separated from his wife and had to put dog down.   Zio patch 9/19 1. Sinus rhythm with rare PACs and PVCs. 2. No significant arrhythmias 3. Diary events of dizziness occasionaly correspond to PVCs but also triggered with NSR  Follow up 8/22, NYHA I, volume good. Repeat echo ordered.  Acute visit 6/23 with SOB and atypical chest pain. SGLT2i started and echo repeated, showing EF stable at 50%.  Today he returns for HF follow up. Says he is back to mountain biking.at least once a week for an hour on trails but this time of year feels more SOB. Thinks it may be pollen. Compliant with meds.   Echo today 07/20/23 EF 50-55%.   Cardiac Studies  - Echo (6/23): EF 50%, RV ok - Echo (2/20): EF 55-60% RV mild HK  - Echo (8/19): EF 45-50%, diffuse HK - Echo (4/17): EF 10-15%, normal RV - Echo 10/17): EF ~40% normal RV - cMRI  (3/18): LVEF 43% RVEF 41% NICM  - RHC/LHC (4/17):  RA = 2 RV  = 28/0/4 PA = 28/11 (18) PCW = 5 Fick cardiac output/index = 4.3/2.3 PVR = 3.0 WU FA sat = 94% PA sat = 60%, 63% 1. Normal coronary arteries 2. Low filling pressures with normal cardiac output 3. Severe NICM with EF 10% by echo 3. Development of severe hypotension and shock in cath lab due to vasodilators to treat radial artery spasm  ROS: All systems reviewed and negative except as per HPI.  Current Outpatient Medications  Medication Sig Dispense Refill   aspirin  81 MG chewable tablet One pill by mouth daily 30 tablet 11   atorvastatin  (LIPITOR ) 80 MG tablet Take 1 tablet (80 mg total) by mouth daily. 90 tablet 0   carvedilol  (COREG ) 12.5 MG tablet Take 1 tablet (12.5 mg total) by mouth 2 (two) times daily with a meal. 180 tablet 0   Cetirizine HCl (ZYRTEC ALLERGY PO) Take 1 tablet by mouth daily as needed.     empagliflozin  (JARDIANCE ) 10 MG TABS tablet Take 1 tablet (10 mg total) by mouth daily before breakfast. 30 tablet 11   metFORMIN  (GLUCOPHAGE ) 500 MG tablet Take 1 tablet (500 mg total) by mouth daily with breakfast. 90 tablet 1   pantoprazole  (PROTONIX ) 40 MG tablet Take 1 tablet (40 mg total) by mouth daily. 90 tablet 1   sacubitril -valsartan  (ENTRESTO ) 24-26 MG Take 1 tablet by mouth 2 (two)  times daily. PLEASE SCHEDULED APPOINTMENT FOR FURTHER REFILLS. 60 tablet 0   spironolactone  (ALDACTONE ) 25 MG tablet Take 1 tablet (25 mg total) by mouth daily. 90 tablet 3   No current facility-administered medications for this encounter.   Allergies  Allergen Reactions   Amoxicillin Swelling    Eye swelling   Lasix  [Furosemide ] Swelling    Eye swelling   Social History   Socioeconomic History   Marital status: Divorced    Spouse name: Not on file   Number of children: Not on file   Years of education: Not on file   Highest education level: Bachelor's degree (e.g., BA, AB, BS)  Occupational History   Not on file  Tobacco Use   Smoking status: Former    Current packs/day:  0.00    Average packs/day: 2.0 packs/day for 20.0 years (40.0 ttl pk-yrs)    Types: Cigarettes    Start date: 03/20/1992    Quit date: 03/20/2012    Years since quitting: 11.3   Smokeless tobacco: Never  Vaping Use   Vaping status: Never Used  Substance and Sexual Activity   Alcohol use: No    Alcohol/week: 0.0 standard drinks of alcohol   Drug use: No   Sexual activity: Not on file  Other Topics Concern   Not on file  Social History Narrative   Patient is separated., no children.   Has pets   Works full time at [employer]    Social Drivers of [employer]: Low Risk  (03/29/2023)   Overall Financial Resource Strain (CARDIA)    Difficulty of Paying Living Expenses: Not hard at all  Food Insecurity: No Food Insecurity (03/29/2023)   Hunger Vital Sign    Worried About Running Out of Food in the Last Year: Never true    Ran Out of Food in the Last Year: Never true  Transportation Needs: No Transportation Needs (03/29/2023)   PRAPARE - Administrator, Civil Service (Medical): No    Lack of Transportation (Non-Medical): No  Physical Activity: Unknown (03/29/2023)   Exercise Vital Sign    Days of Exercise per Week: 0 days    Minutes of Exercise per Session: Not on file  Stress: No Stress Concern Present (03/29/2023)   [employer] of Occupational Health - Occupational Stress Questionnaire    Feeling of Stress : Not at all  Social Connections: Moderately Isolated (03/29/2023)   Social Connection and Isolation Panel [NHANES]    Frequency of Communication with Friends and Family: Three times a week    Frequency of Social Gatherings with Friends and Family: Three times a week    Attends Religious Services: 1 to 4 times per year    Active Member of Clubs or Organizations: No    Attends Engineer, structural: Not on file    Marital Status: Divorced  Catering manager Violence: Not on file   Family History  Problem Relation Age of Onset    Hyperlipidemia Mother    Hypertension Father    Hyperlipidemia Father    Irritable bowel syndrome Father    BP 102/70   Pulse 68   Wt 79.8 kg (176 lb)   SpO2 99%   BMI 27.57 kg/m   Wt Readings from Last 3 Encounters:  07/20/23 79.8 kg (176 lb)  03/30/23 81.6 kg (180 lb)  09/15/22 80.7 kg (178 lb)    PHYSICAL EXAM: General:  Well appearing. No resp difficulty HEENT: normal Neck: supple.  no JVD. Carotids 2+ bilat; no bruits. No lymphadenopathy or thryomegaly appreciated. Cor: PMI nondisplaced. Regular rate & rhythm. No rubs, gallops or murmurs. Lungs: clear Abdomen: soft, nontender, nondistended. No hepatosplenomegaly. No bruits or masses. Good bowel sounds. Extremities: no cyanosis, clubbing, rash, edema Neuro: alert & orientedx3, cranial nerves grossly intact. moves all 4 extremities w/o difficulty. Affect pleasant  ECG: NSR 65 + LVH No ST-T wave abnormalities. Personally reviewed   ASSESSMENT & PLAN:  1. Chronic systolic heart failure  - cath (4/17): with normal coronaries. EF 15%. ? Viral CM. - Echo (10/17): with improvement of EF ~40%.  - Bedside Echo (2/18): EF 25-30% range.  - cMRI (3/18) LVEF 43% RV normal  - Echo (8/19): EF 45-50%  - Echo (2/20): EF 55-60% RV mild HK  - Echo (6/23) EF 50%, global hypokinesis, RV ok - Echo today 07/20/23 EF 50-55%. Feels ok  - Stable NYHA I-II Volume ok  - Continue Jardiance  10 mg daily. - Continue spiro 25 mg daily.  - Continue Entresto  24/26 mg bid.    - Continue carvedilol  12.5 mg bid.   2. H/o Chest pain, atypical - Cath (4/17) with no CAD. - Will check CAC to look for porgression given CRFs  3. HLD - Cont atorvastatin  80 mg. PCP following - LDL 01/22/23 = 78  4. HTN - Blood pressure well controlled. Continue current regimen.  5. DM2 - on metformin . Followed by PCP - Continue SGLT2i. - A1c 6.2%  - A1C 5.7 (6/23)  Jules Oar, MD  9:01 AM

## 2023-07-20 NOTE — Patient Instructions (Signed)
 Dr Julane Ny has ordered a CT coronary calcium  score.   Test locations:  [facility] gallery [place] at [facility] at [facility]  This is $99 out of pocket.   Coronary CalciumScan A coronary calcium  scan is an imaging test used to look for deposits of calcium  and other fatty materials (plaques) in the inner lining of the blood vessels of the heart (coronary arteries). These deposits of calcium  and plaques can partly clog and narrow the coronary arteries without producing any symptoms or warning signs. This puts a person at risk for a heart attack. This test can detect these deposits before symptoms develop. Tell a health care provider about: Any allergies you have. All medicines you are taking, including vitamins, herbs, eye drops, creams, and over-the-counter medicines. Any problems you or family members have had with anesthetic medicines. Any blood disorders you have. Any surgeries you have had. Any medical conditions you have. Whether you are pregnant or may be pregnant. What are the risks? Generally, this is a safe procedure. However, problems may occur, including: Harm to a pregnant woman and her unborn baby. This test involves the use of radiation. Radiation exposure can be dangerous to a pregnant woman and her unborn baby. If you are pregnant, you generally should not have this procedure done. Slight increase in the risk of cancer. This is because of the radiation involved in the test. What happens before the procedure? No preparation is needed for this procedure. What happens during the procedure? You will undress and remove any jewelry around your neck or chest. You will put on a hospital gown. Sticky electrodes will be placed on your chest. The electrodes will be connected to an electrocardiogram (ECG) machine to record a tracing of the electrical activity of your heart. A CT scanner will take pictures  of your heart. During this time, you will be asked to lie still and hold your breath for 2-3 seconds while a picture of your heart is being taken. The procedure may vary among health care providers and hospitals. What happens after the procedure? You can get dressed. You can return to your normal activities. It is up to you to get the results of your test. Ask your health care provider, or the department that is doing the test, when your results will be ready. Summary A coronary calcium  scan is an imaging test used to look for deposits of calcium  and other fatty materials (plaques) in the inner lining of the blood vessels of the heart (coronary arteries). Generally, this is a safe procedure. Tell your health care provider if you are pregnant or may be pregnant. No preparation is needed for this procedure. A CT scanner will take pictures of your heart. You can return to your normal activities after the scan is done. This information is not intended to replace advice given to you by your health care provider. Make sure you discuss any questions you have with your health care provider. Document Released: 09/02/2007 Document Revised: 01/24/2016 Document Reviewed: 01/24/2016 Elsevier Interactive Patient Education  2017 [employer].   Your physician recommends that you schedule a follow-up appointment in: 1 year

## 2023-07-20 NOTE — Progress Notes (Signed)
  Echocardiogram 2D Echocardiogram has been performed.  Jerome Barnett 07/20/2023, 8:35 AM

## 2023-07-29 ENCOUNTER — Other Ambulatory Visit: Payer: Self-pay | Admitting: Family Medicine

## 2023-07-29 DIAGNOSIS — E785 Hyperlipidemia, unspecified: Secondary | ICD-10-CM

## 2023-07-30 ENCOUNTER — Other Ambulatory Visit (HOSPITAL_COMMUNITY): Payer: Self-pay

## 2023-07-30 ENCOUNTER — Other Ambulatory Visit: Payer: Self-pay

## 2023-07-30 MED ORDER — ATORVASTATIN CALCIUM 80 MG PO TABS
80.0000 mg | ORAL_TABLET | Freq: Every day | ORAL | 0 refills | Status: DC
Start: 2023-07-30 — End: 2023-08-29
  Filled 2023-07-30: qty 90, 90d supply, fill #0

## 2023-07-31 ENCOUNTER — Other Ambulatory Visit (HOSPITAL_COMMUNITY): Payer: Self-pay

## 2023-08-06 ENCOUNTER — Other Ambulatory Visit (HOSPITAL_COMMUNITY): Payer: Self-pay | Admitting: Internal Medicine

## 2023-08-06 DIAGNOSIS — I5020 Unspecified systolic (congestive) heart failure: Secondary | ICD-10-CM

## 2023-08-08 ENCOUNTER — Other Ambulatory Visit: Payer: Self-pay

## 2023-08-08 MED ORDER — ENTRESTO 24-26 MG PO TABS
1.0000 | ORAL_TABLET | Freq: Two times a day (BID) | ORAL | 0 refills | Status: DC
  Filled 2023-08-08: qty 60, 30d supply, fill #0

## 2023-08-14 ENCOUNTER — Ambulatory Visit (HOSPITAL_COMMUNITY)
Admission: RE | Admit: 2023-08-14 | Discharge: 2023-08-14 | Disposition: A | Discharge status: Home / Self Care | Payer: Self-pay | Source: Ambulatory Visit | Attending: Internal Medicine | Admitting: Internal Medicine

## 2023-08-14 DIAGNOSIS — E119 Type 2 diabetes mellitus without complications: Secondary | ICD-10-CM | POA: Insufficient documentation

## 2023-08-14 DIAGNOSIS — I5022 Chronic systolic (congestive) heart failure: Secondary | ICD-10-CM | POA: Insufficient documentation

## 2023-08-23 ENCOUNTER — Ambulatory Visit (HOSPITAL_COMMUNITY): Payer: Self-pay | Admitting: Internal Medicine

## 2023-08-29 ENCOUNTER — Other Ambulatory Visit: Payer: Self-pay

## 2023-08-29 ENCOUNTER — Other Ambulatory Visit (HOSPITAL_COMMUNITY): Payer: Self-pay

## 2023-08-29 MED ORDER — ROSUVASTATIN CALCIUM 40 MG PO TABS
40.0000 mg | ORAL_TABLET | Freq: Every day | ORAL | 3 refills | Status: AC
  Filled 2023-08-29: qty 90, 90d supply, fill #0
  Filled 2023-12-03 – 2023-12-18 (×2): qty 90, 90d supply, fill #1

## 2023-08-29 NOTE — Telephone Encounter (Addendum)
 Pt aware, agreeable, and verbalized understanding  Rx sent  ----- Message from Jules Oar sent at 08/23/2023  1:16 PM EDT ----- Calcium  score elevated. Need to get LDL < 70. Please switch atorva to crestor 40

## 2023-09-04 ENCOUNTER — Other Ambulatory Visit: Payer: Self-pay

## 2023-09-04 ENCOUNTER — Other Ambulatory Visit: Payer: Self-pay | Admitting: Family Medicine

## 2023-09-12 ENCOUNTER — Other Ambulatory Visit (HOSPITAL_COMMUNITY): Payer: Self-pay | Admitting: Internal Medicine

## 2023-09-12 ENCOUNTER — Other Ambulatory Visit: Payer: Self-pay

## 2023-09-12 ENCOUNTER — Other Ambulatory Visit (HOSPITAL_COMMUNITY): Payer: Self-pay

## 2023-09-12 DIAGNOSIS — I5020 Unspecified systolic (congestive) heart failure: Secondary | ICD-10-CM

## 2023-09-12 MED ORDER — CARVEDILOL 12.5 MG PO TABS
12.5000 mg | ORAL_TABLET | Freq: Two times a day (BID) | ORAL | 0 refills | Status: DC
  Filled 2023-09-12: qty 180, 90d supply, fill #0

## 2023-09-17 ENCOUNTER — Other Ambulatory Visit (HOSPITAL_COMMUNITY): Payer: Self-pay

## 2023-09-17 MED ORDER — ENTRESTO 24-26 MG PO TABS
1.0000 | ORAL_TABLET | Freq: Two times a day (BID) | ORAL | 0 refills | Status: DC
  Filled 2023-09-17: qty 60, 30d supply, fill #0

## 2023-09-23 ENCOUNTER — Other Ambulatory Visit: Payer: Self-pay | Admitting: Family Medicine

## 2023-09-23 ENCOUNTER — Other Ambulatory Visit (HOSPITAL_COMMUNITY): Payer: Self-pay | Admitting: Internal Medicine

## 2023-09-23 DIAGNOSIS — R7303 Prediabetes: Secondary | ICD-10-CM

## 2023-09-23 DIAGNOSIS — I5020 Unspecified systolic (congestive) heart failure: Secondary | ICD-10-CM

## 2023-09-23 DIAGNOSIS — I10 Essential (primary) hypertension: Secondary | ICD-10-CM

## 2023-09-23 DIAGNOSIS — K219 Gastro-esophageal reflux disease without esophagitis: Secondary | ICD-10-CM

## 2023-09-24 ENCOUNTER — Other Ambulatory Visit (HOSPITAL_COMMUNITY): Payer: Self-pay

## 2023-09-24 ENCOUNTER — Other Ambulatory Visit: Payer: Self-pay

## 2023-09-24 MED ORDER — METFORMIN HCL 500 MG PO TABS
500.0000 mg | ORAL_TABLET | Freq: Every day | ORAL | 0 refills | Status: AC
Start: 2023-09-24 — End: ?
  Filled 2023-09-24: qty 90, 90d supply, fill #0

## 2023-09-24 MED ORDER — SPIRONOLACTONE 25 MG PO TABS
25.0000 mg | ORAL_TABLET | Freq: Every day | ORAL | 3 refills | Status: AC
  Filled 2023-09-24: qty 90, 90d supply, fill #0
  Filled 2024-01-02: qty 90, 90d supply, fill #1
  Filled 2024-04-07: qty 90, 90d supply, fill #2

## 2023-09-24 MED ORDER — PANTOPRAZOLE SODIUM 40 MG PO TBEC
40.0000 mg | DELAYED_RELEASE_TABLET | Freq: Every day | ORAL | 0 refills | Status: DC
  Filled 2023-09-24: qty 90, 90d supply, fill #0

## 2023-09-25 ENCOUNTER — Other Ambulatory Visit: Payer: Self-pay

## 2023-10-03 ENCOUNTER — Other Ambulatory Visit (HOSPITAL_COMMUNITY): Payer: Self-pay | Admitting: Internal Medicine

## 2023-10-04 ENCOUNTER — Other Ambulatory Visit: Payer: Self-pay

## 2023-10-04 MED ORDER — EMPAGLIFLOZIN 10 MG PO TABS
10.0000 mg | ORAL_TABLET | Freq: Every day | ORAL | 3 refills | Status: DC
  Filled 2023-10-04: qty 30, 30d supply, fill #0
  Filled 2023-11-07: qty 30, 30d supply, fill #1
  Filled 2023-12-03 – 2023-12-18 (×2): qty 30, 30d supply, fill #2
  Filled 2024-02-17: qty 30, 30d supply, fill #3

## 2023-10-16 ENCOUNTER — Encounter (HOSPITAL_COMMUNITY): Payer: Self-pay

## 2023-10-16 ENCOUNTER — Emergency Department (HOSPITAL_COMMUNITY)

## 2023-10-16 ENCOUNTER — Other Ambulatory Visit: Payer: Self-pay

## 2023-10-16 ENCOUNTER — Emergency Department (HOSPITAL_COMMUNITY)
Admission: EM | Admit: 2023-10-16 | Discharge: 2023-10-17 | Discharge status: Against Medical Advice | Attending: Emergency Medicine | Admitting: Emergency Medicine

## 2023-10-16 DIAGNOSIS — Z5321 Procedure and treatment not carried out due to patient leaving prior to being seen by health care provider: Secondary | ICD-10-CM | POA: Diagnosis not present

## 2023-10-16 DIAGNOSIS — R0602 Shortness of breath: Secondary | ICD-10-CM | POA: Insufficient documentation

## 2023-10-16 DIAGNOSIS — R079 Chest pain, unspecified: Secondary | ICD-10-CM | POA: Diagnosis present

## 2023-10-16 LAB — CBC
HCT: 44.8 % (ref 39.0–52.0)
Hemoglobin: 14.9 g/dL (ref 13.0–17.0)
MCH: 29 pg (ref 26.0–34.0)
MCHC: 33.3 g/dL (ref 30.0–36.0)
MCV: 87.3 fL (ref 80.0–100.0)
Platelets: 224 K/uL (ref 150–400)
RBC: 5.13 MIL/uL (ref 4.22–5.81)
RDW: 12.4 % (ref 11.5–15.5)
WBC: 6.3 K/uL (ref 4.0–10.5)
nRBC: 0 % (ref 0.0–0.2)

## 2023-10-16 LAB — COMPREHENSIVE METABOLIC PANEL WITH GFR
ALT: 81 U/L — ABNORMAL HIGH (ref 0–44)
AST: 32 U/L (ref 15–41)
Albumin: 4.2 g/dL (ref 3.5–5.0)
Alkaline Phosphatase: 52 U/L (ref 38–126)
Anion gap: 9 (ref 5–15)
BUN: 13 mg/dL (ref 6–20)
CO2: 22 mmol/L (ref 22–32)
Calcium: 9.2 mg/dL (ref 8.9–10.3)
Chloride: 105 mmol/L (ref 98–111)
Creatinine, Ser: 1.19 mg/dL (ref 0.61–1.24)
GFR, Estimated: >60 mL/min (ref >60)
Glucose, Bld: 116 mg/dL — ABNORMAL HIGH (ref 70–99)
Potassium: 3.9 mmol/L (ref 3.5–5.1)
Sodium: 136 mmol/L (ref 135–145)
Total Bilirubin: 1.3 mg/dL — ABNORMAL HIGH (ref 0.0–1.2)
Total Protein: 7.3 g/dL (ref 6.5–8.1)

## 2023-10-16 LAB — TROPONIN I (HIGH SENSITIVITY)
Troponin I (High Sensitivity): 5 ng/L (ref <18)
Troponin I (High Sensitivity): 5 ng/L (ref <18)

## 2023-10-16 LAB — BRAIN NATRIURETIC PEPTIDE: B Natriuretic Peptide: 7.1 pg/mL (ref 0.0–100.0)

## 2023-10-16 NOTE — ED Triage Notes (Addendum)
 C/O SHOB and chest pressure/ dizziness. Sx started when pt was walking outside, pt sat in [facility] and sx got better. Similar episode on Saturday. Denies n/vd. EMS gave 324mg  of aspirin .

## 2023-10-16 NOTE — ED Provider Triage Note (Signed)
 Emergency Medicine Provider Triage Evaluation Note  Jerome Barnett , a 53 y.o. male  was evaluated in triage.  Pt complains of chest pain and shortness of breath.  Particularly worse today but has been on and off for several days.  Review of Systems  Positive: Chest pain, shortness of breath Negative: Leg swelling  Physical Exam  BP (!) 120/92 (BP Location: Right Arm)   Pulse 76   Temp 98.2 F (36.8 C) (Oral)   Resp 16   Ht 5' 7 (1.702 m)   Wt 83.9 kg   SpO2 100%   BMI 28.98 kg/m  Gen:   Awake, no distress   Resp:  Normal effort  MSK:   Moves extremities without difficulty, no edema  Medical Decision Making  Medically screening exam initiated at 1:08 PM.  Appropriate orders placed.  Jerome Barnett was informed that the remainder of the evaluation will be completed by another provider, this initial triage assessment does not replace that evaluation, and the importance of remaining in the ED until their evaluation is complete.  ECG, chest x-ray, labs ordered   Freddi Hamilton, MD 10/16/23 1309

## 2023-10-17 NOTE — ED Provider Notes (Signed)
  [place] EMERGENCY DEPARTMENT AT [facility] Provider Note   CSN: [id number] Arrival date & time: 10/16/23  1203   *This patient left the department prior to my evaluation*   Pamella Ozell LABOR, DO 10/27/23 1626

## 2023-10-17 NOTE — ED Notes (Signed)
 Pt called for bed no answer x3

## 2023-10-18 ENCOUNTER — Other Ambulatory Visit (HOSPITAL_COMMUNITY): Payer: Self-pay | Admitting: Internal Medicine

## 2023-10-18 DIAGNOSIS — I5020 Unspecified systolic (congestive) heart failure: Secondary | ICD-10-CM

## 2023-10-19 ENCOUNTER — Other Ambulatory Visit: Payer: Self-pay

## 2023-10-19 ENCOUNTER — Other Ambulatory Visit (HOSPITAL_COMMUNITY): Payer: Self-pay

## 2023-10-19 MED ORDER — SACUBITRIL-VALSARTAN 24-26 MG PO TABS
1.0000 | ORAL_TABLET | Freq: Two times a day (BID) | ORAL | 3 refills | Status: DC
  Filled 2023-10-19: qty 60, 30d supply, fill #0
  Filled 2023-11-14: qty 60, 30d supply, fill #1
  Filled 2023-12-13 – 2023-12-18 (×2): qty 60, 30d supply, fill #2
  Filled 2024-01-23: qty 60, 30d supply, fill #3

## 2023-11-08 ENCOUNTER — Other Ambulatory Visit (HOSPITAL_COMMUNITY): Payer: Self-pay

## 2023-11-15 ENCOUNTER — Other Ambulatory Visit (HOSPITAL_COMMUNITY): Payer: Self-pay

## 2023-12-04 ENCOUNTER — Other Ambulatory Visit: Payer: Self-pay

## 2023-12-04 ENCOUNTER — Other Ambulatory Visit (HOSPITAL_COMMUNITY): Payer: Self-pay

## 2023-12-05 ENCOUNTER — Other Ambulatory Visit: Payer: Self-pay

## 2023-12-13 ENCOUNTER — Other Ambulatory Visit (HOSPITAL_COMMUNITY): Payer: Self-pay

## 2023-12-13 ENCOUNTER — Other Ambulatory Visit: Payer: Self-pay | Admitting: Family Medicine

## 2023-12-13 ENCOUNTER — Encounter (HOSPITAL_COMMUNITY): Payer: Self-pay

## 2023-12-13 ENCOUNTER — Other Ambulatory Visit: Payer: Self-pay

## 2023-12-14 ENCOUNTER — Encounter (HOSPITAL_COMMUNITY): Payer: Self-pay

## 2023-12-14 ENCOUNTER — Other Ambulatory Visit (HOSPITAL_COMMUNITY): Payer: Self-pay

## 2023-12-17 ENCOUNTER — Other Ambulatory Visit (HOSPITAL_COMMUNITY): Payer: Self-pay

## 2023-12-17 ENCOUNTER — Other Ambulatory Visit: Payer: Self-pay | Admitting: Family Medicine

## 2023-12-17 NOTE — Telephone Encounter (Unsigned)
 Copied from CRM #[id number]. Topic: Clinical - Medication Refill >> Dec 17, 2023  8:33 AM Zy'onna H wrote: Medication:  carvedilol  carvedilol  (COREG ) 12.5 MG tablet  *Patient has requested to pick Rx up at the pharmacy, not by mail**   Has the patient contacted their pharmacy? Yes (Agent: If no, request that the patient contact the pharmacy for the refill. If patient does not wish to contact the pharmacy document the reason why and proceed with request.) (Agent: If yes, when and what did the pharmacy advise?)  This is the patient's preferred pharmacy:  [place] [facility] [address] [place] [address] Phone: [phone] Fax: [phone]  Is this the correct pharmacy for this prescription? Yes If no, delete pharmacy and type the correct one.   Has the prescription been filled recently? Yes  Is the patient out of the medication? Yes  Has the patient been seen for an appointment in the last year OR does the patient have an upcoming appointment? Yes  Can we respond through MyChart? Yes  Agent: Please be advised that Rx refills may take up to 3 business days. We ask that you follow-up with your pharmacy.

## 2023-12-18 ENCOUNTER — Telehealth: Payer: Self-pay | Admitting: Family Medicine

## 2023-12-18 ENCOUNTER — Other Ambulatory Visit (HOSPITAL_COMMUNITY): Payer: Self-pay

## 2023-12-18 ENCOUNTER — Other Ambulatory Visit: Payer: Self-pay

## 2023-12-18 NOTE — Telephone Encounter (Signed)
 Copied from CRM #[id number]. Topic: General - Other >> Dec 18, 2023  9:09 AM Olam RAMAN wrote: Reason for CRM: Calling for med refil stated he called 9/28 nd was told it would be approved same day. Carvedilol  12.5. advised for a refil it would take 3 b days.  CB [phone]

## 2023-12-18 NOTE — Telephone Encounter (Unsigned)
 Copied from CRM [id number]. Topic: Clinical - Medication Refill >> Dec 18, 2023  9:12 AM Jerome Barnett wrote: Calling for med refil stated he called 9/28 nd was told it would be approved same day. Carvedilol  12.5. advised for a refil it would take 3 b days.  CB [phone] Pt is out of meds and is leaving oot

## 2023-12-18 NOTE — Telephone Encounter (Signed)
 Requested medications are due for refill today.  yes  Requested medications are on the active medications list.  yes  Last refill. 09/12/2023 #180 0 rf  Future visit scheduled.   yes  Notes to clinic.  Rx was refused by provider. Please review for refill.    Requested Prescriptions  Pending Prescriptions Disp Refills   carvedilol  (COREG ) 12.5 MG tablet 180 tablet 0    Sig: Take 1 tablet (12.5 mg total) by mouth 2 (two) times daily with a meal.     Cardiovascular: Beta Blockers 3 Failed - 12/18/2023  5:01 PM      Failed - ALT in normal range and within 360 days    ALT  Date Value Ref Range Status  10/16/2023 81 (H) 0 - 44 U/L Final         Failed - Last BP in normal range    BP Readings from Last 1 Encounters:  10/17/23 (!) 122/95         Failed - Valid encounter within last 6 months    Recent Outpatient Visits           8 months ago Essential hypertension   [facility], Raguel, MD   1 year ago Prediabetes   [facility], MD   2 years ago Right arm pain   [facility], Raguel, MD   2 years ago Heart failure with reduced ejection fraction, NYHA class II [facility])   [facility], MD   2 years ago Prediabetes   [facility], Raguel, MD              Passed - Cr in normal range and within 360 days    Creatinine, Ser  Date Value Ref Range Status  10/16/2023 1.19 0.61 - 1.24 mg/dL Final         Passed - AST in normal range and within 360 days    AST  Date Value Ref Range Status  10/16/2023 32 15 - 41 U/L Final         Passed - Last Heart Rate in normal range    Pulse Readings from Last 1 Encounters:  10/17/23 79

## 2023-12-19 ENCOUNTER — Other Ambulatory Visit (HOSPITAL_COMMUNITY): Payer: Self-pay

## 2023-12-19 ENCOUNTER — Other Ambulatory Visit: Payer: Self-pay

## 2023-12-19 MED ORDER — CARVEDILOL 12.5 MG PO TABS
12.5000 mg | ORAL_TABLET | Freq: Two times a day (BID) | ORAL | 0 refills | Status: DC
  Filled 2023-12-19: qty 180, 90d supply, fill #0

## 2023-12-19 NOTE — Telephone Encounter (Signed)
 Pt scheduled

## 2023-12-19 NOTE — Telephone Encounter (Signed)
 Called pt to schedule a sooner appt for med refill; pt refused to schedule available appt tomorrow since he is out of town. I advised pt he needs to come in the office to get refills since last OV was in January. Pt is upset and wanting to know if he can get refill to hold him until his next appt. Pt would not let me schedule a sooner appt than his November appt and wanted me to ask for medication refill to hold him first. Pt stated he has ran out of his medication already and wants to know if he will have any effects for being off of it. Please advise.

## 2024-01-02 ENCOUNTER — Other Ambulatory Visit: Payer: Self-pay | Admitting: Family Medicine

## 2024-01-02 DIAGNOSIS — K219 Gastro-esophageal reflux disease without esophagitis: Secondary | ICD-10-CM

## 2024-01-02 DIAGNOSIS — R7303 Prediabetes: Secondary | ICD-10-CM

## 2024-01-03 ENCOUNTER — Other Ambulatory Visit: Payer: Self-pay

## 2024-01-04 ENCOUNTER — Other Ambulatory Visit (HOSPITAL_COMMUNITY): Payer: Self-pay

## 2024-01-04 MED ORDER — PANTOPRAZOLE SODIUM 40 MG PO TBEC
40.0000 mg | DELAYED_RELEASE_TABLET | Freq: Every day | ORAL | 0 refills | Status: DC
  Filled 2024-01-04: qty 90, 90d supply, fill #0

## 2024-01-04 MED ORDER — METFORMIN HCL 500 MG PO TABS
500.0000 mg | ORAL_TABLET | Freq: Every day | ORAL | 0 refills | Status: DC
  Filled 2024-01-04: qty 90, 90d supply, fill #0

## 2024-01-24 ENCOUNTER — Other Ambulatory Visit: Payer: Self-pay

## 2024-02-04 ENCOUNTER — Ambulatory Visit: Attending: Family Medicine

## 2024-02-04 ENCOUNTER — Ambulatory Visit: Admitting: Family Medicine

## 2024-02-04 VITALS — BP 128/86 | HR 78 | Ht 67.0 in | Wt 187.0 lb

## 2024-02-04 DIAGNOSIS — R0602 Shortness of breath: Secondary | ICD-10-CM

## 2024-02-04 DIAGNOSIS — E785 Hyperlipidemia, unspecified: Secondary | ICD-10-CM

## 2024-02-04 DIAGNOSIS — I10 Essential (primary) hypertension: Secondary | ICD-10-CM

## 2024-02-04 DIAGNOSIS — R42 Dizziness and giddiness: Secondary | ICD-10-CM | POA: Diagnosis not present

## 2024-02-04 NOTE — Progress Notes (Unsigned)
 Established Patient Office Visit  Subjective    Patient ID: Jerome Barnett, male    DOB: [birth year]  Age: 53 y.o. MRN: [id number]  CC:  Chief Complaint  Patient presents with   Medical Management of Chronic Issues    Pt reports labored breathiing     HPI Jerome Barnett presents for routine follow up of hypertension. Patient reports that he has been having dizziness and intermittent SOB for the past couple of months. He denies fever/chills or viral sx. Symptoms are not associated with activity.   Outpatient Encounter Medications as of 02/04/2024  Medication Sig   aspirin  81 MG chewable tablet One pill by mouth daily   carvedilol  (COREG ) 12.5 MG tablet Take 1 tablet (12.5 mg total) by mouth 2 (two) times daily with a meal.   empagliflozin  (JARDIANCE ) 10 MG TABS tablet Take 1 tablet (10 mg total) by mouth daily before breakfast.   metFORMIN  (GLUCOPHAGE ) 500 MG tablet Take 1 tablet (500 mg total) by mouth daily with breakfast.   pantoprazole  (PROTONIX ) 40 MG tablet Take 1 tablet (40 mg total) by mouth daily.   rosuvastatin  (CRESTOR ) 40 MG tablet Take 1 tablet (40 mg total) by mouth daily.   sacubitril -valsartan  (ENTRESTO ) 24-26 MG Take 1 tablet by mouth 2 (two) times daily.   Cetirizine HCl (ZYRTEC ALLERGY PO) Take 1 tablet by mouth daily as needed.   spironolactone  (ALDACTONE ) 25 MG tablet Take 1 tablet (25 mg total) by mouth daily.   No facility-administered encounter medications on file as of 02/04/2024.    Past Medical History:  Diagnosis Date   CHF (congestive heart failure) (HCC)     Past Surgical History:  Procedure Laterality Date   CARDIAC CATHETERIZATION N/A 07/05/2015   Procedure: Right/Left Heart Cath and Coronary Angiography;  Surgeon: Toribio JONELLE Fuel, MD;  Location: [facility] INVASIVE CV LAB;  Service: Cardiovascular;  Laterality: N/A;    Family History  Problem Relation Age of Onset   Hyperlipidemia Mother    Hypertension Father    Hyperlipidemia Father     Irritable bowel syndrome Father     Social History   Socioeconomic History   Marital status: Divorced    Spouse name: Not on file   Number of children: Not on file   Years of education: Not on file   Highest education level: Bachelor's degree (e.g., BA, AB, BS)  Occupational History   Not on file  Tobacco Use   Smoking status: Former    Current packs/day: 0.00    Average packs/day: 2.0 packs/day for 20.0 years (40.0 ttl pk-yrs)    Types: Cigarettes    Start date: 03/20/1992    Quit date: 03/20/2012    Years since quitting: 11.8   Smokeless tobacco: Never  Vaping Use   Vaping status: Never Used  Substance and Sexual Activity   Alcohol use: No    Alcohol/week: 0.0 standard drinks of alcohol   Drug use: No   Sexual activity: Not on file  Other Topics Concern   Not on file  Social History Narrative   Patient is separated., no children.   Has pets   Works full time at [employer]    Social Drivers of [employer]: Low Risk  (02/03/2024)   Overall Financial Resource Strain (CARDIA)    Difficulty of Paying Living Expenses: Not hard at all  Food Insecurity: No Food Insecurity (02/03/2024)   Hunger Vital Sign    Worried About Programme Researcher, Broadcasting/film/video in  the Last Year: Never true    Ran Out of Food in the Last Year: Never true  Transportation Needs: No Transportation Needs (02/03/2024)   PRAPARE - Administrator, Civil Service (Medical): No    Lack of Transportation (Non-Medical): No  Physical Activity: Insufficiently Active (02/03/2024)   Exercise Vital Sign    Days of Exercise per Week: 1 day    Minutes of Exercise per Session: 60 min  Stress: No Stress Concern Present (02/03/2024)   [employer] of Occupational Health - Occupational Stress Questionnaire    Feeling of Stress: Not at all  Social Connections: Moderately Isolated (02/03/2024)   Social Connection and Isolation Panel    Frequency of Communication with Friends and  Family: Three times a week    Frequency of Social Gatherings with Friends and Family: More than three times a week    Attends Religious Services: 1 to 4 times per year    Active Member of [employer] or Organizations: No    Attends Engineer, Structural: Not on file    Marital Status: Divorced  Intimate Partner Violence: Not on file    Review of Systems  All other systems reviewed and are negative.       Objective    BP 128/86   Pulse 78   Ht 5' 7 (1.702 m)   Wt 187 lb (84.8 kg)   SpO2 98%   BMI 29.29 kg/m   Physical Exam Vitals and nursing note reviewed.  Constitutional:      General: He is not in acute distress. Cardiovascular:     Rate and Rhythm: Normal rate and regular rhythm.  Pulmonary:     Effort: Pulmonary effort is normal.     Breath sounds: Normal breath sounds.  Abdominal:     Palpations: Abdomen is soft.     Tenderness: There is no abdominal tenderness.  Neurological:     General: No focal deficit present.     Mental Status: He is alert and oriented to person, place, and time.         Assessment & Plan:   SOB (shortness of breath) -     LONG TERM MONITOR (3-14 DAYS); Future -     Pulmonary Visit  Dizziness and giddiness -     LONG TERM MONITOR (3-14 DAYS); Future  Essential hypertension  Hyperlipidemia, unspecified hyperlipidemia type     Return in about 3 months (around 05/06/2024) for follow up.   Tanda Raguel SQUIBB, MD

## 2024-02-04 NOTE — Progress Notes (Unsigned)
 EP to read.

## 2024-02-05 ENCOUNTER — Encounter: Payer: Self-pay | Admitting: Family Medicine

## 2024-02-18 ENCOUNTER — Other Ambulatory Visit (HOSPITAL_COMMUNITY): Payer: Self-pay

## 2024-02-25 ENCOUNTER — Other Ambulatory Visit (HOSPITAL_COMMUNITY): Payer: Self-pay | Admitting: Internal Medicine

## 2024-02-25 DIAGNOSIS — I5020 Unspecified systolic (congestive) heart failure: Secondary | ICD-10-CM

## 2024-02-26 ENCOUNTER — Other Ambulatory Visit (HOSPITAL_COMMUNITY): Payer: Self-pay

## 2024-02-26 ENCOUNTER — Other Ambulatory Visit: Payer: Self-pay

## 2024-02-26 MED ORDER — SACUBITRIL-VALSARTAN 24-26 MG PO TABS
1.0000 | ORAL_TABLET | Freq: Two times a day (BID) | ORAL | 3 refills | Status: AC
  Filled 2024-02-26: qty 60, 30d supply, fill #0
  Filled 2024-03-27 – 2024-03-31 (×2): qty 60, 30d supply, fill #1

## 2024-03-06 ENCOUNTER — Ambulatory Visit: Payer: Self-pay | Admitting: Family Medicine

## 2024-03-06 DIAGNOSIS — R0602 Shortness of breath: Secondary | ICD-10-CM

## 2024-03-06 DIAGNOSIS — R42 Dizziness and giddiness: Secondary | ICD-10-CM

## 2024-03-27 ENCOUNTER — Other Ambulatory Visit (HOSPITAL_COMMUNITY): Payer: Self-pay | Admitting: Internal Medicine

## 2024-03-27 ENCOUNTER — Other Ambulatory Visit: Payer: Self-pay | Admitting: Family Medicine

## 2024-03-28 ENCOUNTER — Other Ambulatory Visit (HOSPITAL_COMMUNITY): Payer: Self-pay

## 2024-03-28 ENCOUNTER — Other Ambulatory Visit: Payer: Self-pay

## 2024-03-28 MED ORDER — CARVEDILOL 12.5 MG PO TABS
12.5000 mg | ORAL_TABLET | Freq: Two times a day (BID) | ORAL | 0 refills | Status: AC
  Filled 2024-03-28 – 2024-03-31 (×2): qty 180, 90d supply, fill #0

## 2024-03-28 MED ORDER — EMPAGLIFLOZIN 10 MG PO TABS
10.0000 mg | ORAL_TABLET | Freq: Every day | ORAL | 3 refills | Status: AC
  Filled 2024-03-28 – 2024-03-31 (×2): qty 30, 30d supply, fill #0

## 2024-03-31 ENCOUNTER — Other Ambulatory Visit: Payer: Self-pay

## 2024-04-01 ENCOUNTER — Other Ambulatory Visit (HOSPITAL_COMMUNITY): Payer: Self-pay

## 2024-04-01 ENCOUNTER — Other Ambulatory Visit: Payer: Self-pay

## 2024-04-03 ENCOUNTER — Other Ambulatory Visit (HOSPITAL_COMMUNITY): Payer: Self-pay

## 2024-04-07 ENCOUNTER — Other Ambulatory Visit: Payer: Self-pay | Admitting: Family Medicine

## 2024-04-07 DIAGNOSIS — R7303 Prediabetes: Secondary | ICD-10-CM

## 2024-04-07 DIAGNOSIS — K219 Gastro-esophageal reflux disease without esophagitis: Secondary | ICD-10-CM

## 2024-04-08 ENCOUNTER — Other Ambulatory Visit (HOSPITAL_COMMUNITY): Payer: Self-pay

## 2024-04-08 ENCOUNTER — Other Ambulatory Visit: Payer: Self-pay

## 2024-04-08 MED ORDER — PANTOPRAZOLE SODIUM 40 MG PO TBEC
40.0000 mg | DELAYED_RELEASE_TABLET | Freq: Every day | ORAL | 0 refills | Status: AC
  Filled 2024-04-08: qty 90, 90d supply, fill #0

## 2024-04-08 MED ORDER — METFORMIN HCL 500 MG PO TABS
500.0000 mg | ORAL_TABLET | Freq: Every day | ORAL | 0 refills | Status: AC
  Filled 2024-04-08: qty 90, 90d supply, fill #0
